# Patient Record
Sex: Male | Born: 1935 | Race: White | Hispanic: No | Marital: Married | Smoking: Former smoker
Health system: Southern US, Community
[De-identification: ages and names within clinical notes are randomized; demographics above are authoritative.]

## PROBLEM LIST (undated history)

## (undated) ENCOUNTER — Emergency Department (HOSPITAL_COMMUNITY): Admission: EM | Payer: BC Managed Care – PPO | Source: Home / Self Care

## (undated) DIAGNOSIS — L723 Sebaceous cyst: Secondary | ICD-10-CM

## (undated) DIAGNOSIS — G319 Degenerative disease of nervous system, unspecified: Secondary | ICD-10-CM

## (undated) DIAGNOSIS — I10 Essential (primary) hypertension: Secondary | ICD-10-CM

## (undated) DIAGNOSIS — R9431 Abnormal electrocardiogram [ECG] [EKG]: Secondary | ICD-10-CM

## (undated) DIAGNOSIS — R410 Disorientation, unspecified: Secondary | ICD-10-CM

## (undated) DIAGNOSIS — Z950 Presence of cardiac pacemaker: Secondary | ICD-10-CM

## (undated) DIAGNOSIS — I509 Heart failure, unspecified: Secondary | ICD-10-CM

## (undated) DIAGNOSIS — K5900 Constipation, unspecified: Secondary | ICD-10-CM

## (undated) DIAGNOSIS — C61 Malignant neoplasm of prostate: Secondary | ICD-10-CM

## (undated) DIAGNOSIS — A419 Sepsis, unspecified organism: Secondary | ICD-10-CM

## (undated) DIAGNOSIS — I679 Cerebrovascular disease, unspecified: Secondary | ICD-10-CM

## (undated) DIAGNOSIS — R1310 Dysphagia, unspecified: Secondary | ICD-10-CM

## (undated) DIAGNOSIS — R269 Unspecified abnormalities of gait and mobility: Secondary | ICD-10-CM

## (undated) DIAGNOSIS — I480 Paroxysmal atrial fibrillation: Secondary | ICD-10-CM

## (undated) DIAGNOSIS — H9190 Unspecified hearing loss, unspecified ear: Secondary | ICD-10-CM

## (undated) DIAGNOSIS — L919 Hypertrophic disorder of the skin, unspecified: Secondary | ICD-10-CM

## (undated) DIAGNOSIS — R5383 Other fatigue: Secondary | ICD-10-CM

## (undated) DIAGNOSIS — G629 Polyneuropathy, unspecified: Secondary | ICD-10-CM

## (undated) DIAGNOSIS — N390 Urinary tract infection, site not specified: Secondary | ICD-10-CM

## (undated) DIAGNOSIS — K648 Other hemorrhoids: Secondary | ICD-10-CM

## (undated) DIAGNOSIS — K5790 Diverticulosis of intestine, part unspecified, without perforation or abscess without bleeding: Secondary | ICD-10-CM

## (undated) DIAGNOSIS — L909 Atrophic disorder of skin, unspecified: Secondary | ICD-10-CM

## (undated) DIAGNOSIS — M4800 Spinal stenosis, site unspecified: Secondary | ICD-10-CM

## (undated) DIAGNOSIS — I359 Nonrheumatic aortic valve disorder, unspecified: Secondary | ICD-10-CM

## (undated) DIAGNOSIS — I442 Atrioventricular block, complete: Secondary | ICD-10-CM

## (undated) DIAGNOSIS — R5381 Other malaise: Secondary | ICD-10-CM

## (undated) DIAGNOSIS — D638 Anemia in other chronic diseases classified elsewhere: Secondary | ICD-10-CM

## (undated) DIAGNOSIS — M1990 Unspecified osteoarthritis, unspecified site: Secondary | ICD-10-CM

## (undated) HISTORY — DX: Anemia in other chronic diseases classified elsewhere: D63.8

## (undated) HISTORY — DX: Dysphagia, unspecified: R13.10

## (undated) HISTORY — DX: Essential (primary) hypertension: I10

## (undated) HISTORY — DX: Heart failure, unspecified: I50.9

## (undated) HISTORY — DX: Sepsis, unspecified organism: A41.9

## (undated) HISTORY — DX: Urinary tract infection, site not specified: N39.0

## (undated) HISTORY — DX: Atrioventricular block, complete: I44.2

## (undated) HISTORY — PX: TONSILLECTOMY: SHX5217

## (undated) HISTORY — DX: Unspecified abnormalities of gait and mobility: R26.9

## (undated) HISTORY — DX: Constipation, unspecified: K59.00

## (undated) HISTORY — DX: Cerebrovascular disease, unspecified: I67.9

## (undated) HISTORY — DX: Degenerative disease of nervous system, unspecified: G31.9

## (undated) HISTORY — DX: Malignant neoplasm of prostate: C61

## (undated) HISTORY — DX: Nonrheumatic aortic valve disorder, unspecified: I35.9

## (undated) HISTORY — PX: NASAL POLYP EXCISION: SHX2068

## (undated) HISTORY — DX: Disorientation, unspecified: R41.0

## (undated) HISTORY — DX: Hypertrophic disorder of the skin, unspecified: L91.9

## (undated) HISTORY — DX: Unspecified hearing loss, unspecified ear: H91.90

## (undated) HISTORY — DX: Unspecified osteoarthritis, unspecified site: M19.90

## (undated) HISTORY — DX: Spinal stenosis, site unspecified: M48.00

## (undated) HISTORY — DX: Polyneuropathy, unspecified: G62.9

## (undated) HISTORY — DX: Sebaceous cyst: L72.3

## (undated) HISTORY — DX: Other hemorrhoids: K64.8

## (undated) HISTORY — DX: Other malaise: R53.81

## (undated) HISTORY — DX: Diverticulosis of intestine, part unspecified, without perforation or abscess without bleeding: K57.90

## (undated) HISTORY — DX: Other fatigue: R53.83

## (undated) HISTORY — DX: Atrophic disorder of skin, unspecified: L90.9

## (undated) HISTORY — DX: Paroxysmal atrial fibrillation: I48.0

## (undated) HISTORY — DX: Abnormal electrocardiogram (ECG) (EKG): R94.31

---

## 1979-06-04 DIAGNOSIS — I359 Nonrheumatic aortic valve disorder, unspecified: Secondary | ICD-10-CM

## 1979-06-04 HISTORY — DX: Nonrheumatic aortic valve disorder, unspecified: I35.9

## 1998-10-23 HISTORY — PX: BACK SURGERY: SHX140

## 1999-01-12 ENCOUNTER — Inpatient Hospital Stay (HOSPITAL_COMMUNITY): Admission: RE | Admit: 1999-01-12 | Discharge: 1999-01-13 | Payer: Self-pay | Admitting: Orthopedic Surgery

## 1999-01-12 ENCOUNTER — Encounter: Payer: Self-pay | Admitting: Orthopedic Surgery

## 1999-06-04 DIAGNOSIS — R269 Unspecified abnormalities of gait and mobility: Secondary | ICD-10-CM

## 1999-06-04 HISTORY — DX: Unspecified abnormalities of gait and mobility: R26.9

## 2001-04-29 ENCOUNTER — Other Ambulatory Visit: Admission: RE | Admit: 2001-04-29 | Discharge: 2001-04-29 | Payer: Self-pay | Admitting: Urology

## 2001-04-29 ENCOUNTER — Encounter (INDEPENDENT_AMBULATORY_CARE_PROVIDER_SITE_OTHER): Payer: Self-pay | Admitting: Specialist

## 2001-05-02 ENCOUNTER — Ambulatory Visit: Admission: RE | Admit: 2001-05-02 | Discharge: 2001-07-31 | Payer: Self-pay | Admitting: Radiation Oncology

## 2001-05-06 ENCOUNTER — Encounter: Payer: Self-pay | Admitting: Urology

## 2001-05-06 ENCOUNTER — Encounter: Admission: RE | Admit: 2001-05-06 | Discharge: 2001-05-06 | Payer: Self-pay | Admitting: Urology

## 2001-05-10 ENCOUNTER — Encounter: Admission: RE | Admit: 2001-05-10 | Discharge: 2001-05-10 | Payer: Self-pay | Admitting: Urology

## 2001-05-10 ENCOUNTER — Encounter: Payer: Self-pay | Admitting: Urology

## 2001-08-01 ENCOUNTER — Ambulatory Visit: Admission: RE | Admit: 2001-08-01 | Discharge: 2001-10-30 | Payer: Self-pay | Admitting: Radiation Oncology

## 2004-07-29 ENCOUNTER — Emergency Department (HOSPITAL_COMMUNITY): Admission: EM | Admit: 2004-07-29 | Discharge: 2004-07-29 | Payer: Self-pay | Admitting: Emergency Medicine

## 2004-08-04 ENCOUNTER — Ambulatory Visit (HOSPITAL_COMMUNITY): Admission: RE | Admit: 2004-08-04 | Discharge: 2004-08-04 | Payer: Self-pay | Admitting: *Deleted

## 2004-08-04 ENCOUNTER — Encounter (INDEPENDENT_AMBULATORY_CARE_PROVIDER_SITE_OTHER): Payer: Self-pay | Admitting: *Deleted

## 2007-02-19 DIAGNOSIS — M1990 Unspecified osteoarthritis, unspecified site: Secondary | ICD-10-CM

## 2007-02-19 DIAGNOSIS — H9190 Unspecified hearing loss, unspecified ear: Secondary | ICD-10-CM

## 2007-02-19 HISTORY — DX: Unspecified hearing loss, unspecified ear: H91.90

## 2007-02-19 HISTORY — DX: Unspecified osteoarthritis, unspecified site: M19.90

## 2007-06-03 DIAGNOSIS — K648 Other hemorrhoids: Secondary | ICD-10-CM

## 2007-06-03 HISTORY — DX: Other hemorrhoids: K64.8

## 2007-07-25 ENCOUNTER — Ambulatory Visit (HOSPITAL_COMMUNITY): Admission: RE | Admit: 2007-07-25 | Discharge: 2007-07-25 | Payer: Self-pay | Admitting: *Deleted

## 2007-07-25 HISTORY — PX: COLONOSCOPY: SHX174

## 2008-01-28 DIAGNOSIS — R9431 Abnormal electrocardiogram [ECG] [EKG]: Secondary | ICD-10-CM

## 2008-01-28 DIAGNOSIS — R5381 Other malaise: Secondary | ICD-10-CM

## 2008-01-28 HISTORY — DX: Abnormal electrocardiogram (ECG) (EKG): R94.31

## 2008-01-28 HISTORY — DX: Other malaise: R53.81

## 2009-10-23 HISTORY — PX: CATARACT EXTRACTION W/ INTRAOCULAR LENS  IMPLANT, BILATERAL: SHX1307

## 2010-01-03 ENCOUNTER — Inpatient Hospital Stay (HOSPITAL_COMMUNITY): Admission: EM | Admit: 2010-01-03 | Discharge: 2010-01-06 | Payer: Self-pay | Admitting: Emergency Medicine

## 2010-01-03 ENCOUNTER — Ambulatory Visit: Payer: Self-pay | Admitting: Cardiology

## 2010-01-03 ENCOUNTER — Encounter: Payer: Self-pay | Admitting: Cardiology

## 2010-01-04 HISTORY — PX: PACEMAKER INSERTION: SHX728

## 2010-01-05 HISTORY — PX: PACEMAKER PLACEMENT: SHX43

## 2010-01-06 ENCOUNTER — Encounter: Payer: Self-pay | Admitting: Internal Medicine

## 2010-01-17 ENCOUNTER — Encounter: Payer: Self-pay | Admitting: Internal Medicine

## 2010-01-17 ENCOUNTER — Telehealth: Payer: Self-pay | Admitting: Internal Medicine

## 2010-01-17 ENCOUNTER — Ambulatory Visit: Payer: Self-pay

## 2010-01-20 ENCOUNTER — Encounter: Payer: Self-pay | Admitting: Cardiology

## 2010-03-01 ENCOUNTER — Encounter: Payer: Self-pay | Admitting: Cardiovascular Disease

## 2010-04-11 ENCOUNTER — Telehealth: Payer: Self-pay | Admitting: Internal Medicine

## 2010-04-11 ENCOUNTER — Ambulatory Visit: Payer: Self-pay | Admitting: Internal Medicine

## 2010-04-11 DIAGNOSIS — I10 Essential (primary) hypertension: Secondary | ICD-10-CM

## 2010-11-22 NOTE — Miscellaneous (Signed)
Summary: Plan of Care  Plan of Care   Imported By: Harlon Flor 03/09/2010 10:41:10  _____________________________________________________________________  External Attachment:    Type:   Image     Comment:   External Document

## 2010-11-22 NOTE — Cardiovascular Report (Signed)
Summary: Office Visit   Office Visit   Imported By: Roderic Ovens 04/15/2010 09:54:54  _____________________________________________________________________  External Attachment:    Type:   Image     Comment:   External Document

## 2010-11-22 NOTE — Assessment & Plan Note (Signed)
Summary: PC2/ GD   Visit Type:  new pt visit Primary Provider:  Murray Hodgkins   History of Present Illness: The patient presents today for routine electrophysiology followup. He reports doing very well since having his pacemaker implanted.  His energy has significantly improved. The patient denies symptoms of palpitations, chest pain, shortness of breath, orthopnea, PND, lower extremity edema, dizziness, presyncope, syncope, or neurologic sequela. The patient is tolerating medications without difficulties and is otherwise without complaint today.   Current Medications (verified): 1)  Amlodipine Besylate 5 Mg Tabs (Amlodipine Besylate) .Marland Kitchen.. 1 Tab Once Daily 2)  Gabapentin 300 Mg Caps (Gabapentin) .Marland Kitchen.. 1 Tab At Bedtime As Needed 3)  Benicar Hct 40-12.5 Mg Tabs (Olmesartan Medoxomil-Hctz) .Marland Kitchen.. 1 Tab Once Daily 4)  Doxazosin Mesylate 2 Mg Tabs (Doxazosin Mesylate) .Marland Kitchen.. 1 Tab Once Daily 5)  Calcium-Vitamin D 600-200 Mg-Unit Tabs (Calcium-Vitamin D) .Marland Kitchen.. 1 Tab Once Daily 6)  Triple Flex Bone & Joint 750-600-100 Mg Tabs (Glucosamine-Chondroit-Calcium) .Marland Kitchen.. 1 Tab 600-400mg  Once Daily 7)  Clobetasol Propionate 0.05 % Crea (Clobetasol Propionate) .... As Needed For Rash 8)  Vitamin D 1000 Unit Tabs (Cholecalciferol) .Marland Kitchen.. 1 Tab Once Daily 9)  Meloxicam 7.5 Mg Tabs (Meloxicam) .Marland Kitchen.. 1 Tab Once Daily As Needed  Allergies (verified): 1)  ! * Resperine 2)  ! Inderal 3)  ! * Hctz 4)  ! Monopril 5)  ! Accupril 6)  ! * Altace 7)  ! * Mobic  Past History:  Past Medical History:  1. Hypertension.   2. History of prostate cancer.   3. Spinal stenosis.   4. Peripheral neuropathy.   5. Diverticulosis and internal hemorrhoids.   6. Mobitz II av block s/p PPM  7. History of vitamin D deficiency.      Past Surgical History: PPM 3/11  Family History: CAD  Social History: He lives in friend's home with his wife.  He was a   Banker at [employer], but is now retired.  He quit   tobacco in 1989 with approximately 40-pack-year history and denies alcohol or drug abuse.  He exercises 3 times a week at exercise classes.   Vital Signs:  Patient profile:   77 year old male Height:      69.5 inches Weight:      207 pounds BMI:     30.24 Pulse rate:   74 / minute Pulse rhythm:   irregular BP sitting:   98 / 64  (left arm) Cuff size:   regular  Vitals Entered By: Danielle Rankin, CMA (April 11, 2010 10:29 AM)  Physical Exam  General:  elderly, NAD Head:  normocephalic and atraumatic Eyes:  PERRLA/EOM intact; conjunctiva and lids normal. Mouth:  Teeth, gums and palate normal. Oral mucosa normal. Neck:  supple Chest Wall:  pacemaker pocket is well healed Lungs:  Clear bilaterally to auscultation and percussion. Heart:  RRR, no m/r/g Abdomen:  Bowel sounds positive; abdomen soft and non-tender without masses, organomegaly, or hernias noted. No hepatosplenomegaly. Msk:  Back normal, normal gait. Muscle strength and tone normal. Pulses:  pulses normal in all 4 extremities Extremities:  No clubbing or cyanosis. Neurologic:  Alert and oriented x 3.   EKG  Procedure date:  04/11/2010  Findings:      sinus rhythm with PACs,  V paced on demand  PPM Specifications Following MD:  Hillis Range, MD     PPM Vendor:  St Jude     PPM Model Number:  [id number]  PPM Serial Number:  [id number] PPM DOI:  01/04/2010     PPM Implanting MD:  Hillis Range, MD  Lead 1    Location: RV     DOI: 01/04/2010     Model #: 1948     Serial #: [id number]     Status: active Lead 2    Location: RA     DOI: 01/04/2010     Model #: 1888TC     Serial #: [id number]     Status: active  Magnet Response Rate:  BOL 100 ERI  85    PPM Follow Up Pacer Dependent:  No      Parameters Mode:  DDD     Lower Rate Limit:  60     Upper Rate Limit:  110 Paced AV Delay:  180     Sensed AV Delay:  160 MD Comments:  agree  Impression & Recommendations:  Problem # 1:  MOBITZ II ATRIOVENTRICULAR BLOCK  (ICD-426.12) normal pacemaker function V threshold has increased but appears stable Ventricular autocapture turned on today  Problem # 2:  ESSENTIAL HYPERTENSION, BENIGN (ICD-401.1) stable no changes today  Patient Instructions: 1)  Your physician recommends that you schedule a follow-up appointment in: March of 2012 2)  Your physician recommends that you continue on your current medications as directed. Please refer to the Current Medication list given to you today.

## 2010-11-22 NOTE — Procedures (Signed)
Summary: [facility]   Allergies: 1)  ! * Resperine 2)  ! Inderal 3)  ! * Hctz 4)  ! Monopril 5)  ! Accupril 6)  ! * Altace 7)  ! * Mobic  PPM Specifications Following MD:  Hillis Range, MD     PPM Vendor:  St Jude     PPM Model Number:  [phone]     PPM Serial Number:  [id number] PPM DOI:  01/04/2010     PPM Implanting MD:  Hillis Range, MD  Lead 1    Location: RV     DOI: 01/04/2010     Model #: 1948     Serial #: [id number]     Status: active Lead 2    Location: RA     DOI: 01/04/2010     Model #: 1888TC     Serial #: [id number]     Status: active  Magnet Response Rate:  BOL 100 ERI  85    PPM Follow Up Battery Voltage:  2.96 V     Battery Est. Longevity:  7.5-8.25yrs     Pacer Dependent:  No       PPM Device Measurements Atrium  Amplitude: 5.0 mV, Impedance: 550 ohms, Threshold: 0.75 V at 0.5 msec Right Ventricle  Amplitude: 7.8 mV, Impedance: 650 ohms, Threshold: 1.75 V at 0.5 msec  Episodes MS Episodes:  31     Percent Mode Switch:  <1%     Ventricular High Rate:  0     Atrial Pacing:  40%     Ventricular Pacing:  92%  Parameters Mode:  DDD     Lower Rate Limit:  60     Upper Rate Limit:  110 Paced AV Delay:  180     Sensed AV Delay:  160 Tech Comments:  31 AMS EPISODES--LONGEST WAS 40 SECONDS.  RV THRESHOLD 1.875 @ 0.28ms. CHANGES: A AMPLITUDE 2.0 V AND TURNED RATE RESPONSE ON DURING MODE SWITCH.  ROV IN 6 MTHS. Vella Kohler  April 11, 2010 11:37 AM

## 2010-11-22 NOTE — Letter (Signed)
Summary: Discharge Summary  Discharge Summary   Imported By: Harlon Flor 02/08/2010 11:16:51  _____________________________________________________________________  External Attachment:    Type:   Image     Comment:   External Document

## 2010-11-22 NOTE — Miscellaneous (Signed)
Summary: Device preload  Clinical Lists Changes  Observations: Added new observation of MAGNET RTE: BOL 100 ERI  85 (01/06/2010 11:49) Added new observation of PPMLEADSTAT2: active (01/06/2010 11:49) Added new observation of PPMLEADSER2: [id number] (01/06/2010 11:49) Added new observation of PPMLEADMOD2: 1888TC (01/06/2010 11:49) Added new observation of PPMLEADLOC2: RA (01/06/2010 11:49) Added new observation of PPMLEADSTAT1: active (01/06/2010 11:49) Added new observation of PPMLEADSER1: [id number] (01/06/2010 11:49) Added new observation of PPMLEADMOD1: 1948  (01/06/2010 11:49) Added new observation of PPMLEADLOC1: RV  (01/06/2010 11:49) Added new observation of PPM IMP MD: Hillis Range, MD  (01/06/2010 11:49) Added new observation of PPMLEADDOI2: 01/04/2010  (01/06/2010 11:49) Added new observation of PPMLEADDOI1: 01/04/2010  (01/06/2010 11:49) Added new observation of PPM DOI: 01/04/2010  (01/06/2010 11:49) Added new observation of PPM SERL#: [id number]  (01/06/2010 11:49) Added new observation of PPM MODL#: [id number]  (01/06/2010 78:46) Added new observation of PACEMAKERMFG: St Jude  (01/06/2010 11:49) Added new observation of PACEMAKER MD: Hillis Range, MD  (01/06/2010 11:49)      PPM Specifications Following MD:  Hillis Range, MD     PPM Vendor:  St Jude     PPM Model Number:  [id number]     PPM Serial Number:  [id number] PPM DOI:  01/04/2010     PPM Implanting MD:  Hillis Range, MD  Lead 1    Location: RV     DOI: 01/04/2010     Model #: 1948     Serial #: [id number]     Status: active Lead 2    Location: RA     DOI: 01/04/2010     Model #: 1888TC     Serial #: [id number]     Status: active  Magnet Response Rate:  BOL 100 ERI  85

## 2010-11-22 NOTE — Progress Notes (Signed)
Summary: medication down wrong in chart  Phone Note Call from Patient   Caller: Daughter Reason for Call: Talk to Nurse Summary of Call: dtr calling re appt today-medication is wrong on the gabapentin it should be 100mg  not 300mg  Initial call taken by: Glynda Jaeger,  April 11, 2010 12:28 PM    New/Updated Medications: GABAPENTIN 100 MG CAPS (GABAPENTIN)

## 2010-11-22 NOTE — Procedures (Signed)
Summary: [facility]/ [facility]/ GD   Allergies (verified): No Known Drug Allergies  PPM Specifications Following MD:  Hillis Range, MD     PPM Vendor:  St Jude     PPM Model Number:  [id number]     PPM Serial Number:  [id number] PPM DOI:  01/04/2010     PPM Implanting MD:  Hillis Range, MD  Lead 1    Location: RV     DOI: 01/04/2010     Model #: 1948     Serial #: [id number]     Status: active Lead 2    Location: RA     DOI: 01/04/2010     Model #: 1888TC     Serial #: [id number]     Status: active  Magnet Response Rate:  BOL 100 ERI  85    PPM Follow Up Remote Check?  No Battery Voltage:  3.20 V     Battery Est. Longevity:  6.8 years     Pacer Dependent:  No       PPM Device Measurements Atrium  Amplitude: 5.0 mV, Impedance: 380 ohms, Threshold: 0.5 V at 0.5 msec Right Ventricle  Amplitude: 5.4 mV, Impedance: 550 ohms, Threshold: 0.625 V at 0.5 msec  Episodes MS Episodes:  3     Percent Mode Switch:  <1%     Atrial Pacing:  32%     Ventricular Pacing:  94%  Parameters Mode:  DDD     Lower Rate Limit:  60     Upper Rate Limit:  110 Paced AV Delay:  180     Sensed AV Delay:  160 Tech Comments:  Steri strips removed, no redenss or edema.  Longest mode switch 10 seconds.  R-waves 8.9 @ implant, 5.4 today, impedance and threshold stable.  Unable to check medications, no list available.  ROV 3 months Dr. Johney Frame. Altha Harm, LPN  January 17, 2010 9:59 AM

## 2010-11-22 NOTE — Progress Notes (Signed)
Summary: QUESTIONA BOUT MEDICATION  Phone Note Call from Patient Call back at Home Phone [phone] Call back at [phone]   Caller: Son/RANDY Summary of Call: PT SON HAVE QUESTIONA ABOUT MEDICATION( AMLODIFINE BESYLATE) Initial call taken by: Judie Grieve,  January 17, 2010 3:35 PM  Follow-up for Phone Call        take Amlodipine until his f/u with Dr Johney Frame Dennis Bast, RN, BSN  January 17, 2010 4:42 PM

## 2010-11-22 NOTE — Cardiovascular Report (Signed)
Summary: Office Visit   Office Visit   Imported By: Roderic Ovens 01/28/2010 13:26:13  _____________________________________________________________________  External Attachment:    Type:   Image     Comment:   External Document

## 2010-12-28 ENCOUNTER — Encounter: Payer: Self-pay | Admitting: Internal Medicine

## 2010-12-28 DIAGNOSIS — G609 Hereditary and idiopathic neuropathy, unspecified: Secondary | ICD-10-CM | POA: Insufficient documentation

## 2010-12-28 DIAGNOSIS — C61 Malignant neoplasm of prostate: Secondary | ICD-10-CM | POA: Insufficient documentation

## 2010-12-28 DIAGNOSIS — K648 Other hemorrhoids: Secondary | ICD-10-CM | POA: Insufficient documentation

## 2010-12-28 DIAGNOSIS — K5730 Diverticulosis of large intestine without perforation or abscess without bleeding: Secondary | ICD-10-CM | POA: Insufficient documentation

## 2010-12-28 DIAGNOSIS — I447 Left bundle-branch block, unspecified: Secondary | ICD-10-CM | POA: Insufficient documentation

## 2010-12-28 DIAGNOSIS — M4800 Spinal stenosis, site unspecified: Secondary | ICD-10-CM | POA: Insufficient documentation

## 2010-12-28 DIAGNOSIS — E559 Vitamin D deficiency, unspecified: Secondary | ICD-10-CM | POA: Insufficient documentation

## 2010-12-29 ENCOUNTER — Encounter (INDEPENDENT_AMBULATORY_CARE_PROVIDER_SITE_OTHER): Payer: Medicare Other | Admitting: Internal Medicine

## 2010-12-29 ENCOUNTER — Encounter: Payer: Self-pay | Admitting: Internal Medicine

## 2010-12-29 DIAGNOSIS — I441 Atrioventricular block, second degree: Secondary | ICD-10-CM

## 2010-12-29 DIAGNOSIS — I10 Essential (primary) hypertension: Secondary | ICD-10-CM

## 2010-12-31 DIAGNOSIS — R011 Cardiac murmur, unspecified: Secondary | ICD-10-CM | POA: Insufficient documentation

## 2011-01-03 NOTE — Assessment & Plan Note (Signed)
Summary: device/saf/kl   Visit Type:  Follow-up Primary Provider:  Murray Hodgkins   History of Present Illness: The patient presents today for routine electrophysiology followup. He reports doing very well since having his pacemaker implanted.  He remains very active despite his age.  The patient denies symptoms of palpitations, chest pain, shortness of breath, orthopnea, PND, lower extremity edema, dizziness, presyncope, syncope, or neurologic sequela. The patient is tolerating medications without difficulties and is otherwise without complaint today.   Current Medications (verified): 1)  Gabapentin 100 Mg Caps (Gabapentin) .Marland Kitchen.. 1 By Mouth At Bedtime 2)  Benicar Hct 40-12.5 Mg Tabs (Olmesartan Medoxomil-Hctz) .Marland Kitchen.. 1 Tab Once Daily 3)  Doxazosin Mesylate 2 Mg Tabs (Doxazosin Mesylate) .Marland Kitchen.. 1 Tab Once Daily 4)  Calcium-Vitamin D 600-200 Mg-Unit Tabs (Calcium-Vitamin D) .Marland Kitchen.. 1 Tab Once Daily 5)  Clobetasol Propionate 0.05 % Crea (Clobetasol Propionate) .... As Needed For Rash 6)  Vitamin D 1000 Unit Tabs (Cholecalciferol) .Marland Kitchen.. 1 Tab Once Daily 7)  Glucosamine 500 Mg Caps (Glucosamine Sulfate) .Marland Kitchen.. 1 By Mouth Daily 8)  Aspirin 81 Mg  Tabs (Aspirin) .Marland Kitchen.. 1 By Mouth Daily  Allergies (verified): 1)  ! * Resperine 2)  ! Inderal 3)  ! * Hctz 4)  ! Monopril 5)  ! Accupril 6)  ! * Altace 7)  ! * Mobic  Past History:  Past Medical History: ESSENTIAL HYPERTENSION, BENIGN PERIPHERAL NEUROPATHY SPINAL STENOSIS PROSTATE CANCER Diverticulosis and internal hemorrhoids.  Mobitz II av block s/p PPM      Past Surgical History: Reviewed history from 12/28/2010 and no changes required. PPM 3/11  History of prostate cancer.   History of back surgery.  Colonoscopy.    Social History: Reviewed history from 04/11/2010 and no changes required. He lives in friend's home with his wife.  He was a   Banker at [employer], but is now retired.  He quit  tobacco in 1989 with  approximately 40-pack-year history and denies alcohol or drug abuse.  He exercises 3 times a week at exercise classes.   Vital Signs:  Patient profile:   77 year old male Height:      69.5 inches Weight:      200 pounds BMI:     29.22 Pulse rate:   48 / minute Resp:     18 per minute BP sitting:   118 / 64  (left arm)  Vitals Entered By: Marrion Coy, CNA (December 29, 2010 12:34 PM)  Physical Exam  General:  Well developed, well nourished, in no acute distress. Head:  normocephalic and atraumatic Eyes:  PERRLA/EOM intact; conjunctiva and lids normal. Mouth:  Teeth, gums and palate normal. Oral mucosa normal. Neck:  Neck supple, no JVD. No masses, thyromegaly or abnormal cervical nodes. Chest Wall:  pacemaker pocket is well healed Lungs:  Clear bilaterally to auscultation and percussion. Heart:  RRR, 3/6 SEM LUSB which is mid peaking Abdomen:  Bowel sounds positive; abdomen soft and non-tender without masses, organomegaly, or hernias noted. No hepatosplenomegaly. Msk:  Back normal, normal gait. Muscle strength and tone normal. Extremities:  No clubbing or cyanosis. Neurologic:  Alert and oriented x 3. Skin:  Intact without lesions or rashes. Psych:  Normal affect.   PPM Specifications Following MD:  Hillis Range, MD     PPM Vendor:  St Jude     PPM Model Number:  [id number]     PPM Serial Number:  [id number] PPM DOI:  01/04/2010     PPM Implanting MD:  Hillis Range, MD  Lead 1    Location: RV     DOI: 01/04/2010     Model #: 1948     Serial #: [id number]     Status: active Lead 2    Location: RA     DOI: 01/04/2010     Model #: 1888TC     Serial #: [id number]     Status: active  Magnet Response Rate:  BOL 100 ERI  85    PPM Follow Up Pacer Dependent:  No      Parameters Mode:  DDD     Lower Rate Limit:  60     Upper Rate Limit:  110 Paced AV Delay:  180     Sensed AV Delay:  160 MD Comments:  see scanned report in PACEART  Impression & Recommendations:  Problem # 1:  MOBITZ  II ATRIOVENTRICULAR BLOCK (ICD-426.12) normal pacemaker function V threshold is stable but chronically elevated see scanned report in paceart  Problem # 2:  ESSENTIAL HYPERTENSION, BENIGN (ICD-401.1) stable no changes  Problem # 3:  SYSTOLIC MURMUR (VHQ-469.6) by exam, the patient likely has aortic stenosis.  The mumur is not late peaking and the patient has no symptoms of aortic stenosis.  I suggested that we could perform an echocardiogram to further evaluate this.  Given his advanced age, the patient is reluctant to have an echo obtained.  As he is asymptomatic, I think that this is a reasonable approach.  He will further contemplate echo and may be willing to proceed upon next office visit.  He will alert my office if he develops symptoms of CHF, CP, or presyncope in the interim.  Patient Instructions: 1)  Your physician wants you to follow-up in: 6 months with Dr.Aleana Fifita  You will receive a reminder letter in the mail two months in advance. If you don't receive a letter, please call our office to schedule the follow-up appointment.

## 2011-01-15 LAB — CARDIAC PANEL(CRET KIN+CKTOT+MB+TROPI)
CK, MB: 1.6 ng/mL (ref 0.3–4.0)
Relative Index: INVALID (ref 0.0–2.5)
Relative Index: INVALID (ref 0.0–2.5)
Total CK: 58 U/L (ref 7–232)
Total CK: 60 U/L (ref 7–232)
Troponin I: 0.08 ng/mL — ABNORMAL HIGH (ref 0.00–0.06)

## 2011-01-15 LAB — CBC
HCT: 34.6 % — ABNORMAL LOW (ref 39.0–52.0)
Hemoglobin: 12.1 g/dL — ABNORMAL LOW (ref 13.0–17.0)
MCV: 95.6 fL (ref 78.0–100.0)
MCV: 95.8 fL (ref 78.0–100.0)
Platelets: 128 10*3/uL — ABNORMAL LOW (ref 150–400)
RBC: 3.62 MIL/uL — ABNORMAL LOW (ref 4.22–5.81)
RBC: 3.88 MIL/uL — ABNORMAL LOW (ref 4.22–5.81)
WBC: 7 10*3/uL (ref 4.0–10.5)
WBC: 8.3 10*3/uL (ref 4.0–10.5)

## 2011-01-15 LAB — POCT I-STAT, CHEM 8
BUN: 37 mg/dL — ABNORMAL HIGH (ref 6–23)
Calcium, Ion: 1.27 mmol/L (ref 1.12–1.32)
Creatinine, Ser: 1 mg/dL (ref 0.4–1.5)
Glucose, Bld: 103 mg/dL — ABNORMAL HIGH (ref 70–99)
Hemoglobin: 12.9 g/dL — ABNORMAL LOW (ref 13.0–17.0)
Sodium: 142 mEq/L (ref 135–145)
TCO2: 26 mmol/L (ref 0–100)

## 2011-01-15 LAB — BASIC METABOLIC PANEL
BUN: 29 mg/dL — ABNORMAL HIGH (ref 6–23)
CO2: 25 mEq/L (ref 19–32)
CO2: 26 mEq/L (ref 19–32)
Chloride: 109 mEq/L (ref 96–112)
Creatinine, Ser: 1 mg/dL (ref 0.4–1.5)
GFR calc Af Amer: >60 mL/min (ref >60)
Glucose, Bld: 101 mg/dL — ABNORMAL HIGH (ref 70–99)
Glucose, Bld: 95 mg/dL (ref 70–99)
Potassium: 4 mEq/L (ref 3.5–5.1)
Sodium: 140 mEq/L (ref 135–145)
Sodium: 142 mEq/L (ref 135–145)

## 2011-01-15 LAB — COMPREHENSIVE METABOLIC PANEL
Albumin: 2.9 g/dL — ABNORMAL LOW (ref 3.5–5.2)
Alkaline Phosphatase: 41 U/L (ref 39–117)
BUN: 32 mg/dL — ABNORMAL HIGH (ref 6–23)
CO2: 25 mEq/L (ref 19–32)
Chloride: 112 mEq/L (ref 96–112)
Creatinine, Ser: 1.1 mg/dL (ref 0.4–1.5)
GFR calc non Af Amer: >60 mL/min (ref >60)
Glucose, Bld: 94 mg/dL (ref 70–99)
Potassium: 3.9 mEq/L (ref 3.5–5.1)
Total Bilirubin: 1.1 mg/dL (ref 0.3–1.2)

## 2011-01-15 LAB — LIPID PANEL
Cholesterol: 111 mg/dL (ref 0–200)
LDL Cholesterol: 72 mg/dL (ref 0–99)
Total CHOL/HDL Ratio: 3.6 RATIO
Triglycerides: 42 mg/dL (ref <150)

## 2011-01-15 LAB — DIFFERENTIAL
Basophils Absolute: 0 10*3/uL (ref 0.0–0.1)
Basophils Relative: 1 % (ref 0–1)
Eosinophils Relative: 3 % (ref 0–5)
Lymphocytes Relative: 13 % (ref 12–46)
Monocytes Absolute: 0.7 10*3/uL (ref 0.1–1.0)

## 2011-01-15 LAB — MRSA PCR SCREENING: MRSA by PCR: NEGATIVE

## 2011-01-15 LAB — PROTIME-INR
Prothrombin Time: 14.1 seconds (ref 11.6–15.2)
Prothrombin Time: 14.6 seconds (ref 11.6–15.2)

## 2011-01-15 LAB — HEMOGLOBIN A1C: Mean Plasma Glucose: 123 mg/dL

## 2011-01-19 NOTE — Cardiovascular Report (Signed)
Summary: Office Visit   Office Visit   Imported By: Roderic Ovens 01/13/2011 14:39:10  _____________________________________________________________________  External Attachment:    Type:   Image     Comment:   External Document

## 2011-02-15 IMAGING — CR DG CHEST 2V
2 series · 2 of 2 positions shown · non-contrast
Comparison: 01/03/2010

CLINICAL DATA: Status post pacer placement

CHEST - 2 VIEW

[w chest pa]
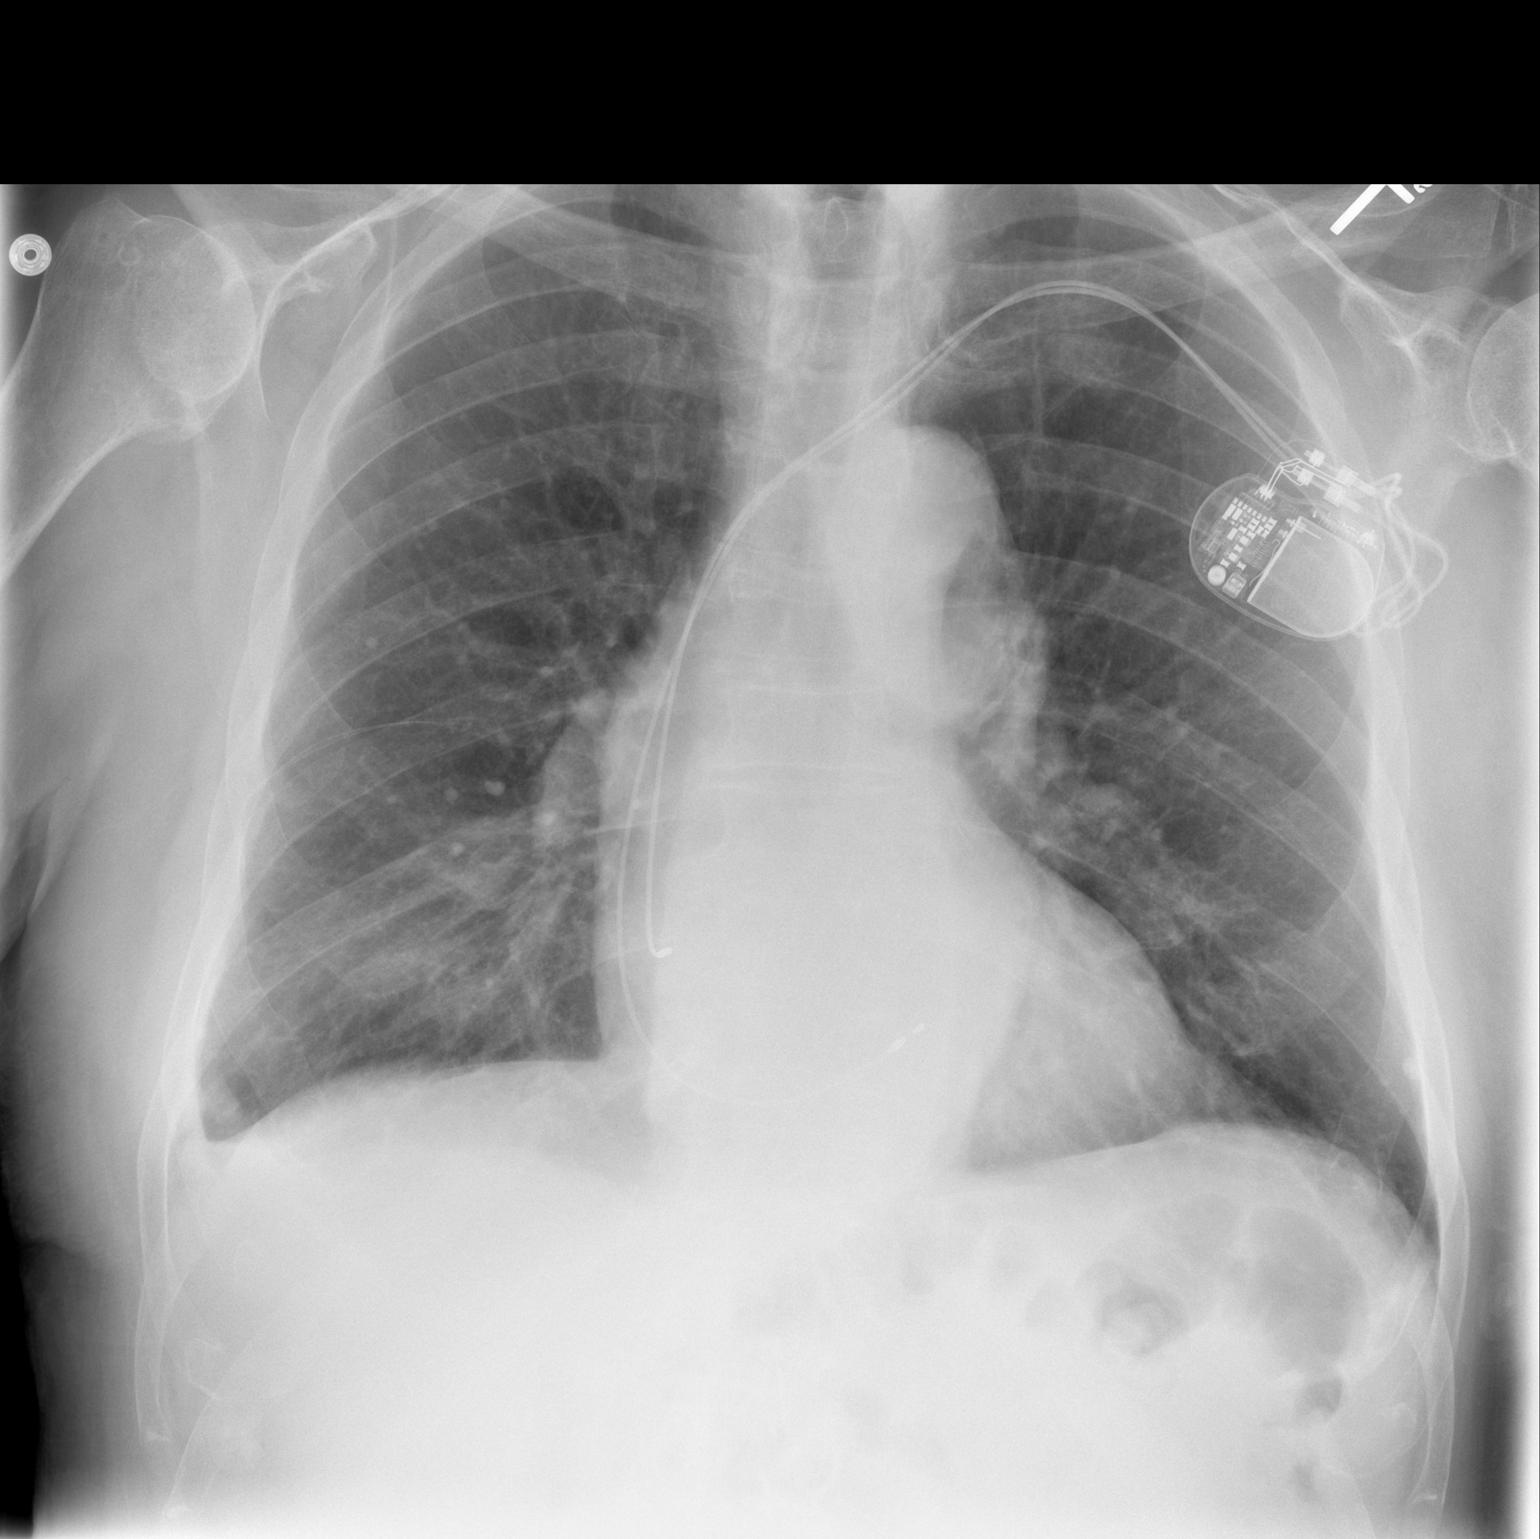

[w chest lat]
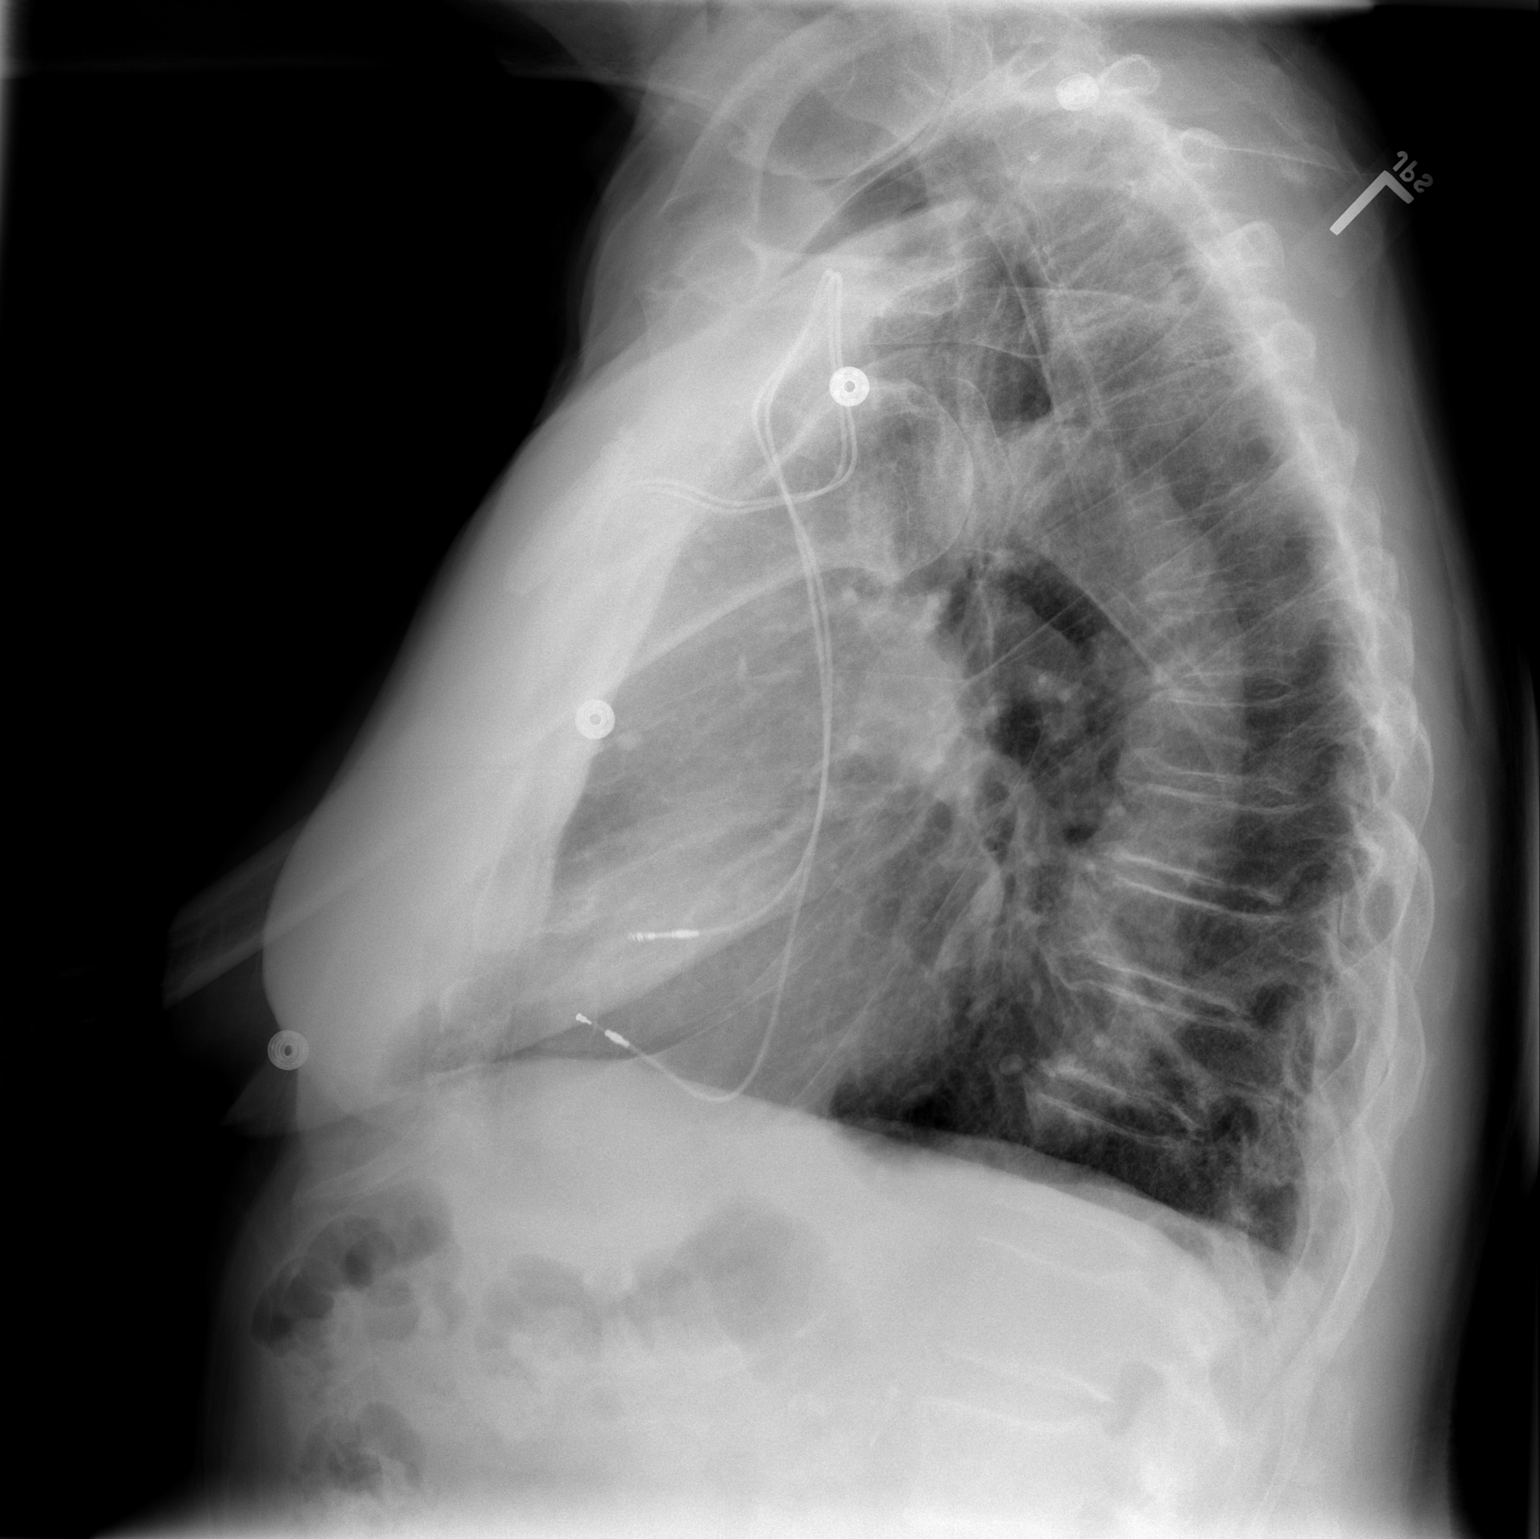

[2 of 2 positions shown; findings below may reference images not displayed]

FINDINGS: There is a left chest wall pacer device with leads in the
right atrial appendage and right ventricle.

No pneumothorax is identified.

Heart size is normal.

There is a small right effusion

No significant interstitial edema or airspace disease.
IMPRESSION: 1.  No pneumothorax after pacer placement.
2.  Small right effusion.

## 2011-02-16 IMAGING — CR DG CHEST 2V
2 series · 2 of 2 positions shown · non-contrast
Comparison: 01/05/2010.

CLINICAL DATA: Pacemaker placement.

CHEST - 2 VIEW

[w chest pa]
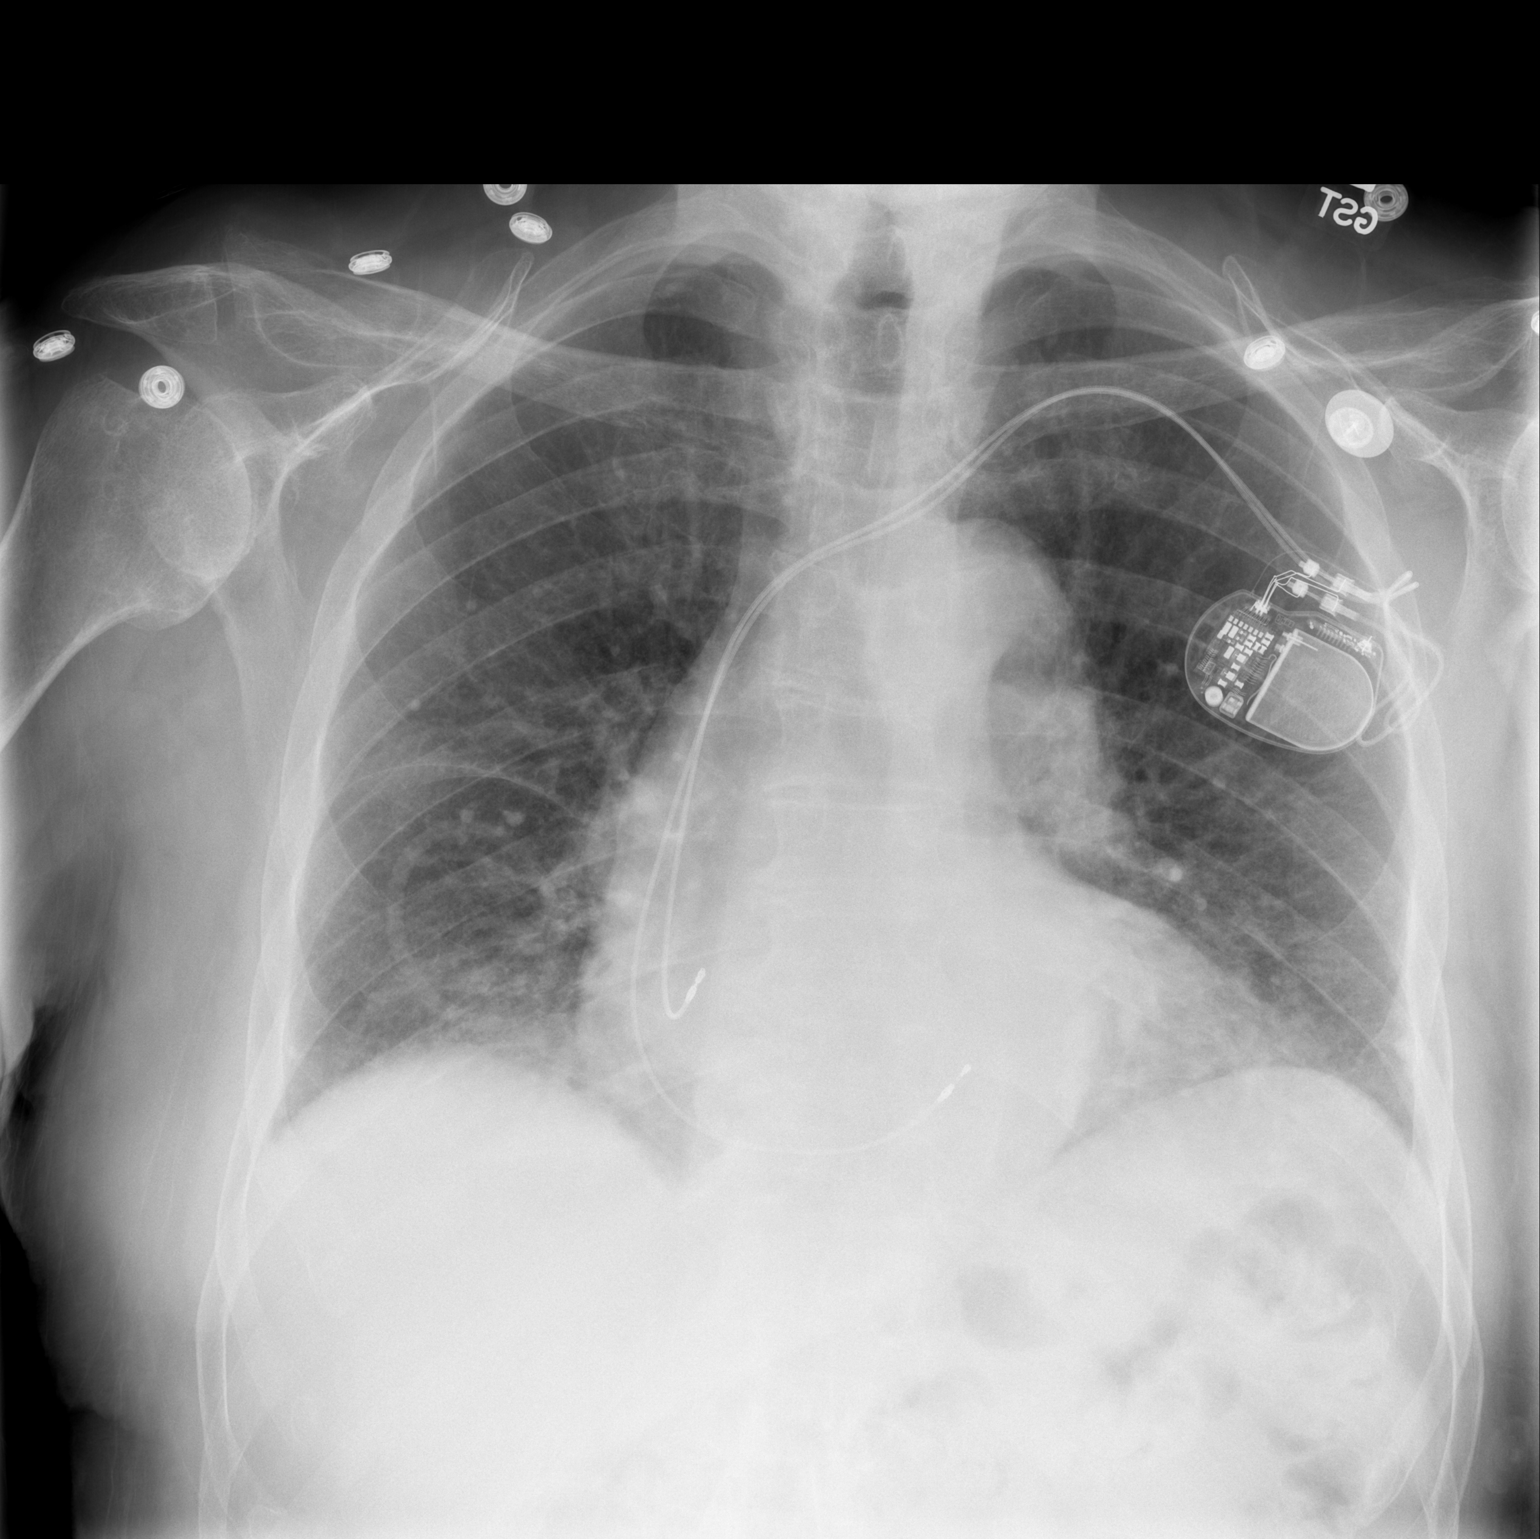

[w chest lat]
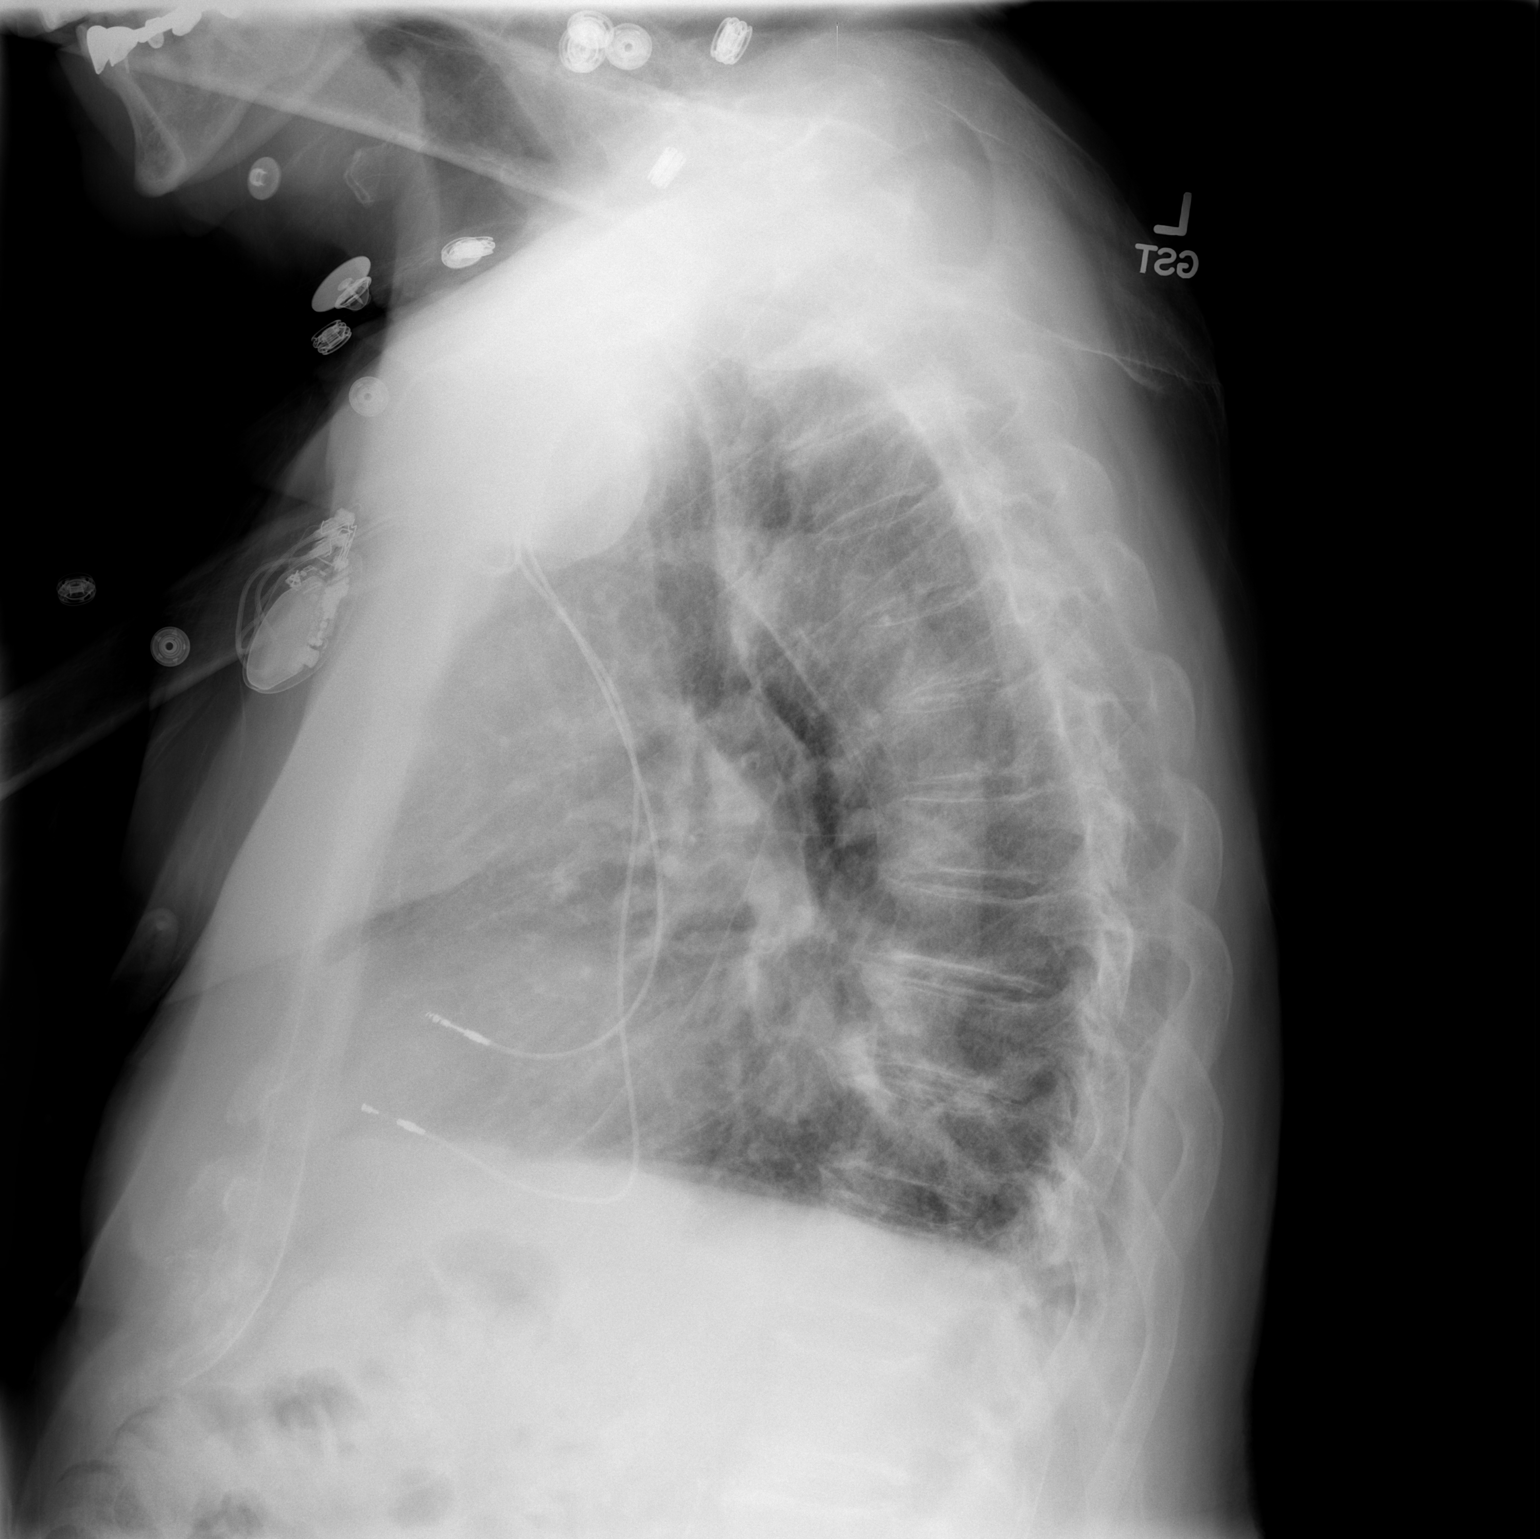

[2 of 2 positions shown; findings below may reference images not displayed]

FINDINGS: Dual lead pacemaker is unchanged with leads in the right
atrium and right ventricle.  There is no pneumothorax.

Mild bibasilar atelectasis has developed since the prior study.
There is no heart failure or edema.  No significant effusion.
IMPRESSION: Interval increase in bibasilar atelectasis due to hypoventilation.

## 2011-03-07 NOTE — Op Note (Signed)
NAMEZAYVEON, RASCHKE                  ACCOUNT NO.:  [id number]   MEDICAL RECORD NO.:  [id number]          PATIENT TYPE:  AMB   LOCATION:  ENDO                         FACILITY:  [facility]   PHYSICIAN:  Georgiana Spinner, M.D.    DATE OF BIRTH:  [birth year]   DATE OF PROCEDURE:  07/25/2007  DATE OF DISCHARGE:                               OPERATIVE REPORT   PROCEDURE:  Colonoscopy.   ENDOSCOPIST:  Georgiana Spinner, M.D.   INDICATIONS:  Rectal bleeding.   ANESTHESIA:  Fentanyl 50 mcg, Versed 5 mg.   PROCEDURE:  With the patient mildly sedated in the left lateral  decubitus position, a rectal examination was performed, which was  unremarkable.  Subsequently, the Pentax videoscopic colonoscope was  inserted in the rectum and passed under direct vision to the cecum,  identified by ileocecal valve and base of cecum, both which were  photographed.  From this point, the colonoscope was slowly withdrawn,  taking circumferential views of the colonic mucosa, stopping in the  rectum, which appeared normal on direct and showed hemorrhoids on  retroflexed view.  The endoscope was straightened and withdrawn.  The  patient's vital signs and pulse oximetry remained stable.  The patient  tolerated procedure well without apparent complications.   FINDINGS:  Diverticulosis scattered throughout the colon, fairly mild to  moderate, and internal hemorrhoids, otherwise an unremarkable exam.   PLAN:  Have the patient follow up with me as needed.           ______________________________  Georgiana Spinner, M.D.     GMO/MEDQ  D:  07/25/2007  T:  07/26/2007  Job:  [id number]

## 2011-03-10 NOTE — Op Note (Signed)
NAMECALUM, CORMIER                  ACCOUNT NO.:  [id number]   MEDICAL RECORD NO.:  [id number]          PATIENT TYPE:  AMB   LOCATION:  ENDO                         FACILITY:  [facility]   PHYSICIAN:  Georgiana Spinner, M.D.    DATE OF BIRTH:  04/14/1916   DATE OF PROCEDURE:  DATE OF DISCHARGE:                                 OPERATIVE REPORT   PROCEDURE:  Colonoscopy with biopsy.   SURGEON:   INDICATIONS FOR PROCEDURE:  Rectal bleeding.   ANESTHESIA:  Demerol 30 mg and Versed 3 mg.   DESCRIPTION OF PROCEDURE:  With the patient mildly sedated in the left  lateral decubitus position, the Olympus videoscopic colonoscope was inserted  into the rectum and passed under direct vision to the cecum, identified by  ileocecal valve and appendiceal orifice, both of which were photographed.  Attached to the ileocecal valve was approximately 1 cm long polyp that was  photographed and then removed using hot biopsy forceps technique first to  remove some of the polyp tissue and then to eradicate the remainder using  the tip of the forceps to apply current.  Once this had been done  satisfactorily, the colonoscope was slowly withdrawn taking circumferential  views of the colonic mucosa, stopping only then in the rectum which appeared  normal on direct and showed hemorrhoids on retroflexed view.  The endoscope  was straightened and withdrawn.  The patient's vital signs and pulse  oximeter remained stable.  The patient tolerated the procedure well without  apparent complications.   FINDINGS:  1.  Internal hemorrhoids.  2.  Scattered diverticula throughout the colon.  3.  Polyp about the ileocecal valve.   PLAN:  Await biopsy report.  The patient will call me for results and follow  up with me as an outpatient.   Of note, the patient got preoperative ampicillin and gentamicin       GMO/MEDQ  D:  08/04/2004  T:  08/04/2004  Job:  [id number]

## 2011-03-30 ENCOUNTER — Ambulatory Visit (INDEPENDENT_AMBULATORY_CARE_PROVIDER_SITE_OTHER): Payer: Medicare Other | Admitting: *Deleted

## 2011-03-30 DIAGNOSIS — I441 Atrioventricular block, second degree: Secondary | ICD-10-CM

## 2011-04-02 ENCOUNTER — Other Ambulatory Visit: Payer: Self-pay | Admitting: Internal Medicine

## 2011-04-06 NOTE — Progress Notes (Signed)
Pacer remote check  

## 2011-04-13 ENCOUNTER — Encounter: Payer: Self-pay | Admitting: *Deleted

## 2011-06-15 ENCOUNTER — Encounter: Payer: Self-pay | Admitting: Internal Medicine

## 2011-06-29 ENCOUNTER — Encounter: Payer: BC Managed Care – PPO | Admitting: *Deleted

## 2011-06-29 ENCOUNTER — Ambulatory Visit (INDEPENDENT_AMBULATORY_CARE_PROVIDER_SITE_OTHER): Payer: Medicare Other | Admitting: *Deleted

## 2011-06-29 ENCOUNTER — Other Ambulatory Visit: Payer: Self-pay

## 2011-06-29 ENCOUNTER — Encounter: Payer: Self-pay | Admitting: Internal Medicine

## 2011-06-29 ENCOUNTER — Encounter: Payer: Medicare Other | Admitting: *Deleted

## 2011-06-29 DIAGNOSIS — I441 Atrioventricular block, second degree: Secondary | ICD-10-CM

## 2011-06-29 LAB — REMOTE PACEMAKER DEVICE
ATRIAL PACING PM: 28
BRDY-0002RV: 60 {beats}/min
BRDY-0004RV: 110 {beats}/min
DEVICE MODEL PM: 7126761
VENTRICULAR PACING PM: 90

## 2011-07-10 ENCOUNTER — Encounter: Payer: Self-pay | Admitting: *Deleted

## 2011-07-10 NOTE — Progress Notes (Signed)
Pacer remote check  

## 2011-08-03 ENCOUNTER — Telehealth: Payer: Self-pay | Admitting: *Deleted

## 2011-08-03 NOTE — Telephone Encounter (Signed)
Pt wants to know if he can move his monitor. He is moving furniture please call

## 2011-08-04 NOTE — Telephone Encounter (Signed)
Pt.notified

## 2011-09-28 ENCOUNTER — Ambulatory Visit (INDEPENDENT_AMBULATORY_CARE_PROVIDER_SITE_OTHER): Payer: Medicare Other | Admitting: *Deleted

## 2011-09-28 ENCOUNTER — Telehealth: Payer: Self-pay | Admitting: *Deleted

## 2011-09-28 DIAGNOSIS — I441 Atrioventricular block, second degree: Secondary | ICD-10-CM

## 2011-09-28 NOTE — Telephone Encounter (Signed)
Patient called wanting to verify that we received remote transmission.  Advised patient we had received and we would mail a letter after Dr Johney Frame reviewed.  Patient was concerned because Dr Johney Frame said he needed follow up on his valve.  Reminder was in system for March of 2013, that appointment was scheduled today and patient aware.  Not having any problems at this time.  Gypsy Balsam, RN, BSN 09/28/2011 11:13 AM

## 2011-09-29 ENCOUNTER — Other Ambulatory Visit: Payer: Self-pay | Admitting: Internal Medicine

## 2011-09-29 ENCOUNTER — Encounter: Payer: Self-pay | Admitting: Internal Medicine

## 2011-09-29 LAB — REMOTE PACEMAKER DEVICE
ATRIAL PACING PM: 26
BAMS-0001: 150 {beats}/min
BAMS-0003: 70 {beats}/min
DEVICE MODEL PM: 7126761
VENTRICULAR PACING PM: 90

## 2011-10-10 NOTE — Progress Notes (Signed)
Remote pacer check  

## 2011-10-19 ENCOUNTER — Encounter: Payer: Self-pay | Admitting: *Deleted

## 2011-11-20 ENCOUNTER — Encounter: Payer: Self-pay | Admitting: Internal Medicine

## 2011-11-20 ENCOUNTER — Ambulatory Visit (INDEPENDENT_AMBULATORY_CARE_PROVIDER_SITE_OTHER): Payer: Medicare Other | Admitting: Internal Medicine

## 2011-11-20 VITALS — BP 125/78 | HR 77 | Wt 208.0 lb

## 2011-11-20 DIAGNOSIS — I4891 Unspecified atrial fibrillation: Secondary | ICD-10-CM | POA: Insufficient documentation

## 2011-11-20 DIAGNOSIS — R011 Cardiac murmur, unspecified: Secondary | ICD-10-CM

## 2011-11-20 DIAGNOSIS — I441 Atrioventricular block, second degree: Secondary | ICD-10-CM

## 2011-11-20 DIAGNOSIS — I10 Essential (primary) hypertension: Secondary | ICD-10-CM

## 2011-11-20 LAB — BASIC METABOLIC PANEL
Calcium: 9.2 mg/dL (ref 8.4–10.5)
Chloride: 108 mEq/L (ref 96–112)
Creatinine, Ser: 1 mg/dL (ref 0.4–1.5)

## 2011-11-20 LAB — CBC WITH DIFFERENTIAL/PLATELET
Basophils Absolute: 0 10*3/uL (ref 0.0–0.1)
Eosinophils Absolute: 0.2 10*3/uL (ref 0.0–0.7)
Hemoglobin: 12.4 g/dL — ABNORMAL LOW (ref 13.0–17.0)
Lymphocytes Relative: 12.7 % (ref 12.0–46.0)
Lymphs Abs: 1 10*3/uL (ref 0.7–4.0)
MCHC: 35.1 g/dL (ref 30.0–36.0)
Neutro Abs: 5.6 10*3/uL (ref 1.4–7.7)
Platelets: 174 10*3/uL (ref 150.0–400.0)
RDW: 13.3 % (ref 11.5–14.6)

## 2011-11-20 LAB — PACEMAKER DEVICE OBSERVATION
AL AMPLITUDE: 4.7 mv
AL THRESHOLD: 0.75 V
BAMS-0001: 150 {beats}/min
BATTERY VOLTAGE: 2.9328 V
DEVICE MODEL PM: 7126761
RV LEAD AMPLITUDE: 6.3 mv

## 2011-11-20 MED ORDER — RIVAROXABAN 15 MG PO TABS: 15.0000 mg | ORAL_TABLET | Freq: Every day | ORAL | Status: DC

## 2011-11-20 NOTE — Progress Notes (Signed)
The patient presents today for routine electrophysiology followup.  Since last being seen in our clinic, the patient reports doing reasonalbly well.   He remains very active despite his age. Today, he denies symptoms of palpitations, chest pain, shortness of breath, orthopnea, PND, lower extremity edema, dizziness, presyncope, syncope, or neurologic sequela.  The patient feels that he is tolerating medications without difficulties and is otherwise without complaint today.   Past Medical History  Diagnosis Date  . Hypertension   . Prostate cancer   . Spinal stenosis   . Peripheral neuropathy   . Diverticulosis   . Complete heart block     s/p PPM  . Vitamin D deficiency   . Pacemaker     PPM- St Jude  . Paroxysmal atrial fibrillation    Past Surgical History  Procedure Date  . Pacemaker insertion     PPM- St Jude-3/11    Current Outpatient Prescriptions  Medication Sig Dispense Refill  . amLODipine (NORVASC) 10 MG tablet Take 10 mg by mouth daily.        . calcium-vitamin D 250-100 MG-UNIT per tablet Take 1 tablet by mouth 2 (two) times daily.        . Cholecalciferol (VITAMIN D) 1000 UNITS capsule Take 1,000 Units by mouth daily.        . Clobetasol Prop Emollient Base 0.05 % emollient cream Apply topically 2 (two) times daily.        Marland Kitchen doxazosin (CARDURA) 2 MG tablet Take 2 mg by mouth at bedtime.        . gabapentin (NEURONTIN) 100 MG capsule Take 100 mg by mouth 3 (three) times daily.        . Glucosamine-Chondroit-Calcium (TRIPLE FLEX BONE & JOINT) 750-600-100 MG TABS Take by mouth. 1 po daily       . meloxicam (MOBIC) 7.5 MG tablet Take 7.5 mg by mouth as needed.        Marland Kitchen olmesartan-hydrochlorothiazide (BENICAR HCT) 40-12.5 MG per tablet Take 1 tablet by mouth daily.        . Rivaroxaban (XARELTO) 15 MG TABS tablet Take 1 tablet (15 mg total) by mouth daily.  90 tablet  1    Allergies  Allergen Reactions  . Fosinopril Sodium   . Meloxicam   . Propranolol Hcl   .  Quinapril Hcl   . Ramipril     History   Social History  . Marital Status: Married    Spouse Name: N/A    Number of Children: N/A  . Years of Education: N/A   Occupational History  . marketing executive--retired     [employer]   Social History Main Topics  . Smoking status: Former Smoker -- 40 years    Quit date: 10/24/1987  . Smokeless tobacco: Not on file  . Alcohol Use: No  . Drug Use: No  . Sexually Active: Not on file   Other Topics Concern  . Not on file   Social History Narrative   Exercises 3 times  A week at exercise class.    Family History  Problem Relation Age of Onset  . Coronary artery disease      family history    ROS-  All systems are reviewed and are negative except as outlined in the HPI above   Physical Exam: Filed Vitals:   11/20/11 1357  BP: 125/78  Pulse: 77  Weight: 208 lb (94.348 kg)    GEN- The patient is well appearing, alert and oriented x  3 today.   Head- normocephalic, atraumatic Eyes-  Sclera clear, conjunctiva pink Ears- hearing intact Oropharynx- clear Neck- supple, no JVP Lymph- no cervical lymphadenopathy Lungs- Clear to ausculation bilaterally, normal work of breathing Chest- pacemaker pocket is well healed Heart- Regular rate and rhythm, 2/6 SEM LUSB (late peaking) GI- soft, NT, ND, + BS Extremities- no clubbing, cyanosis, or edema MS- no significant deformity or atrophy Skin- no rash or lesion Psych- euthymic mood, full affect Neuro- strength and sensation are intact  Pacemaker interrogation- reviewed in detail today,  See PACEART report  Assessment and Plan:

## 2011-11-20 NOTE — Patient Instructions (Signed)
Your physician has recommended you make the following change in your medication: Stop Aspirin.  Start Xarelto 15mg  1 tablet daily.  Your physician recommends that you schedule a follow-up appointment in: 2 weeks with Anticoagulation Clinic.  Your physician recommends that you return for lab work in: today  Your physician has requested that you have an echocardiogram. Echocardiography is a painless test that uses sound waves to create images of your heart. It provides your doctor with information about the size and shape of your heart and how well your heart's chambers and valves are working. This procedure takes approximately one hour. There are no restrictions for this procedure.  Remote monitoring is used to monitor your Pacemaker of ICD from home. This monitoring reduces the number of office visits required to check your device to one time per year. It allows [place] to keep an eye on the functioning of your device to ensure it is working properly. You are scheduled for a device check from home on Feb 22, 2012. You may send your transmission at any time that day. If you have a wireless device, the transmission will be sent automatically. After your physician reviews your transmission, you will receive a postcard with your next transmission date.   Your physician wants you to follow-up in: 1 year with Dr Johney Frame. You will receive a reminder letter in the mail two months in advance. If you don't receive a letter, please call our office to schedule the follow-up appointment.

## 2011-11-20 NOTE — Assessment & Plan Note (Signed)
Stable No change required today  bmet 

## 2011-11-20 NOTE — Assessment & Plan Note (Signed)
He is now willing to proceed with an echo. I suspect at least moderate AS.  Given age and paucity of symptoms, he would not be a candidate for surgery at this time.

## 2011-11-20 NOTE — Assessment & Plan Note (Signed)
Normal pacemaker function See Pace Art report No changes today  

## 2011-11-20 NOTE — Assessment & Plan Note (Signed)
Pacemaker interrogation today reveals afib intermittently.  Given CHADS2 score of at least 2 (age, HTN) guidelines support anticoagulation.  We discussed risks, benefits, and alternatives to coumadin, pradaxa, and xarelto today.  At this time, he would like to start xarelto.  His CrCL is 50, we will therefore start 15mg  daily.  I will check BMET and CBC today. Stop ASA Return to our pharmacy for anticoagulation initiation (xarelto) follow-up in 4 weeks to make sure that he is doing well with this regimen.

## 2011-11-28 DIAGNOSIS — K5900 Constipation, unspecified: Secondary | ICD-10-CM

## 2011-11-28 HISTORY — DX: Constipation, unspecified: K59.00

## 2011-12-04 ENCOUNTER — Other Ambulatory Visit (HOSPITAL_COMMUNITY): Payer: Self-pay | Admitting: Radiology

## 2011-12-04 DIAGNOSIS — R011 Cardiac murmur, unspecified: Secondary | ICD-10-CM

## 2011-12-05 ENCOUNTER — Ambulatory Visit: Payer: Medicare Other

## 2011-12-05 ENCOUNTER — Ambulatory Visit (HOSPITAL_COMMUNITY): Payer: Medicare Other | Attending: Cardiology | Admitting: Radiology

## 2011-12-05 ENCOUNTER — Other Ambulatory Visit: Payer: Self-pay

## 2011-12-05 VITALS — BP 140/72 | HR 62 | Wt 207.6 lb

## 2011-12-05 DIAGNOSIS — I359 Nonrheumatic aortic valve disorder, unspecified: Secondary | ICD-10-CM | POA: Insufficient documentation

## 2011-12-05 DIAGNOSIS — I4891 Unspecified atrial fibrillation: Secondary | ICD-10-CM

## 2011-12-05 DIAGNOSIS — R011 Cardiac murmur, unspecified: Secondary | ICD-10-CM

## 2011-12-05 DIAGNOSIS — I10 Essential (primary) hypertension: Secondary | ICD-10-CM | POA: Insufficient documentation

## 2011-12-05 NOTE — Progress Notes (Signed)
Pt seen in office today at the request of Dr Allred to evaluate tolerance and compliance of Xarelto. Pt denies any signs and/or symptoms of bleeding.  Reviewed signs and symptoms of abnormal bleeding to watch out for and to contact us if any of these arise. Pt states he is taking Xarelto daily at breakfast.  Educated pt on importance of taking medication daily after evening meal.  Adjusted pt's medication list to reflect this change and to ensure pt takes medication at appropriate time of day.  Recent labwork reviewed Creatinine 1.0/29 BUN on 11/20/11, pt is 78 years old, Xarelto dosage of 15mg  daily is appropriate.  Pt reports no problems since start of Xarelto, and is doing well on medication.  He will continue to monitor and update [place] with any changes in condition or problems.    Fayrene Fearing Allred,MD 12/06/2011

## 2011-12-06 NOTE — Assessment & Plan Note (Signed)
As above.

## 2012-01-11 ENCOUNTER — Encounter: Payer: Medicare Other | Admitting: Internal Medicine

## 2012-01-16 DIAGNOSIS — L919 Hypertrophic disorder of the skin, unspecified: Secondary | ICD-10-CM

## 2012-01-16 DIAGNOSIS — L723 Sebaceous cyst: Secondary | ICD-10-CM

## 2012-01-16 DIAGNOSIS — L909 Atrophic disorder of skin, unspecified: Secondary | ICD-10-CM

## 2012-01-16 HISTORY — DX: Hypertrophic disorder of the skin, unspecified: L91.9

## 2012-01-16 HISTORY — DX: Atrophic disorder of skin, unspecified: L90.9

## 2012-01-16 HISTORY — DX: Sebaceous cyst: L72.3

## 2012-02-22 ENCOUNTER — Encounter: Payer: Self-pay | Admitting: Internal Medicine

## 2012-02-22 ENCOUNTER — Ambulatory Visit (INDEPENDENT_AMBULATORY_CARE_PROVIDER_SITE_OTHER): Payer: Medicare Other | Admitting: *Deleted

## 2012-02-22 DIAGNOSIS — I441 Atrioventricular block, second degree: Secondary | ICD-10-CM

## 2012-02-22 LAB — REMOTE PACEMAKER DEVICE
AL IMPEDENCE PM: 390 Ohm
BAMS-0003: 70 {beats}/min
DEVICE MODEL PM: 7126761
RV LEAD AMPLITUDE: 12 mv
RV LEAD IMPEDENCE PM: 590 Ohm
RV LEAD THRESHOLD: 0.625 V

## 2012-03-01 ENCOUNTER — Telehealth: Payer: Self-pay | Admitting: Internal Medicine

## 2012-03-01 NOTE — Progress Notes (Signed)
PPM remote 

## 2012-03-01 NOTE — Telephone Encounter (Signed)
Shanda Bumps np calling re questions on this pt, wanted to speak to allred but since he is not here wants to talk with kelly

## 2012-03-01 NOTE — Telephone Encounter (Signed)
Spoke with NP She is going to stop Benicar/HCT and start plan Benicar as well as Furosemide 20mg .  Patient is going to see  Dr Neva Seat on Tues next week for follow up and check BMP.  They will call if any more questions

## 2012-03-05 DIAGNOSIS — I509 Heart failure, unspecified: Secondary | ICD-10-CM

## 2012-03-05 HISTORY — DX: Heart failure, unspecified: I50.9

## 2012-03-08 ENCOUNTER — Encounter: Payer: Self-pay | Admitting: *Deleted

## 2012-05-30 ENCOUNTER — Encounter: Payer: Self-pay | Admitting: Internal Medicine

## 2012-05-30 ENCOUNTER — Encounter: Payer: Self-pay | Admitting: *Deleted

## 2012-05-30 ENCOUNTER — Ambulatory Visit (INDEPENDENT_AMBULATORY_CARE_PROVIDER_SITE_OTHER): Payer: Medicare Other | Admitting: *Deleted

## 2012-05-30 DIAGNOSIS — I4891 Unspecified atrial fibrillation: Secondary | ICD-10-CM

## 2012-05-30 DIAGNOSIS — Z950 Presence of cardiac pacemaker: Secondary | ICD-10-CM | POA: Insufficient documentation

## 2012-05-30 LAB — REMOTE PACEMAKER DEVICE
AL AMPLITUDE: 5 mv
AL IMPEDENCE PM: 440 Ohm
BAMS-0003: 70 {beats}/min
BATTERY VOLTAGE: 2.92 V
BRDY-0003RV: 110 {beats}/min
RV LEAD AMPLITUDE: 12 mv

## 2012-06-12 ENCOUNTER — Encounter: Payer: Self-pay | Admitting: *Deleted

## 2012-09-02 ENCOUNTER — Ambulatory Visit (INDEPENDENT_AMBULATORY_CARE_PROVIDER_SITE_OTHER): Payer: Medicare Other | Admitting: *Deleted

## 2012-09-02 DIAGNOSIS — I4891 Unspecified atrial fibrillation: Secondary | ICD-10-CM

## 2012-09-02 DIAGNOSIS — Z950 Presence of cardiac pacemaker: Secondary | ICD-10-CM

## 2012-09-02 LAB — REMOTE PACEMAKER DEVICE
AL AMPLITUDE: 5 mv
ATRIAL PACING PM: 23
BATTERY VOLTAGE: 2.92 V
BRDY-0004RA: 110 {beats}/min
VENTRICULAR PACING PM: 97

## 2012-09-17 ENCOUNTER — Encounter: Payer: Self-pay | Admitting: *Deleted

## 2012-09-23 ENCOUNTER — Encounter: Payer: Self-pay | Admitting: Internal Medicine

## 2012-10-23 DIAGNOSIS — D638 Anemia in other chronic diseases classified elsewhere: Secondary | ICD-10-CM

## 2012-10-23 HISTORY — DX: Anemia in other chronic diseases classified elsewhere: D63.8

## 2012-11-21 ENCOUNTER — Encounter: Payer: Self-pay | Admitting: Internal Medicine

## 2012-11-21 ENCOUNTER — Ambulatory Visit (INDEPENDENT_AMBULATORY_CARE_PROVIDER_SITE_OTHER): Payer: Medicare Other | Admitting: Internal Medicine

## 2012-11-21 VITALS — BP 150/73 | HR 61 | Wt 209.4 lb

## 2012-11-21 DIAGNOSIS — I441 Atrioventricular block, second degree: Secondary | ICD-10-CM

## 2012-11-21 DIAGNOSIS — I4891 Unspecified atrial fibrillation: Secondary | ICD-10-CM

## 2012-11-21 LAB — CBC WITH DIFFERENTIAL/PLATELET
Basophils Absolute: 0 10*3/uL (ref 0.0–0.1)
Eosinophils Absolute: 0.1 10*3/uL (ref 0.0–0.7)
Lymphocytes Relative: 11.7 % — ABNORMAL LOW (ref 12.0–46.0)
MCHC: 34 g/dL (ref 30.0–36.0)
Neutro Abs: 5.8 10*3/uL (ref 1.4–7.7)
Neutrophils Relative %: 75.7 % (ref 43.0–77.0)
Platelets: 188 10*3/uL (ref 150.0–400.0)
RDW: 13.9 % (ref 11.5–14.6)

## 2012-11-21 LAB — PACEMAKER DEVICE OBSERVATION
AL IMPEDENCE PM: 400 Ohm
ATRIAL PACING PM: 25
BAMS-0001: 150 {beats}/min
BATTERY VOLTAGE: 2.9178 V
RV LEAD IMPEDENCE PM: 625 Ohm
RV LEAD THRESHOLD: 1.375 V
VENTRICULAR PACING PM: 98

## 2012-11-21 LAB — BASIC METABOLIC PANEL
Chloride: 105 mEq/L (ref 96–112)
Creatinine, Ser: 1.2 mg/dL (ref 0.4–1.5)
Potassium: 4.3 mEq/L (ref 3.5–5.1)
Sodium: 137 mEq/L (ref 135–145)

## 2012-11-21 NOTE — Patient Instructions (Signed)
Your physician recommends that you schedule a follow-up appointment in: 3 months with anticoagulation clinic   Your physician wants you to follow-up in: 12 months with Dr Johney Frame Bonita Quin will receive a reminder letter in the mail two months in advance. If you don't receive a letter, please call our office to schedule the follow-up appointment.  Remote monitoring is used to monitor your Pacemaker of ICD from home. This monitoring reduces the number of office visits required to check your device to one time per year. It allows [place] to keep an eye on the functioning of your device to ensure it is working properly. You are scheduled for a device check from home on 02/17/13. You may send your transmission at any time that day. If you have a wireless device, the transmission will be sent automatically. After your physician reviews your transmission, you will receive a postcard with your next transmission date.

## 2012-11-21 NOTE — Progress Notes (Signed)
PCP: Nicholas Relic, MD  The patient presents today for routine electrophysiology followup.  Since last being seen in our clinic, the patient reports doing reasonalbly well.  Today, he denies symptoms of palpitations, chest pain, shortness of breath, orthopnea, PND, lower extremity edema, dizziness, presyncope, syncope, or neurologic sequela.  The patient feels that he is tolerating medications without difficulties and is otherwise without complaint today.   Past Medical History  Diagnosis Date  . Hypertension   . Prostate cancer   . Spinal stenosis   . Peripheral neuropathy   . Diverticulosis   . Complete heart block     s/p PPM  . Vitamin D deficiency   . Pacemaker     PPM- St Jude  . Paroxysmal atrial fibrillation    Past Surgical History  Procedure Date  . Pacemaker insertion     PPM- St Jude-3/11    Current Outpatient Prescriptions  Medication Sig Dispense Refill  . amLODipine (NORVASC) 10 MG tablet Take 10 mg by mouth daily.        . calcium-vitamin D 250-100 MG-UNIT per tablet Take 1 tablet by mouth 2 (two) times daily.        . Cholecalciferol (VITAMIN D) 1000 UNITS capsule Take 1,000 Units by mouth daily.        . Clobetasol Prop Emollient Base 0.05 % emollient cream Apply topically 2 (two) times daily.        Marland Kitchen doxazosin (CARDURA) 2 MG tablet Take 2 mg by mouth at bedtime.        . furosemide (LASIX) 20 MG tablet Take 1 tablet (20 mg total) by mouth daily.  90 tablet  3  . gabapentin (NEURONTIN) 100 MG capsule Take 100 mg by mouth 3 (three) times daily.        . Glucosamine-Chondroit-Calcium (TRIPLE FLEX BONE & JOINT) 750-600-100 MG TABS Take by mouth. 1 po daily       . losartan (COZAAR) 50 MG tablet       . meloxicam (MOBIC) 7.5 MG tablet Take 7.5 mg by mouth as needed.        Marland Kitchen olmesartan (BENICAR) 40 MG tablet Take 1 tablet (40 mg total) by mouth daily.      . Rivaroxaban (XARELTO) 15 MG TABS tablet Take 1 tablet (15 mg total) by mouth daily.  90 tablet  1     Allergies  Allergen Reactions  . Fosinopril Sodium   . Meloxicam   . Propranolol Hcl   . Quinapril Hcl   . Ramipril     History   Social History  . Marital Status: Married    Spouse Name: N/A    Number of Children: N/A  . Years of Education: N/A   Occupational History  . marketing executive--retired     [employer]   Social History Main Topics  . Smoking status: Former Smoker -- 40 years    Quit date: 10/24/1987  . Smokeless tobacco: Not on file  . Alcohol Use: No  . Drug Use: No  . Sexually Active: Not on file   Other Topics Concern  . Not on file   Social History Narrative   Exercises 3 times  A week at exercise class.    Family History  Problem Relation Age of Onset  . Coronary artery disease      family history     Physical Exam: Filed Vitals:   11/21/12 1131  BP: 150/73  Pulse: 61  Weight: 209 lb 6.4 oz (  94.983 kg)    GEN- The patient is elderly appearing, alert and oriented x 3 today.   Head- normocephalic, atraumatic Eyes-  Sclera clear, conjunctiva pink Ears- hearing intact Oropharynx- clear Neck- supple,  Lungs- Clear to ausculation bilaterally, normal work of breathing Chest- pacemaker pocket is well healed Heart- Regular rate and rhythm, 2/6 SEM LUSB (late peaking but stable,  S2 is audible) GI- soft, NT, ND, + BS Extremities- no clubbing, cyanosis, or edema Walks slowly with a rolling walker  Pacemaker interrogation- reviewed in detail today,  See PACEART report  Assessment and Plan:

## 2012-11-21 NOTE — Assessment & Plan Note (Signed)
Normal pacemaker function See Pace Art report No changes today  

## 2012-11-21 NOTE — Assessment & Plan Note (Signed)
Continue xarelto bmet and cbc today To be followed in our anticoagulation clinic also

## 2013-02-03 ENCOUNTER — Telehealth: Payer: Self-pay | Admitting: Internal Medicine

## 2013-02-03 NOTE — Telephone Encounter (Signed)
New problem   Nurse has question about anti colag appt pt had today. They need to know if they can draw his labs at there office. Please call

## 2013-02-04 NOTE — Telephone Encounter (Signed)
Spoke with pt's son Harvie Heck.  He was made aware that it is not INRs that need to be drawn.  He is due to have repeat CBC/BMET for Xarelto.  Requested pt come in for the appt rather than having labs done at [facility] so we can evaluate him for any bleeding issues.  Son is agreeable

## 2013-02-10 ENCOUNTER — Encounter: Payer: Self-pay | Admitting: Pharmacist

## 2013-02-10 ENCOUNTER — Encounter: Payer: Self-pay | Admitting: *Deleted

## 2013-02-11 ENCOUNTER — Ambulatory Visit (INDEPENDENT_AMBULATORY_CARE_PROVIDER_SITE_OTHER): Payer: Medicare Other

## 2013-02-11 DIAGNOSIS — Z7902 Long term (current) use of antithrombotics/antiplatelets: Secondary | ICD-10-CM

## 2013-02-11 DIAGNOSIS — I4891 Unspecified atrial fibrillation: Secondary | ICD-10-CM

## 2013-02-11 LAB — BASIC METABOLIC PANEL
CO2: 28 mEq/L (ref 19–32)
Calcium: 8.8 mg/dL (ref 8.4–10.5)
Glucose, Bld: 108 mg/dL — ABNORMAL HIGH (ref 70–99)
Sodium: 135 mEq/L (ref 135–145)

## 2013-02-11 LAB — CBC
HCT: 36.3 % — ABNORMAL LOW (ref 39.0–52.0)
Platelets: 175 10*3/uL (ref 150.0–400.0)
RBC: 3.9 Mil/uL — ABNORMAL LOW (ref 4.22–5.81)
WBC: 7.5 10*3/uL (ref 4.5–10.5)

## 2013-02-11 NOTE — Patient Instructions (Addendum)

## 2013-02-11 NOTE — Progress Notes (Signed)
Pt was started on Xarelto 15mg  daily for afib on 11/20/11 by Dr Johney Frame.  Reviewed patients medication list.  Pt is not currently on any combined P-gp and strong CYP3A4 inhibitors/inducers (ketoconazole, traconazole, ritonavir, carbamazepine, phenytoin, rifampin, St. John's wort).  Reviewed labs.  SCr 1.2, Weight 95kg, CrCl 49.46.  Dose is appropriate based on creatine clearance.   Hgb and HCT 12.3/36.3 stable.  Orders faxed to [facility] to continue on same dosage of Xarelto 15mg  QD, f/u in 6 months.

## 2013-02-17 ENCOUNTER — Other Ambulatory Visit: Payer: Self-pay | Admitting: Internal Medicine

## 2013-02-17 ENCOUNTER — Ambulatory Visit (INDEPENDENT_AMBULATORY_CARE_PROVIDER_SITE_OTHER): Payer: Medicare Other | Admitting: *Deleted

## 2013-02-17 ENCOUNTER — Encounter: Payer: Self-pay | Admitting: Internal Medicine

## 2013-02-17 DIAGNOSIS — I4891 Unspecified atrial fibrillation: Secondary | ICD-10-CM

## 2013-02-17 DIAGNOSIS — Z950 Presence of cardiac pacemaker: Secondary | ICD-10-CM

## 2013-02-18 LAB — REMOTE PACEMAKER DEVICE
ATRIAL PACING PM: 34
BRDY-0002RV: 60 {beats}/min
BRDY-0003RV: 110 {beats}/min
VENTRICULAR PACING PM: 99

## 2013-03-12 ENCOUNTER — Encounter: Payer: Self-pay | Admitting: *Deleted

## 2013-04-15 ENCOUNTER — Telehealth: Payer: Self-pay | Admitting: Neurology

## 2013-04-15 NOTE — Telephone Encounter (Signed)
I tried calling the patient to inform him that we needed to r/s his appt. with Dr. Anne Hahn on 04-17-13, but his phone just kept ringing.

## 2013-04-17 ENCOUNTER — Telehealth: Payer: Self-pay | Admitting: Neurology

## 2013-04-17 NOTE — Telephone Encounter (Signed)
Patient will be assigned and callled with a appointment. Called number no answer and no voice mail.

## 2013-04-29 ENCOUNTER — Non-Acute Institutional Stay: Payer: Medicare Other | Admitting: Internal Medicine

## 2013-04-29 ENCOUNTER — Encounter: Payer: Self-pay | Admitting: Internal Medicine

## 2013-04-29 VITALS — BP 130/88 | HR 76 | Ht 69.0 in | Wt 206.0 lb

## 2013-04-29 DIAGNOSIS — Z950 Presence of cardiac pacemaker: Secondary | ICD-10-CM

## 2013-04-29 DIAGNOSIS — G609 Hereditary and idiopathic neuropathy, unspecified: Secondary | ICD-10-CM

## 2013-04-29 DIAGNOSIS — C61 Malignant neoplasm of prostate: Secondary | ICD-10-CM

## 2013-04-29 DIAGNOSIS — R011 Cardiac murmur, unspecified: Secondary | ICD-10-CM

## 2013-04-29 DIAGNOSIS — I10 Essential (primary) hypertension: Secondary | ICD-10-CM

## 2013-04-29 DIAGNOSIS — Z299 Encounter for prophylactic measures, unspecified: Secondary | ICD-10-CM

## 2013-04-29 DIAGNOSIS — R21 Rash and other nonspecific skin eruption: Secondary | ICD-10-CM

## 2013-04-29 DIAGNOSIS — I4891 Unspecified atrial fibrillation: Secondary | ICD-10-CM

## 2013-04-29 MED ORDER — TRIAMCINOLONE ACETONIDE 0.1 % EX CREA: TOPICAL_CREAM | CUTANEOUS | Status: DC

## 2013-04-29 NOTE — Patient Instructions (Signed)
Continue current medications. 

## 2013-04-29 NOTE — Progress Notes (Signed)
Date: 04/29/2013  MRN:  [id number] Name:  Nicholas Hood Sex:  male Age:  79 y.o. DOB:Jul 30, 1916   [facility] #:   [id number]                    Facility/Room; [facility] [place] 20 Level Of Care: [place] Provider: Murray Hodgkins, MD  Emergency Contacts: Contact Information   Name Relation Home Work Sells Other [phone]       Code Status: NCB  Allergies: Allergies  Allergen Reactions  . Fosinopril Sodium   . Hctz (Hydrochlorothiazide)   . Meloxicam   . Propranolol Hcl   . Quinapril Hcl   . Ramipril   . Reserpine      Chief Complaint  Patient presents with  . Medical Managment of Chronic Issues    Comprehensive exam: blood pressure, Vitamin D deficiency, CHF, edema. c/o rash under right arm to hip, on shoulder     HPI: Rash on the right chest. Very itchy, esp at night. Complicated by multiple SK and skin tags.  Numb feet, peripheral neuropathy, is unchanged.  Unstable gait. Using walker with 2 front wheels and rear skids.  Pacemaker followed by cardiology.  AS/AI is stable.  Past Medical History  Diagnosis Date  . Hypertension   . Prostate cancer   . Spinal stenosis   . Peripheral neuropathy   . Diverticulosis   . Complete heart block     s/p PPM  . Vitamin D deficiency   . Pacemaker     PPM- St Jude  . Paroxysmal atrial fibrillation   . Contusion of face, scalp, and neck except eye(s) 04/23/2012  . Abnormal weight gain 04/12/2012  . Fall from other slipping, tripping, or stumbling 04/08/2012  . Congestive heart failure, unspecified 03/05/2012  . Shortness of breath 03/05/2012  . Unspecified hypertrophic and atrophic condition of skin 01/16/2012  . Sebaceous cyst 01/16/2012  . Unspecified constipation 11/28/2011  . Edema 04/12/2010  . Other specified cardiac dysrhythmias(427.89) 01/03/2010  . Other premature beats 06/02/2008  . Other malaise and fatigue 01/28/2008  . Nonspecific abnormal electrocardiogram (ECG) (EKG) 01/28/2008  . Internal hemorrhoids  without mention of complication 06/03/2007  . Unspecified hearing loss 02/19/2007  . Osteoarthrosis, unspecified whether generalized or localized, unspecified site 02/19/2007  . Left bundle branch hemiblock 06/04/1999  . Abnormality of gait 06/04/1999  . Aortic valve disorders 06/04/1979    Past Surgical History  Procedure Laterality Date  . Pacemaker insertion      PPM- St Jude-3/11  . Back surgery  2000    Spinal stenosis Dr. Trellis Paganini  . Nasal polyp excision    . Tonsillectomy    . Cataract extraction w/ intraocular lens  implant, bilateral  10/2009    Dr. Charlotte Sanes  . Pacemaker placement  01/05/2010     Procedures: 2002 Radiation Dr. Vernie Ammons  2000 Spinal Stenosis  Dr. Trellis Paganini  2005 Colonoscopy [place]): normal 07/25/07 Colonoscopy [place]): int. hem.; diverticulosis 01-03-10: ekg: bradycardia; left bbb; third degree block  Past hospitalizations Same as Surgeries 2000 Spinal Stenosis Dr. 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 Nelva Nay  12/2009- Pacemaker Implantation  Consultants: Dr. Lajuana Matte, Ophthalmology  Dr. Sandria Manly Neurology  Dr. Vernie Ammons Urology  Dr. Virginia [place] GI Dr. Johney Frame: Cardiology Dr. Pollyann Kennedy ENT Dr [facility]    Current Outpatient Prescriptions  Medication Sig Dispense Refill  . Calcium Carb-Cholecalciferol 500-400 MG-UNIT TABS Take by mouth. Take one tablet daily for calcium      .  Cholecalciferol (VITAMIN D) 1000 UNITS capsule Take 1,000 Units by mouth. Take 2 capsules daily      . Docusate Sodium (DIOCTO) 150 MG/15ML syrup Take 50 mg by mouth. Put 3-5 drops in each ear at bedtime on Sat and Sun twice a month      . doxazosin (CARDURA) 2 MG tablet Take 2 mg by mouth at bedtime.        . furosemide (LASIX) 20 MG tablet Take 1 tablet (20 mg total) by mouth daily.  90 tablet  3  . glucosamine-chondroitin 500-400 MG tablet Take 1 tablet by mouth 3 (three) times daily.      Marland Kitchen losartan (COZAAR) 50 MG tablet daily.       . Rivaroxaban (XARELTO) 15 MG TABS tablet  Take 1 tablet (15 mg total) by mouth daily.  90 tablet  1  . sennosides-docusate sodium (SENOKOT-S) 8.6-50 MG tablet Take 1 tablet by mouth. Take one tablet as needed for constipation           Immunization History  Administered Date(s) Administered  . Influenza Whole 08/12/2012  . Pneumococcal Polysaccharide 10/23/2004  . Td 10/24/2007     Diet: regular  History  Substance Use Topics  . Smoking status: Former Smoker -- 40 years    Quit date: 10/23/1937  . Smokeless tobacco: Never Used  . Alcohol Use: No    Family History  Problem Relation Age of Onset  . Coronary artery disease      family history  . Stroke Mother   . Heart disease Father   . Heart disease Brother   . Heart disease Brother   . Heart disease Brother   . Lung disease Brother    FATHER:    The cause of death was heart disease.  Death occurred at age 58. MOTHER:   The cause of death was.  Death occurred at age 54.stroke  SIBLINGS:   1)  The cause of death was heart disease.  Death occurred at age 29.[place]hal  2)  The cause of death was heart disease.  Death occurred at age 45.Edward  3)  The cause of death was heart disease.  Death occurred in the 61's.Leonard  4)  The cause of death was.Lung Problems age 67 James  5)  The patient's sister is living.Janean Sark  6)  The patient's sister is living.Blanche Morgan--lives in [place] CHILDREN:   1)  The patient's daughter is living.Carol  2)  The patient's son is living.Randall   Review of Systems DATA OBTAINED: from patient, medical record GENERAL: Feels well   No fevers, fatigue, change in appetite or weight SKIN: No itch or open wounds. Many SK, skin tags and a rash in the right chest wall and axilla. Has scratched until it bleeds in areas. EYES: No eye pain, dryness or itching  No change in vision. Corrective lenses. EARS: No earache, tinnitus. Partial deafness. Bilateral hearing aides. NOSE: No congestion, drainage or bleeding MOUTH/THROAT: No mouth or  tooth pain  No sore throat No difficulty chewing or swallowing RESPIRATORY: No cough, wheezing, SOB CARDIAC: No chest pain, palpitations  No edema. CHEST/BREASTS: No discomfort, discharge or lumps in breasts. Pacemaker. GI: No abdominal pain  No N/V/D or constipation  No heartburn or reflux  GU: No dysuria, frequency or urgency  No change in urine volume or character No nocturia or change in stream. History of cancer of prostate. MUSCULOSKELETAL: No joint pain, swelling, back pain.  No muscle ache, pain. Gait is unsteady.  Weak knees. NEUROLOGIC: No dizziness, fainting, headache.  No change in mental status. Balance problems. Numb in feet. Hxof peeripheral neuropathy. PSYCHIATRIC: No feelings of anxiety, depression Sleeps well.  No behavior issue.   Physical Exam Vital signs: BP 130/88  Pulse 76  Ht 5\' 9"  (1.753 m)  Wt 206 lb (93.441 kg)  BMI 30.41 kg/m2  GENERAL APPEARANCE: No acute distress, appropriately groomed, normal body habitus. Alert, pleasant, conversant. SKIN: multiple skin tags and irritated SK. Diffuse red rash on right chest wall and axilla. Areas of broken skin from scratching. HEAD: Normocephalic, atraumatic EYES: Conjunctiva/lids clear. Pupils round, reactive. EOMs intact.  EARS: External exam WNL, canals clear, TM WNL. Hearing severely impaired. Communication difficult even with aides in both ears. NOSE: No deformity or discharge. MOUTH/THROAT: Lips w/o lesions. Oral mucosa, tongue moist, w/o lesion. Oropharynx w/o redness or lesions.  NECK: Supple, full ROM. No thyroid tenderness, enlargement or nodule LYMPHATICS: No head, neck or supraclavicular adenopathy RESPIRATORY: Breathing is even, unlabored. Lung sounds are clear and full.  CARDIOVASCULAR: Heart RRR. Grade4/6 ejection murmur in sysotle. Loudest at theSLSB and the base  ARTERIAL: No carotid, aortic or femoral bruit. Diminished DP and PT pulses bilaterally.  VENOUS: No varicosities. No venous stasis skin  changes  EDEMA: +1 edema of both feet. GASTROINTESTINAL: Abdomen is soft, non-tender, not distended w/ normal bowel sounds. No hepatic or splenic enlargement. No mass, ventral or inguinal hernia.  RECTAL: No anal fissure, skin tag or external hemorrhoid. Sphincter tone WNL. Stool is brown, heme negative. GENITOURINARY: Bladder non tender, not distended.  MALE: No penile lesion or drainage. No scrotal edema, tenderness or mass. MUSCULOSKELETAL: Moves all extremities with full ROM, strength and tone. Back is without kyphosis, scoliosis or spinal process tenderness. Gait is unsteady. Using walker. NEUROLOGIC: Oriented to time, place, person. Cranial nerves 2-12 grossly intact, speech clear, no tremor. Patella, brachial DTR 2+. Loss of vibratory sensation in both feet. PSYCHIATRIC: Mood and affect appropriate to situation   Screening Score  MMS    PHQ2 0  PHQ9     Fall Risk    BIMS    Lab reports 03/04/12  EKG: Rate 79. Pacemaker spikes periodically throughout the tracing. 03/07/2012 CBC: Rbc 3.47, Hgb 11.0, Hct 33.1 CMP: Sodium 136, Potassium 3.9, glucose 96, BUN 30, Creatinine 1.02 TSH 0.978 BNP 80.1 03/21/12 BMP Na 140, K 4.4, glucose 84, Bun 30, creatinine 1.09, Ca 9.0 04/01/12 BMP na 140, K 4.4, glucose 84, Bun 30, creatinine .09, Ca 9.0 6/18.13 CBC wbc 7.6, Hgb 11.4, Hct 32.8, plt 191 07/08/2012 BMP: Sodium 141, Potassium 3.9, glucose 100, BUN 25, Creatinine 1.15 HgbA1c 5.8   Annual summary: Hospitalizations: none since 2011 Infection History: none of significance Functional assessment: assist with bathing, med supervision Areas of potential improvement: none likely Rehabilitation Potential:poor Prognosis for survival: fair  Plan: Preventive measure - Plan: DNR (Do Not Resuscitate)  Rash and nonspecific skin eruption - Plan: triamcinolone cream (KENALOG) 0.1 %  PROSTATE CANCER: no evidence for relapse  PERIPHERAL NEUROPATHY: unchanged  Essential hypertension, benign:   controlled  SYSTOLIC MURMUR: aortic stenosis and insufficiency  Pacemaker-St.Jude: followed by cardiology  Atrial fibrillation: NSR today

## 2013-05-26 ENCOUNTER — Encounter: Payer: Medicare Other | Admitting: *Deleted

## 2013-05-27 ENCOUNTER — Encounter: Payer: Self-pay | Admitting: *Deleted

## 2013-05-27 ENCOUNTER — Non-Acute Institutional Stay: Payer: Medicare Other | Admitting: Internal Medicine

## 2013-05-27 ENCOUNTER — Encounter: Payer: Self-pay | Admitting: Internal Medicine

## 2013-05-27 VITALS — BP 148/76 | HR 68 | Ht 69.0 in | Wt 208.0 lb

## 2013-05-27 DIAGNOSIS — I10 Essential (primary) hypertension: Secondary | ICD-10-CM

## 2013-05-27 DIAGNOSIS — R21 Rash and other nonspecific skin eruption: Secondary | ICD-10-CM

## 2013-05-27 MED ORDER — TRIAMCINOLONE ACETONIDE 0.1 % EX CREA: TOPICAL_CREAM | CUTANEOUS | Status: DC

## 2013-05-27 MED ORDER — CETIRIZINE HCL 10 MG PO TABS: ORAL_TABLET | ORAL | Status: DC

## 2013-05-27 NOTE — Progress Notes (Signed)
  Subjective:    Patient ID: Nicholas Hood, male    DOB: 10/19/1916, 79 y.o.   MRN: [id number]  HPI   Current Outpatient Prescriptions on File Prior to Visit  Medication Sig Dispense Refill  . Calcium Carb-Cholecalciferol 500-400 MG-UNIT TABS Take by mouth. Take one tablet daily for calcium      . Cholecalciferol (VITAMIN D) 1000 UNITS capsule Take 1,000 Units by mouth. Take 2 capsules daily      . doxazosin (CARDURA) 2 MG tablet Take 2 mg by mouth at bedtime.        . furosemide (LASIX) 20 MG tablet Take 1 tablet (20 mg total) by mouth daily.  90 tablet  3  . glucosamine-chondroitin 500-400 MG tablet Take 1 tablet by mouth 3 (three) times daily.      Marland Kitchen losartan (COZAAR) 50 MG tablet daily.       . Rivaroxaban (XARELTO) 15 MG TABS tablet Take 1 tablet (15 mg total) by mouth daily.  90 tablet  1  . sennosides-docusate sodium (SENOKOT-S) 8.6-50 MG tablet Take 1 tablet by mouth. Take one tablet as needed for constipation      . triamcinolone cream (KENALOG) 0.1 % Apply to rash on right chest daily  454 g  11   No current facility-administered medications on file prior to visit.    Review of Systems  Constitutional: Negative for fever.  HENT: Positive for hearing loss.   Eyes: Negative.   Respiratory: Negative for cough and shortness of breath.   Cardiovascular: Negative for chest pain, palpitations and leg swelling.  Gastrointestinal: Negative.   Endocrine: Negative.   Genitourinary: Negative.   Musculoskeletal: Positive for back pain, arthralgias and gait problem.  Skin: Positive for rash.  Allergic/Immunologic: Negative.   Neurological: Positive for tremors and weakness.  Hematological: Negative.   Psychiatric/Behavioral: Negative.        Objective:BP 148/76  Pulse 68  Ht 5\' 9"  (1.753 m)  Wt 208 lb (94.348 kg)  BMI 30.7 kg/m2    Physical Exam  Constitutional: He is oriented to person, place, and time. He appears well-nourished. No distress.  HENT:  Head: Normocephalic and  atraumatic.  Severe hearing loss  Eyes:  Corrective lenses  Neck: Neck supple. No JVD present. No tracheal deviation present. No thyromegaly present.  Cardiovascular: Normal rate.  Exam reveals no gallop and no friction rub.   AF  Pulmonary/Chest: Breath sounds normal. No respiratory distress. He has no wheezes. He has no rales.  Abdominal: Soft. Bowel sounds are normal. He exhibits no distension and no mass. There is no tenderness.  Musculoskeletal: Normal range of motion. He exhibits no edema and no tenderness.  Lymphadenopathy:    He has no cervical adenopathy.  Neurological: He is alert and oriented to person, place, and time. No cranial nerve deficit. Coordination abnormal.  Skin:  Dry skin. Rash on the right shoulder, arm, and upper back. Excoriations.  Psychiatric: He has a normal mood and affect. His behavior is normal. Judgment and thought content normal.          Assessment & Plan:  Rash and nonspecific skin eruption - Plan: cetirizine (ZYRTEC) 10 MG tablet, CBC with Differential, Comprehensive metabolic panel, triamcinolone cream (KENALOG) 0.1 %  Essential hypertension, benign - Plan: Comprehensive metabolic panel

## 2013-05-29 LAB — HEPATIC FUNCTION PANEL: Bilirubin, Total: 0.5 mg/dL

## 2013-05-29 LAB — BASIC METABOLIC PANEL
BUN: 30 mg/dL — AB (ref 4–21)
Glucose: 85 mg/dL
Sodium: 141 mmol/L (ref 137–147)

## 2013-05-29 LAB — CBC AND DIFFERENTIAL
HCT: 32 % — AB (ref 41–53)
WBC: 6.2 10^3/mL

## 2013-07-08 ENCOUNTER — Encounter: Payer: Self-pay | Admitting: Internal Medicine

## 2013-07-08 ENCOUNTER — Non-Acute Institutional Stay: Payer: Medicare Other | Admitting: Internal Medicine

## 2013-07-08 VITALS — BP 158/86 | HR 72 | Ht 69.0 in | Wt 209.0 lb

## 2013-07-08 DIAGNOSIS — I10 Essential (primary) hypertension: Secondary | ICD-10-CM

## 2013-07-08 DIAGNOSIS — R21 Rash and other nonspecific skin eruption: Secondary | ICD-10-CM

## 2013-07-08 DIAGNOSIS — I4891 Unspecified atrial fibrillation: Secondary | ICD-10-CM

## 2013-07-08 LAB — BASIC METABOLIC PANEL
BUN: 25 mg/dL — AB (ref 4–21)
Creatinine: 1.2 mg/dL (ref 0.6–1.3)
Glucose: 100 mg/dL
Potassium: 3.9 mmol/L (ref 3.4–5.3)

## 2013-07-08 LAB — HEMOGLOBIN A1C: Hgb A1c MFr Bld: 5.8 % (ref 4.0–6.0)

## 2013-07-08 NOTE — Patient Instructions (Addendum)
Continue current medications. I added TCA cream again. Use Cleocin for 1 week to help pustules.

## 2013-07-08 NOTE — Progress Notes (Signed)
Subjective:    Patient ID: Nicholas Hood, male    DOB: 05-19-1916, 79 y.o.   MRN: [id number]  HPI Continues to have itching.Appearance of the rash has improved, but itching in the front and back is the same. This last visit this patient was put on triamcinolone acetonide cream and Zyrtec orally. His medications were stopped 06/28/13. He says that he was doing well with his rash until the medications were stopped. Apparently this was a pharmacy recommendation of Manxie Mast, NP signed off on.  Systolic blood pressure was mildly elevated today. He does have an irregular pulse.  Current Outpatient Prescriptions on File Prior to Visit  Medication Sig Dispense Refill  . Calcium Carb-Cholecalciferol 500-400 MG-UNIT TABS Take by mouth. Take one tablet daily for calcium      . cetirizine (ZYRTEC) 10 MG tablet One daily to reduce itching  30 tablet  11  . Cholecalciferol (VITAMIN D) 1000 UNITS capsule Take 1,000 Units by mouth. Take 2 capsules daily      . Docusate Sodium (DIOCTO) 150 MG/15ML syrup Take 50 mg by mouth daily. Use 3-5 drops in each ear at bedtime on Saturday and Sunday twice a month      . doxazosin (CARDURA) 2 MG tablet Take 2 mg by mouth at bedtime.        Marland Kitchen glucosamine-chondroitin 500-400 MG tablet Take 1 tablet by mouth 3 (three) times daily.      Marland Kitchen losartan (COZAAR) 50 MG tablet daily.       . Rivaroxaban (XARELTO) 15 MG TABS tablet Take 1 tablet (15 mg total) by mouth daily.  90 tablet  1  . sennosides-docusate sodium (SENOKOT-S) 8.6-50 MG tablet Take 1 tablet by mouth. Take one tablet as needed for constipation      . triamcinolone cream (KENALOG) 0.1 % Apply to rash on right chest, back, and other pruritic sites daily  454 g  11   No current facility-administered medications on file prior to visit.    Review of Systems  Constitutional: Negative for fever.  HENT: Positive for hearing loss.   Eyes: Negative.   Respiratory: Negative for cough and shortness of breath.    Cardiovascular: Negative for chest pain, palpitations and leg swelling.  Gastrointestinal: Negative.   Endocrine: Negative.   Genitourinary: Negative.   Musculoskeletal: Positive for back pain, arthralgias and gait problem.  Skin: Positive for rash.  Allergic/Immunologic: Negative.   Neurological: Positive for tremors and weakness.  Hematological: Negative.   Psychiatric/Behavioral: Negative.        Objective:   Physical Exam  Constitutional: He is oriented to person, place, and time. He appears well-nourished. No distress.  HENT:  Head: Normocephalic and atraumatic.  Severe hearing loss  Eyes:  Corrective lenses  Neck: Neck supple. No JVD present. No tracheal deviation present. No thyromegaly present.  Cardiovascular: Normal rate.  Exam reveals no gallop and no friction rub.   AF  Pulmonary/Chest: Breath sounds normal. No respiratory distress. He has no wheezes. He has no rales.  Abdominal: Soft. Bowel sounds are normal. He exhibits no distension and no mass. There is no tenderness.  Musculoskeletal: Normal range of motion. He exhibits no edema and no tenderness.  Lymphadenopathy:    He has no cervical adenopathy.  Neurological: He is alert and oriented to person, place, and time. No cranial nerve deficit. Coordination abnormal.  Skin:  Dry skin. Rash on the right shoulder, arm, and upper back. Excoriations.  Psychiatric: He has a normal mood and  affect. His behavior is normal. Judgment and thought content normal.          Assessment & Plan:  Rash and nonspecific skin eruption: Resume triamcinolone acetonide 0.1% cream daily to the rash. There are some pustules present, so I added Cleocin for 7 days.  Atrial fibrillation: Rate controlled. Stable.  Essential hypertension, benign: No change in medications today; continue to monitor.

## 2013-07-22 ENCOUNTER — Encounter: Payer: Self-pay | Admitting: Internal Medicine

## 2013-07-22 ENCOUNTER — Non-Acute Institutional Stay: Payer: Medicare Other | Admitting: Internal Medicine

## 2013-07-22 VITALS — BP 134/78 | HR 60 | Ht 69.0 in | Wt 206.0 lb

## 2013-07-22 DIAGNOSIS — I10 Essential (primary) hypertension: Secondary | ICD-10-CM

## 2013-07-22 DIAGNOSIS — R21 Rash and other nonspecific skin eruption: Secondary | ICD-10-CM

## 2013-07-22 NOTE — Patient Instructions (Signed)
Continue current medications. 

## 2013-07-22 NOTE — Progress Notes (Signed)
Subjective:    Patient ID: Nicholas Hood, male    DOB: [birth year], 79 y.o.   MRN: [id number]  HPI Continues with diffuse pruritic rash . Involves the chest, back and both upper arms. Has improved on TCA 0.1% cream daily, but itching persists and he scratches enough to draw blood and have broken skin. No pustules now.  Current Outpatient Prescriptions on File Prior to Visit  Medication Sig Dispense Refill  . Calcium Carb-Cholecalciferol 500-400 MG-UNIT TABS Take by mouth. Take one tablet daily for calcium      . cetirizine (ZYRTEC) 10 MG tablet One daily to reduce itching  30 tablet  11  . Cholecalciferol (VITAMIN D) 1000 UNITS capsule Take 1,000 Units by mouth. Take 2 capsules daily      . Docusate Sodium (DIOCTO) 150 MG/15ML syrup Take 50 mg by mouth daily. Use 3-5 drops in each ear at bedtime on Saturday and Sunday twice a month      . doxazosin (CARDURA) 2 MG tablet Take 2 mg by mouth at bedtime.        Marland Kitchen glucosamine-chondroitin 500-400 MG tablet Take 1 tablet by mouth 3 (three) times daily.      Marland Kitchen losartan (COZAAR) 50 MG tablet daily.       . Rivaroxaban (XARELTO) 15 MG TABS tablet Take 1 tablet (15 mg total) by mouth daily.  90 tablet  1  . sennosides-docusate sodium (SENOKOT-S) 8.6-50 MG tablet Take 1 tablet by mouth. Take one tablet as needed for constipation      . triamcinolone cream (KENALOG) 0.1 % Apply to rash on right chest, back, and other pruritic sites daily  454 g  11   No current facility-administered medications on file prior to visit.    Review of Systems  Constitutional: Negative for fever.  HENT: Positive for hearing loss.   Eyes: Negative.   Respiratory: Negative for cough and shortness of breath.   Cardiovascular: Negative for chest pain, palpitations and leg swelling.  Gastrointestinal: Negative.   Endocrine: Negative.   Genitourinary: Negative.   Musculoskeletal: Positive for back pain, arthralgias and gait problem.  Skin: Positive for rash.       Pruritus   Allergic/Immunologic: Negative.   Neurological: Positive for tremors and weakness.  Hematological: Negative.   Psychiatric/Behavioral: Negative.        Objective:BP 134/78  Pulse 60  Ht 5\' 9"  (1.753 m)  Wt 206 lb (93.441 kg)  BMI 30.41 kg/m2    Physical Exam  Constitutional: He is oriented to person, place, and time. He appears well-nourished. No distress.  HENT:  Head: Normocephalic and atraumatic.  Severe hearing loss  Eyes:  Corrective lenses  Neck: Neck supple. No JVD present. No tracheal deviation present. No thyromegaly present.  Cardiovascular: Normal rate.  Exam reveals no gallop and no friction rub.   AF  Pulmonary/Chest: Breath sounds normal. No respiratory distress. He has no wheezes. He has no rales.  Abdominal: Soft. Bowel sounds are normal. He exhibits no distension and no mass. There is no tenderness.  Musculoskeletal: Normal range of motion. He exhibits no edema and no tenderness.  Lymphadenopathy:    He has no cervical adenopathy.  Neurological: He is alert and oriented to person, place, and time. No cranial nerve deficit. Coordination abnormal.  Skin:  Dry skin. Rash on both shoulders, arms, and upper back. Excoriations.  Psychiatric: He has a normal mood and affect. His behavior is normal. Judgment and thought content normal.  Assessment & Plan:  Rash and nonspecific skin eruption: improving  Continue TCA cream and resume cetirizine  Essential hypertension, benign; controlled

## 2013-09-09 ENCOUNTER — Encounter: Payer: Self-pay | Admitting: Nurse Practitioner

## 2013-09-09 ENCOUNTER — Non-Acute Institutional Stay: Payer: Medicare Other | Admitting: Nurse Practitioner

## 2013-09-09 VITALS — BP 130/64 | HR 60 | Ht 69.0 in | Wt 206.0 lb

## 2013-09-09 DIAGNOSIS — I10 Essential (primary) hypertension: Secondary | ICD-10-CM

## 2013-09-09 DIAGNOSIS — Z950 Presence of cardiac pacemaker: Secondary | ICD-10-CM

## 2013-09-09 DIAGNOSIS — R269 Unspecified abnormalities of gait and mobility: Secondary | ICD-10-CM

## 2013-09-09 DIAGNOSIS — L299 Pruritus, unspecified: Secondary | ICD-10-CM

## 2013-09-09 DIAGNOSIS — D638 Anemia in other chronic diseases classified elsewhere: Secondary | ICD-10-CM

## 2013-09-09 DIAGNOSIS — K5900 Constipation, unspecified: Secondary | ICD-10-CM

## 2013-09-09 DIAGNOSIS — I4891 Unspecified atrial fibrillation: Secondary | ICD-10-CM

## 2013-09-09 NOTE — Assessment & Plan Note (Signed)
Functional 

## 2013-09-09 NOTE — Assessment & Plan Note (Signed)
Controlled on Cardura 2mg, Losartan 50mg,   

## 2013-09-09 NOTE — Progress Notes (Signed)
Patient ID: Nicholas Hood, male   DOB: [birth year], 79 y.o.   MRN: [id number]  Allergies  Allergen Reactions  . Fosinopril Sodium   . Hctz [Hydrochlorothiazide]   . Meloxicam   . Propranolol Hcl   . Quinapril Hcl   . Ramipril   . Reserpine     Chief Complaint  Patient presents with  . Medical Managment of Chronic Issues    Blood pressure    HPI: Patient is a 79 y.o. male seen in the clinic at [facility] today for itching, blood pressure, and other chronic medical conditions.  Problem List Items Addressed This Visit   Anemia of chronic disease     Hgb dropped from 12 12/2012 to 11 05/2013. normocytic and normochromic in nature. Will continue to monitor CBC as well as the patient.      Atrial fibrillation     Rate controlled without medications. Takes Xarelto, worries about bleeding. Hgb 11.1 05/29/13. Update CBC    ESSENTIAL HYPERTENSION, BENIGN - Primary     Controlled on Cardura 2mg , Losartan 50mg ,     Gait disorder     Motor scooter for mobility. Spinal stenosis and peripheral neuropathy all contribute to the problem.     Pacemaker-St.Jude     Functional.     Pruritus     Still c/o itching and noted some scratchy marks right arm, right neck. Takes Zyrtec and topical hydrocortisone cream.     Unspecified constipation     Stable on Colace senna daily.        Review of Systems:  Review of Systems  Constitutional: Negative for fever, chills, weight loss, malaise/fatigue and diaphoresis.  HENT: Positive for hearing loss. Negative for congestion, ear discharge, ear pain, nosebleeds and tinnitus.   Eyes: Negative for blurred vision, double vision, photophobia, pain and discharge.  Respiratory: Negative for cough, hemoptysis, sputum production, shortness of breath, wheezing and stridor.   Cardiovascular: Negative for chest pain, palpitations, orthopnea, claudication, leg swelling and PND.  Gastrointestinal: Positive for constipation. Negative for heartburn, nausea,  vomiting, abdominal pain, diarrhea and blood in stool.  Genitourinary: Positive for frequency. Negative for dysuria, urgency, hematuria and flank pain.  Musculoskeletal: Negative for back pain, falls, joint pain, myalgias and neck pain.  Skin: Positive for itching.       Scratchy marks right neck and arm.   Neurological: Positive for tremors. Negative for dizziness, tingling, sensory change, speech change, focal weakness, seizures, loss of consciousness, weakness and headaches.       Noted resting tremor in the right fingers  Endo/Heme/Allergies: Positive for environmental allergies. Negative for polydipsia. Bruises/bleeds easily.  Psychiatric/Behavioral: Positive for memory loss. Negative for depression, suicidal ideas, hallucinations and substance abuse. The patient is not nervous/anxious and does not have insomnia.        Repetitive.      Past Medical History  Diagnosis Date  . Hypertension   . Prostate cancer   . Spinal stenosis   . Peripheral neuropathy   . Diverticulosis   . Complete heart block     s/p PPM  . Vitamin D deficiency   . Pacemaker     PPM- St Jude  . Paroxysmal atrial fibrillation   . Contusion of face, scalp, and neck except eye(s) 04/23/2012  . Abnormal weight gain 04/12/2012  . Fall from other slipping, tripping, or stumbling 04/08/2012  . Congestive heart failure, unspecified 03/05/2012  . Shortness of breath 03/05/2012  . Unspecified hypertrophic and atrophic condition of skin  01/16/2012  . Sebaceous cyst 01/16/2012  . Unspecified constipation 11/28/2011  . Edema 04/12/2010  . Other specified cardiac dysrhythmias(427.89) 01/03/2010  . Other premature beats 06/02/2008  . Other malaise and fatigue 01/28/2008  . Nonspecific abnormal electrocardiogram (ECG) (EKG) 01/28/2008  . Internal hemorrhoids without mention of complication 06/03/2007  . Unspecified hearing loss 02/19/2007  . Osteoarthrosis, unspecified whether generalized or localized, unspecified site 02/19/2007  .  Left bundle branch hemiblock 06/04/1999  . Abnormality of gait 06/04/1999  . Aortic valve disorders 06/04/1979   Past Surgical History  Procedure Laterality Date  . Pacemaker insertion      PPM- St Jude-3/11  . Back surgery  2000    Spinal stenosis Dr. Trellis Paganini  . Nasal polyp excision    . Tonsillectomy    . Cataract extraction w/ intraocular lens  implant, bilateral  10/2009    Dr. Charlotte Sanes  . Pacemaker placement  01/05/2010  . Colonoscopy  07/25/2007    int hem. diverticulosis  Dr. Virginia Rochester   Social History:   reports that he quit smoking about 75 years ago. He has never used smokeless tobacco. He reports that he does not drink alcohol or use illicit drugs.  Family History  Problem Relation Age of Onset  . Coronary artery disease      family history  . Stroke Mother   . Heart disease Father   . Heart disease Brother   . Heart disease Brother   . Heart disease Brother   . Lung disease Brother     Medications: Patient's Medications  New Prescriptions   No medications on file  Previous Medications   CALCIUM CARB-CHOLECALCIFEROL 500-400 MG-UNIT TABS    Take by mouth. Take one tablet daily for calcium   CETIRIZINE (ZYRTEC) 10 MG TABLET    One daily to reduce itching   CHOLECALCIFEROL (VITAMIN D) 1000 UNITS CAPSULE    Take 1,000 Units by mouth. Take 2 capsules daily   DOCUSATE SODIUM (DIOCTO) 150 MG/15ML SYRUP    Take 50 mg by mouth daily. Use 3-5 drops in each ear at bedtime on Saturday and Sunday twice a month   DOXAZOSIN (CARDURA) 2 MG TABLET    Take 2 mg by mouth at bedtime.     GLUCOSAMINE-CHONDROITIN 500-400 MG TABLET    Take 1 tablet by mouth 3 (three) times daily.   LOSARTAN (COZAAR) 50 MG TABLET    daily.    RIVAROXABAN (XARELTO) 15 MG TABS TABLET    Take 1 tablet (15 mg total) by mouth daily.   SENNOSIDES-DOCUSATE SODIUM (SENOKOT-S) 8.6-50 MG TABLET    Take 1 tablet by mouth. Take one tablet as needed for constipation   TRIAMCINOLONE CREAM (KENALOG) 0.1 %    Apply to rash on  right chest, back, and other pruritic sites daily  Modified Medications   No medications on file  Discontinued Medications   No medications on file     Physical Exam: Physical Exam  Constitutional: He is oriented to person, place, and time. He appears well-developed and well-nourished. No distress.  HENT:  Head: Normocephalic and atraumatic.  Right Ear: External ear normal.  Left Ear: External ear normal.  Nose: Nose normal.  Mouth/Throat: Oropharynx is clear and moist. No oropharyngeal exudate.  Eyes: Conjunctivae and EOM are normal. Pupils are equal, round, and reactive to light. Right eye exhibits no discharge. Left eye exhibits no discharge. No scleral icterus.  Neck: Normal range of motion. Neck supple. No JVD present. No tracheal deviation present.  No thyromegaly present.  Cardiovascular: Normal rate, normal heart sounds and intact distal pulses.   No murmur heard. A-fib  Pulmonary/Chest: Effort normal and breath sounds normal. No stridor. No respiratory distress. He has no wheezes. He has no rales. He exhibits no tenderness.  Abdominal: Soft. Bowel sounds are normal. He exhibits no distension. There is no tenderness. There is no rebound and no guarding.  Musculoskeletal: Normal range of motion. He exhibits edema and tenderness.  Motor scooter for mobility.   Lymphadenopathy:    He has no cervical adenopathy.  Neurological: He is alert and oriented to person, place, and time. He has normal reflexes. He displays normal reflexes. No cranial nerve deficit. He exhibits abnormal muscle tone. Coordination normal.  Skin: Skin is dry. No rash noted. He is not diaphoretic. No erythema. No pallor.  Scratchy marks right arm and right neck.   Psychiatric: He has a normal mood and affect. His behavior is normal. Judgment and thought content normal.  Repetitive.     Filed Vitals:   09/09/13 1003  BP: 130/64  Pulse: 60  Height: 5\' 9"  (1.753 m)  Weight: 206 lb (93.441 kg)      Labs  reviewed: Basic Metabolic Panel:  Recent Labs  08/65/78 1202 02/11/13 1008 05/29/13 07/08/13  NA 137 135 141 141  K 4.3 3.9 3.9 3.9  CL 105 103  --   --   CO2 24 28  --   --   GLUCOSE 104* 108*  --   --   BUN 27* 26* 30* 25*  CREATININE 1.2 1.2 1.0 1.2  CALCIUM 9.1 8.8  --   --    Liver Function Tests:  Recent Labs  05/29/13  AST 13*  ALT 8*  ALKPHOS 50   CBC:  Recent Labs  11/21/12 1202 02/11/13 1008 05/29/13  WBC 7.7 7.5 6.2  NEUTROABS 5.8  --   --   HGB 12.6* 12.3* 11.1*  HCT 37.0* 36.3* 32*  MCV 92.7 93.2  --   PLT 188.0 175.0 200   Assessment/Plan ESSENTIAL HYPERTENSION, BENIGN Controlled on Cardura 2mg , Losartan 50mg ,   Atrial fibrillation Rate controlled without medications. Takes Xarelto, worries about bleeding. Hgb 11.1 05/29/13. Update CBC  Pacemaker-St.Jude Functional.   Gait disorder Motor scooter for mobility. Spinal stenosis and peripheral neuropathy all contribute to the problem.   Anemia of chronic disease Hgb dropped from 12 12/2012 to 11 05/2013. normocytic and normochromic in nature. Will continue to monitor CBC as well as the patient.    Unspecified constipation Stable on Colace senna daily.   Pruritus Still c/o itching and noted some scratchy marks right arm, right neck. Takes Zyrtec and topical hydrocortisone cream.     Family/ Staff Communication: observe the patient.   Goals of Care: [place]  Labs/tests ordered: CBC

## 2013-09-09 NOTE — Assessment & Plan Note (Signed)
Hgb dropped from 12 12/2012 to 11 05/2013. normocytic and normochromic in nature. Will continue to monitor CBC as well as the patient.

## 2013-09-09 NOTE — Assessment & Plan Note (Signed)
Stable on Colace senna daily.

## 2013-09-09 NOTE — Assessment & Plan Note (Signed)
Rate controlled without medications. Takes Xarelto, worries about bleeding. Hgb 11.1 05/29/13. Update CBC

## 2013-09-09 NOTE — Assessment & Plan Note (Signed)
Motor scooter for mobility. Spinal stenosis and peripheral neuropathy all contribute to the problem.

## 2013-09-09 NOTE — Assessment & Plan Note (Signed)
Still c/o itching and noted some scratchy marks right arm, right neck. Takes Zyrtec and topical hydrocortisone cream.

## 2013-10-23 DIAGNOSIS — R1310 Dysphagia, unspecified: Secondary | ICD-10-CM

## 2013-10-23 HISTORY — DX: Dysphagia, unspecified: R13.10

## 2013-10-24 ENCOUNTER — Encounter: Payer: Self-pay | Admitting: Internal Medicine

## 2013-11-20 ENCOUNTER — Encounter: Payer: Medicare Other | Admitting: Internal Medicine

## 2013-11-26 ENCOUNTER — Encounter: Payer: Medicare Other | Admitting: Internal Medicine

## 2013-12-19 ENCOUNTER — Encounter: Payer: Self-pay | Admitting: Internal Medicine

## 2013-12-19 ENCOUNTER — Ambulatory Visit (INDEPENDENT_AMBULATORY_CARE_PROVIDER_SITE_OTHER): Payer: Medicare Other | Admitting: Internal Medicine

## 2013-12-19 VITALS — BP 140/68 | HR 65 | Ht 69.0 in

## 2013-12-19 DIAGNOSIS — I10 Essential (primary) hypertension: Secondary | ICD-10-CM

## 2013-12-19 DIAGNOSIS — I441 Atrioventricular block, second degree: Secondary | ICD-10-CM

## 2013-12-19 DIAGNOSIS — Z950 Presence of cardiac pacemaker: Secondary | ICD-10-CM

## 2013-12-19 DIAGNOSIS — I4891 Unspecified atrial fibrillation: Secondary | ICD-10-CM

## 2013-12-19 DIAGNOSIS — I442 Atrioventricular block, complete: Secondary | ICD-10-CM

## 2013-12-19 LAB — MDC_IDC_ENUM_SESS_TYPE_INCLINIC
Battery Voltage: 2.89 V
Brady Statistic RA Percent Paced: 34 %
Implantable Pulse Generator Model: 2210
Lead Channel Impedance Value: 400 Ohm
Lead Channel Pacing Threshold Amplitude: 1 V
Lead Channel Pacing Threshold Amplitude: 1 V
Lead Channel Pacing Threshold Pulse Width: 0.5 ms
Lead Channel Pacing Threshold Pulse Width: 0.5 ms
Lead Channel Sensing Intrinsic Amplitude: 4.7 mV
Lead Channel Setting Pacing Amplitude: 1.25 V
Lead Channel Setting Pacing Amplitude: 2 V
Lead Channel Setting Pacing Pulse Width: 0.5 ms
MDC IDC MSMT BATTERY REMAINING LONGEVITY: 50.4 mo
MDC IDC MSMT LEADCHNL RA PACING THRESHOLD AMPLITUDE: 1 V
MDC IDC MSMT LEADCHNL RA PACING THRESHOLD PULSEWIDTH: 0.5 ms
MDC IDC MSMT LEADCHNL RV IMPEDANCE VALUE: 662.5 Ohm
MDC IDC MSMT LEADCHNL RV SENSING INTR AMPL: 12 mV
MDC IDC PG SERIAL: 7126761
MDC IDC SESS DTM: 20150227121141
MDC IDC SET LEADCHNL RV SENSING SENSITIVITY: 2.5 mV
MDC IDC STAT BRADY RV PERCENT PACED: 99 %

## 2013-12-19 NOTE — Patient Instructions (Signed)
Your physician wants you to follow-up in: 12 months with Dr Vallery Ridge will receive a reminder letter in the mail two months in advance. If you don't receive a letter, please call our office to schedule the follow-up appointment.   Remote monitoring is used to monitor your Pacemaker or ICD from home. This monitoring reduces the number of office visits required to check your device to one time per year. It allows [place] to keep an eye on the functioning of your device to ensure it is working properly. You are scheduled for a device check from home on 03/23/14. You may send your transmission at any time that day. If you have a wireless device, the transmission will be sent automatically. After your physician reviews your transmission, you will receive a postcard with your next transmission date.   Your physician recommends that you return for lab work today: BMP/CBC

## 2013-12-19 NOTE — Progress Notes (Signed)
PCP: Estill Dooms, MD  The patient presents today for routine electrophysiology followup.  Since last being seen in our clinic, the patient reports doing reasonalbly well.  Today, he denies symptoms of palpitations, chest pain, shortness of breath, orthopnea, PND  dizziness, presyncope, syncope, or neurologic sequela.  His edema is stable.  The patient feels that he is tolerating medications without difficulties and is otherwise without complaint today.   Past Medical History  Diagnosis Date  . Hypertension   . Prostate cancer   . Spinal stenosis   . Peripheral neuropathy   . Diverticulosis   . Complete heart block     s/p PPM  . Vitamin D deficiency   . Pacemaker     PPM- St Jude  . Paroxysmal atrial fibrillation   . Contusion of face, scalp, and neck except eye(s) 04/23/2012  . Abnormal weight gain 04/12/2012  . Fall from other slipping, tripping, or stumbling 04/08/2012  . Congestive heart failure, unspecified 03/05/2012  . Shortness of breath 03/05/2012  . Unspecified hypertrophic and atrophic condition of skin 01/16/2012  . Sebaceous cyst 01/16/2012  . Unspecified constipation 11/28/2011  . Edema 04/12/2010  . Other specified cardiac dysrhythmias(427.89) 01/03/2010  . Other premature beats 06/02/2008  . Other malaise and fatigue 01/28/2008  . Nonspecific abnormal electrocardiogram (ECG) (EKG) 01/28/2008  . Internal hemorrhoids without mention of complication 1/61/0960  . Unspecified hearing loss 02/19/2007  . Osteoarthrosis, unspecified whether generalized or localized, unspecified site 02/19/2007  . Left bundle branch hemiblock 06/04/1999  . Abnormality of gait 06/04/1999  . Aortic valve disorders 06/04/1979   Past Surgical History  Procedure Laterality Date  . Pacemaker insertion      PPM- St Jude-3/11  . Back surgery  2000    Spinal stenosis Dr. Achilles Dunk  . Nasal polyp excision    . Tonsillectomy    . Cataract extraction w/ intraocular lens  implant, bilateral  10/2009    Dr. Ellie Lunch   . Pacemaker placement  01/05/2010  . Colonoscopy  07/25/2007    int hem. diverticulosis  Dr. Lajoyce Corners    Current Outpatient Prescriptions  Medication Sig Dispense Refill  . Calcium Carbonate-Vitamin D (CALTRATE 600+D) 600-400 MG-UNIT per tablet Take 1 tablet by mouth daily.      . Cholecalciferol (VITAMIN D) 1000 UNITS capsule Take 2 capsules daily      . clobetasol cream (TEMOVATE) 4.54 % Apply 1 application topically as needed.      . doxazosin (CARDURA) 2 MG tablet Take 2 mg by mouth daily.      Marland Kitchen gabapentin (NEURONTIN) 100 MG capsule Take 100 mg by mouth at bedtime.      . Glucosamine 500 MG CAPS Take 1 capsule by mouth daily.      Marland Kitchen olmesartan-hydrochlorothiazide (BENICAR HCT) 40-12.5 MG per tablet Take 1 tablet by mouth daily.      . Rivaroxaban (XARELTO) 15 MG TABS tablet Take 1 tablet (15 mg total) by mouth daily.  90 tablet  1  . sennosides-docusate sodium (SENOKOT-S) 8.6-50 MG tablet Take 2 tablets by mouth at bedtime.        No current facility-administered medications for this visit.    Allergies  Allergen Reactions  . Accupril [Quinapril Hcl]   . Altace [Ramipril]   . Fosinopril Sodium   . Hctz [Hydrochlorothiazide]   . Inderal [Propranolol]   . Meloxicam   . Monopril [Fosinopril]   . Propranolol Hcl   . Quinapril Hcl   . Reserpine  History   Social History  . Marital Status: Married    Spouse Name: N/A    Number of Children: N/A  . Years of Education: N/A   Occupational History  . marketing executive--retired     [employer]   Social History Main Topics  . Smoking status: Former Smoker -- 78 years    Quit date: 10/23/1937  . Smokeless tobacco: Never Used  . Alcohol Use: No  . Drug Use: No  . Sexual Activity: No   Other Topics Concern  . Not on file   Social History Narrative   Exercises 3 times  A week at exercise class.    Family History  Problem Relation Age of Onset  . Coronary artery disease      family history  . Stroke Mother    . Heart disease Father   . Heart disease Brother   . Heart disease Brother   . Heart disease Brother   . Lung disease Brother      Physical Exam: Filed Vitals:   12/19/13 1033  BP: 140/68  Pulse: 65  Height: 5\' 9"  (1.753 m)    GEN- The patient is elderly appearing, alert and oriented x 3 today.   Head- normocephalic, atraumatic Eyes-  Sclera clear, conjunctiva pink Ears- hearing intact Oropharynx- clear Neck- supple,  Lungs- Clear to ausculation bilaterally, normal work of breathing Chest- pacemaker pocket is well healed Heart- Regular rate and rhythm, 2/6 SEM LUSB (late peaking but stable,  S2 is audible) GI- soft, NT, ND, + BS Extremities- no clubbing, cyanosis, +1 edema Walks slowly with a rolling walker  Pacemaker interrogation- reviewed in detail today,  See PACEART report  Assessment and Plan:  1. Complete heart block Normal pacemaker function See Pace Art report No changes today  2. htn Stable No change required today  3. afib Stable No change required today chads2vasc is at least 3.  Continue xarelto long term bmet and cbc today  4. Aortic stenosis Not a surgical candidate  Merlin Return in 12 months

## 2014-02-03 ENCOUNTER — Encounter: Payer: Self-pay | Admitting: *Deleted

## 2014-03-06 ENCOUNTER — Encounter: Payer: Self-pay | Admitting: *Deleted

## 2014-03-13 ENCOUNTER — Non-Acute Institutional Stay: Payer: Medicare Other | Admitting: Nurse Practitioner

## 2014-03-13 ENCOUNTER — Encounter: Payer: Self-pay | Admitting: Nurse Practitioner

## 2014-03-13 DIAGNOSIS — I4891 Unspecified atrial fibrillation: Secondary | ICD-10-CM

## 2014-03-13 DIAGNOSIS — D638 Anemia in other chronic diseases classified elsewhere: Secondary | ICD-10-CM

## 2014-03-13 DIAGNOSIS — L299 Pruritus, unspecified: Secondary | ICD-10-CM

## 2014-03-13 DIAGNOSIS — I10 Essential (primary) hypertension: Secondary | ICD-10-CM

## 2014-03-13 DIAGNOSIS — R4182 Altered mental status, unspecified: Secondary | ICD-10-CM

## 2014-03-13 DIAGNOSIS — R509 Fever, unspecified: Secondary | ICD-10-CM | POA: Insufficient documentation

## 2014-03-13 DIAGNOSIS — D72829 Elevated white blood cell count, unspecified: Secondary | ICD-10-CM | POA: Insufficient documentation

## 2014-03-13 LAB — BASIC METABOLIC PANEL
BUN: 29 mg/dL — AB (ref 4–21)
Creatinine: 1 mg/dL (ref <=1.3)
GLUCOSE: 110 mg/dL
Potassium: 4.3 mmol/L (ref 3.4–5.3)
Sodium: 138 mmol/L (ref 137–147)

## 2014-03-13 LAB — CBC AND DIFFERENTIAL
HEMATOCRIT: 33 % — AB (ref 41–53)
Hemoglobin: 11.6 g/dL — AB (ref 13.5–17.5)
PLATELETS: 208 10*3/uL (ref 150–399)
WBC: 17.2 10^3/mL

## 2014-03-13 LAB — TSH: TSH: 1.08 u[IU]/mL (ref <=5.90)

## 2014-03-13 LAB — HEPATIC FUNCTION PANEL
ALK PHOS: 49 U/L (ref 25–125)
ALT: 18 U/L (ref 10–40)
AST: 32 U/L (ref 14–40)
BILIRUBIN, TOTAL: 0.7 mg/dL

## 2014-03-13 NOTE — Assessment & Plan Note (Signed)
Stable, Hgb 11.6 03/13/14

## 2014-03-13 NOTE — Assessment & Plan Note (Signed)
Rate controlled. Takes Xarelto for thromboembolic events risk reduction.  

## 2014-03-13 NOTE — Assessment & Plan Note (Signed)
48 hours-accompanied with elevated wcb 17.2-will obtain blood culture x3 if T>101. Obtain CXR(no O2 desaturation). Empirical broad spectrum ABT-Levaquin 500mg  daily x10 days.

## 2014-03-13 NOTE — Progress Notes (Signed)
Patient ID: Nicholas Hood, male   DOB: 03-Oct-1916, 80 y.o.   MRN: [id number]     Allergies  Allergen Reactions  . Accupril [Quinapril Hcl]   . Altace [Ramipril]   . Fosinopril Sodium   . Hctz [Hydrochlorothiazide]   . Inderal [Propranolol]   . Mobic [Meloxicam]   . Monopril [Fosinopril]   . Propranolol Hcl   . Quinapril Hcl   . Reserpine     Chief Complaint  Patient presents with  . Medical Management of Chronic Issues  . Acute Visit    fever, confusion, elevated white count.     HPI: Patient is a 80 y.o. male seen in the clinic at [facility] today for fever, confusion, elevated wbc,  and other chronic medical conditions.  Problem List Items Addressed This Visit   Altered mental status     Mild increased confusion vs acute delirium related to fever: no focal neurological deficits-will continue to monitor the patient while empirical ABT for fever and elevated white count    Anemia of chronic disease     Stable, Hgb 11.6 03/13/14    Atrial fibrillation     Rate controlled. Takes Xarelto for thromboembolic events risk reduction.     ESSENTIAL HYPERTENSION, BENIGN     Controlled. Takes Losartan 50mg  daily.     Fever, unspecified     48 hours-accompanied with elevated wcb 17.2-will obtain blood culture x3 if T>101. Obtain CXR(no O2 desaturation). Empirical broad spectrum ABT-Levaquin 500mg  daily x10 days.     Leukocytosis, unspecified - Primary     Wbc 17.2, gran 86%, absolute gran 14.8. UA, CMP, TSH 03/13/14 unremarkable. No noted respiratory symptoms. Presenting symptoms: T 100-101 x 48hours, generalized weakness, slid out of recliner 03/09/14, and increased confusion. Will treat with empirical ABT-Levaquin 500mg  daily x 10 days. May consider blood culture x3 if T>101    Pruritus     Mild chronic itching-will hold Claritin for 7 days due to confusion and to observe its efficacy.       Review of Systems:  Review of Systems  Constitutional: Positive for fever and  malaise/fatigue. Negative for chills, weight loss and diaphoresis.  HENT: Positive for hearing loss. Negative for congestion, ear discharge, ear pain, nosebleeds and tinnitus.   Eyes: Negative for blurred vision, double vision, photophobia, pain and discharge.  Respiratory: Negative for cough, hemoptysis, sputum production, shortness of breath, wheezing and stridor.   Cardiovascular: Negative for chest pain, palpitations, orthopnea, claudication, leg swelling and PND.  Gastrointestinal: Positive for constipation. Negative for heartburn, nausea, vomiting, abdominal pain, diarrhea and blood in stool.  Genitourinary: Positive for frequency. Negative for dysuria, urgency, hematuria and flank pain.  Musculoskeletal: Negative for back pain, falls, joint pain, myalgias and neck pain.  Skin: Positive for itching.       Scratchy marks right neck and arm.   Neurological: Positive for tremors. Negative for dizziness, tingling, sensory change, speech change, focal weakness, seizures, loss of consciousness, weakness and headaches.       Noted resting tremor in the right fingers  Endo/Heme/Allergies: Positive for environmental allergies. Negative for polydipsia. Bruises/bleeds easily.  Psychiatric/Behavioral: Positive for memory loss. Negative for depression, suicidal ideas, hallucinations and substance abuse. The patient is not nervous/anxious and does not have insomnia.        Repetitive. Increased confusion     Past Medical History  Diagnosis Date  . Hypertension   . Prostate cancer   . Spinal stenosis   . Peripheral neuropathy   .  Diverticulosis   . Complete heart block     s/p PPM  . Vitamin D deficiency   . Pacemaker     PPM- St Jude  . Paroxysmal atrial fibrillation   . Contusion of face, scalp, and neck except eye(s) 04/23/2012  . Abnormal weight gain 04/12/2012  . Fall from other slipping, tripping, or stumbling 04/08/2012  . Congestive heart failure, unspecified 03/05/2012  . Shortness of  breath 03/05/2012  . Unspecified hypertrophic and atrophic condition of skin 01/16/2012  . Sebaceous cyst 01/16/2012  . Unspecified constipation 11/28/2011  . Edema 04/12/2010  . Other specified cardiac dysrhythmias(427.89) 01/03/2010  . Other premature beats 06/02/2008  . Other malaise and fatigue 01/28/2008  . Nonspecific abnormal electrocardiogram (ECG) (EKG) 01/28/2008  . Internal hemorrhoids without mention of complication 06/09/2992  . Unspecified hearing loss 02/19/2007  . Osteoarthrosis, unspecified whether generalized or localized, unspecified site 02/19/2007  . Left bundle branch hemiblock 06/04/1999  . Abnormality of gait 06/04/1999  . Aortic valve disorders 06/04/1979   Past Surgical History  Procedure Laterality Date  . Pacemaker insertion      PPM- St Jude-3/11  . Back surgery  2000    Spinal stenosis Dr. Achilles Dunk  . Nasal polyp excision    . Tonsillectomy    . Cataract extraction w/ intraocular lens  implant, bilateral  10/2009    Dr. Ellie Lunch  . Pacemaker placement  01/05/2010  . Colonoscopy  07/25/2007    int hem. diverticulosis  Dr. Lajoyce Corners   Social History:   reports that he quit smoking about 76 years ago. He has never used smokeless tobacco. He reports that he does not drink alcohol or use illicit drugs.  Family History  Problem Relation Age of Onset  . Coronary artery disease      family history  . Stroke Mother   . Heart disease Father   . Heart disease Brother   . Heart disease Brother   . Heart disease Brother   . Lung disease Brother     Medications: Patient's Medications  New Prescriptions   No medications on file  Previous Medications   CALCIUM CARBONATE-VITAMIN D (CALTRATE 600+D) 600-400 MG-UNIT PER TABLET    Take 1 tablet by mouth daily.   CETIRIZINE (ZYRTEC ALLERGY) 10 MG TABLET    Take 10 mg by mouth daily. To help with itching   CHOLECALCIFEROL (VITAMIN D) 1000 UNITS CAPSULE    Take 2 capsules daily   DOCUSATE (COLACE) 50 MG/5ML LIQUID    Instill 3-5 drops  into each ear at bedtime on Saturday & Sunday twice a month.   DOXAZOSIN (CARDURA) 2 MG TABLET    Take 2 mg by mouth daily.   GLUCOSAMINE 500 MG CAPS    Take 3 capsules by mouth daily.    LOSARTAN (COZAAR) 50 MG TABLET    Take 50 mg by mouth daily. For Hypertention   RIVAROXABAN (XARELTO) 15 MG TABS TABLET    Take 1 tablet (15 mg total) by mouth daily.  Modified Medications   No medications on file  Discontinued Medications   No medications on file     Physical Exam: Physical Exam  Constitutional: He is oriented to person, place, and time. He appears well-developed and well-nourished. No distress.  HENT:  Head: Normocephalic and atraumatic.  Right Ear: External ear normal.  Left Ear: External ear normal.  Nose: Nose normal.  Mouth/Throat: Oropharynx is clear and moist. No oropharyngeal exudate.  Eyes: Conjunctivae and EOM are normal. Pupils  are equal, round, and reactive to light. Right eye exhibits no discharge. Left eye exhibits no discharge. No scleral icterus.  Neck: Normal range of motion. Neck supple. No JVD present. No tracheal deviation present. No thyromegaly present.  Cardiovascular: Normal rate, normal heart sounds and intact distal pulses.   No murmur heard. A-fib  Pulmonary/Chest: Effort normal and breath sounds normal. No stridor. No respiratory distress. He has no wheezes. He has no rales. He exhibits no tenderness.  Abdominal: Soft. Bowel sounds are normal. He exhibits no distension. There is no tenderness. There is no rebound and no guarding.  Musculoskeletal: Normal range of motion. He exhibits edema and tenderness.  Motor scooter for mobility.   Lymphadenopathy:    He has no cervical adenopathy.  Neurological: He is alert and oriented to person, place, and time. He has normal reflexes. No cranial nerve deficit. He exhibits abnormal muscle tone. Coordination normal.  Skin: Skin is dry. No rash noted. He is not diaphoretic. No erythema. No pallor.  Scratchy marks  right arm and right neck.   Psychiatric: He has a normal mood and affect. His behavior is normal. Judgment and thought content normal.  Repetitive. confusion    Filed Vitals:   03/13/14 1109  BP: 112/67  Pulse: 73  Temp: 100 F (37.8 C)  TempSrc: Tympanic  Resp: 18      Labs reviewed: Basic Metabolic Panel:  Recent Labs  05/29/13 07/08/13 03/13/14  NA 141 141 138  K 3.9 3.9 4.3  BUN 30* 25* 29*  CREATININE 1.0 1.2 1.0  TSH  --   --  1.08   Liver Function Tests:  Recent Labs  05/29/13 03/13/14  AST 13* 32  ALT 8* 18  ALKPHOS 50 49   CBC:  Recent Labs  05/29/13 03/13/14  WBC 6.2 17.2  HGB 11.1* 11.6*  HCT 32* 33*  PLT 200 208   Assessment/Plan Leukocytosis, unspecified Wbc 17.2, gran 86%, absolute gran 14.8. UA, CMP, TSH 03/13/14 unremarkable. No noted respiratory symptoms. Presenting symptoms: T 100-101 x 48hours, generalized weakness, slid out of recliner 03/09/14, and increased confusion. Will treat with empirical ABT-Levaquin 500mg  daily x 10 days. May consider blood culture x3 if T>101  Pruritus Mild chronic itching-will hold Claritin for 7 days due to confusion and to observe its efficacy.  Anemia of chronic disease Stable, Hgb 11.6 03/13/14  Atrial fibrillation Rate controlled. Takes Xarelto for thromboembolic events risk reduction.   ESSENTIAL HYPERTENSION, BENIGN Controlled. Takes Losartan 50mg  daily.   Fever, unspecified 48 hours-accompanied with elevated wcb 17.2-will obtain blood culture x3 if T>101. Obtain CXR(no O2 desaturation). Empirical broad spectrum ABT-Levaquin 500mg  daily x10 days.   Altered mental status Mild increased confusion vs acute delirium related to fever: no focal neurological deficits-will continue to monitor the patient while empirical ABT for fever and elevated white count    Family/ Staff Communication: observe the patient.   Goals of Care: AL  Labs/tests ordered: CBC, CMP, TSH done 03/13/14. CXR

## 2014-03-13 NOTE — Assessment & Plan Note (Signed)
Mild increased confusion vs acute delirium related to fever: no focal neurological deficits-will continue to monitor the patient while empirical ABT for fever and elevated white count

## 2014-03-13 NOTE — Assessment & Plan Note (Signed)
Mild chronic itching-will hold Claritin for 7 days due to confusion and to observe its efficacy.

## 2014-03-13 NOTE — Assessment & Plan Note (Signed)
Wbc 17.2, gran 86%, absolute gran 14.8. UA, CMP, TSH 03/13/14 unremarkable. No noted respiratory symptoms. Presenting symptoms: T 100-101 x 48hours, generalized weakness, slid out of recliner 03/09/14, and increased confusion. Will treat with empirical ABT-Levaquin 500mg  daily x 10 days. May consider blood culture x3 if T>101

## 2014-03-13 NOTE — Assessment & Plan Note (Signed)
Controlled. Takes Losartan 50mg  daily.

## 2014-03-14 ENCOUNTER — Emergency Department (HOSPITAL_COMMUNITY): Payer: Medicare Other

## 2014-03-14 ENCOUNTER — Inpatient Hospital Stay (HOSPITAL_COMMUNITY)
Admission: EM | Admit: 2014-03-14 | Discharge: 2014-03-20 | DRG: 871 | Disposition: A | Discharge status: Skilled Nursing Facility | Payer: Medicare Other | Attending: Internal Medicine | Admitting: Internal Medicine

## 2014-03-14 ENCOUNTER — Encounter (HOSPITAL_COMMUNITY): Payer: Self-pay | Admitting: Emergency Medicine

## 2014-03-14 DIAGNOSIS — Z950 Presence of cardiac pacemaker: Secondary | ICD-10-CM

## 2014-03-14 DIAGNOSIS — R410 Disorientation, unspecified: Secondary | ICD-10-CM | POA: Diagnosis present

## 2014-03-14 DIAGNOSIS — D638 Anemia in other chronic diseases classified elsewhere: Secondary | ICD-10-CM | POA: Diagnosis present

## 2014-03-14 DIAGNOSIS — G9340 Encephalopathy, unspecified: Secondary | ICD-10-CM

## 2014-03-14 DIAGNOSIS — N390 Urinary tract infection, site not specified: Secondary | ICD-10-CM

## 2014-03-14 DIAGNOSIS — R269 Unspecified abnormalities of gait and mobility: Secondary | ICD-10-CM

## 2014-03-14 DIAGNOSIS — R21 Rash and other nonspecific skin eruption: Secondary | ICD-10-CM

## 2014-03-14 DIAGNOSIS — I10 Essential (primary) hypertension: Secondary | ICD-10-CM | POA: Diagnosis present

## 2014-03-14 DIAGNOSIS — K5730 Diverticulosis of large intestine without perforation or abscess without bleeding: Secondary | ICD-10-CM

## 2014-03-14 DIAGNOSIS — I4892 Unspecified atrial flutter: Secondary | ICD-10-CM | POA: Diagnosis present

## 2014-03-14 DIAGNOSIS — R509 Fever, unspecified: Secondary | ICD-10-CM | POA: Diagnosis present

## 2014-03-14 DIAGNOSIS — L299 Pruritus, unspecified: Secondary | ICD-10-CM

## 2014-03-14 DIAGNOSIS — K648 Other hemorrhoids: Secondary | ICD-10-CM

## 2014-03-14 DIAGNOSIS — H9190 Unspecified hearing loss, unspecified ear: Secondary | ICD-10-CM | POA: Diagnosis present

## 2014-03-14 DIAGNOSIS — M4800 Spinal stenosis, site unspecified: Secondary | ICD-10-CM

## 2014-03-14 DIAGNOSIS — I4891 Unspecified atrial fibrillation: Secondary | ICD-10-CM | POA: Diagnosis present

## 2014-03-14 DIAGNOSIS — Z8249 Family history of ischemic heart disease and other diseases of the circulatory system: Secondary | ICD-10-CM

## 2014-03-14 DIAGNOSIS — A419 Sepsis, unspecified organism: Secondary | ICD-10-CM

## 2014-03-14 DIAGNOSIS — I442 Atrioventricular block, complete: Secondary | ICD-10-CM

## 2014-03-14 DIAGNOSIS — G609 Hereditary and idiopathic neuropathy, unspecified: Secondary | ICD-10-CM | POA: Diagnosis present

## 2014-03-14 DIAGNOSIS — R4182 Altered mental status, unspecified: Secondary | ICD-10-CM | POA: Diagnosis present

## 2014-03-14 DIAGNOSIS — Z7901 Long term (current) use of anticoagulants: Secondary | ICD-10-CM

## 2014-03-14 DIAGNOSIS — Z886 Allergy status to analgesic agent status: Secondary | ICD-10-CM

## 2014-03-14 DIAGNOSIS — E559 Vitamin D deficiency, unspecified: Secondary | ICD-10-CM | POA: Diagnosis present

## 2014-03-14 DIAGNOSIS — C61 Malignant neoplasm of prostate: Secondary | ICD-10-CM

## 2014-03-14 DIAGNOSIS — Z66 Do not resuscitate: Secondary | ICD-10-CM | POA: Diagnosis present

## 2014-03-14 DIAGNOSIS — G9341 Metabolic encephalopathy: Secondary | ICD-10-CM | POA: Diagnosis present

## 2014-03-14 DIAGNOSIS — I509 Heart failure, unspecified: Secondary | ICD-10-CM | POA: Diagnosis present

## 2014-03-14 DIAGNOSIS — A4150 Gram-negative sepsis, unspecified: Principal | ICD-10-CM | POA: Diagnosis present

## 2014-03-14 DIAGNOSIS — D72829 Elevated white blood cell count, unspecified: Secondary | ICD-10-CM

## 2014-03-14 DIAGNOSIS — Z888 Allergy status to other drugs, medicaments and biological substances status: Secondary | ICD-10-CM

## 2014-03-14 DIAGNOSIS — Z87891 Personal history of nicotine dependence: Secondary | ICD-10-CM

## 2014-03-14 DIAGNOSIS — Z8546 Personal history of malignant neoplasm of prostate: Secondary | ICD-10-CM

## 2014-03-14 DIAGNOSIS — I447 Left bundle-branch block, unspecified: Secondary | ICD-10-CM

## 2014-03-14 DIAGNOSIS — Z823 Family history of stroke: Secondary | ICD-10-CM

## 2014-03-14 DIAGNOSIS — R404 Transient alteration of awareness: Secondary | ICD-10-CM | POA: Diagnosis present

## 2014-03-14 DIAGNOSIS — R011 Cardiac murmur, unspecified: Secondary | ICD-10-CM

## 2014-03-14 DIAGNOSIS — K5900 Constipation, unspecified: Secondary | ICD-10-CM

## 2014-03-14 HISTORY — DX: Sepsis, unspecified organism: A41.9

## 2014-03-14 HISTORY — DX: Disorientation, unspecified: R41.0

## 2014-03-14 HISTORY — DX: Urinary tract infection, site not specified: N39.0

## 2014-03-14 LAB — URINALYSIS, ROUTINE W REFLEX MICROSCOPIC
Bilirubin Urine: NEGATIVE
Glucose, UA: NEGATIVE mg/dL
KETONES UR: NEGATIVE mg/dL
Nitrite: NEGATIVE
PROTEIN: NEGATIVE mg/dL
Specific Gravity, Urine: 1.018 (ref 1.005–1.030)
Urobilinogen, UA: 0.2 mg/dL (ref 0.0–1.0)
pH: 5 (ref 5.0–8.0)

## 2014-03-14 LAB — COMPREHENSIVE METABOLIC PANEL
ALBUMIN: 2.3 g/dL — AB (ref 3.5–5.2)
ALT: 17 U/L (ref 0–53)
AST: 21 U/L (ref 0–37)
Alkaline Phosphatase: 59 U/L (ref 39–117)
BILIRUBIN TOTAL: 0.3 mg/dL (ref 0.3–1.2)
BUN: 28 mg/dL — ABNORMAL HIGH (ref 6–23)
CALCIUM: 8.5 mg/dL (ref 8.4–10.5)
CHLORIDE: 105 meq/L (ref 96–112)
CO2: 22 mEq/L (ref 19–32)
CREATININE: 0.99 mg/dL (ref 0.50–1.35)
GFR calc Af Amer: 78 mL/min — ABNORMAL LOW (ref >90)
GFR calc non Af Amer: 67 mL/min — ABNORMAL LOW (ref >90)
Glucose, Bld: 98 mg/dL (ref 70–99)
Potassium: 4.2 mEq/L (ref 3.7–5.3)
Sodium: 138 mEq/L (ref 137–147)
Total Protein: 7.1 g/dL (ref 6.0–8.3)

## 2014-03-14 LAB — CBC WITH DIFFERENTIAL/PLATELET
BASOS PCT: 0 % (ref 0–1)
Basophils Absolute: 0 10*3/uL (ref 0.0–0.1)
EOS PCT: 2 % (ref 0–5)
Eosinophils Absolute: 0.3 10*3/uL (ref 0.0–0.7)
HEMATOCRIT: 30.5 % — AB (ref 39.0–52.0)
HEMOGLOBIN: 9.8 g/dL — AB (ref 13.0–17.0)
Lymphocytes Relative: 6 % — ABNORMAL LOW (ref 12–46)
Lymphs Abs: 0.7 10*3/uL (ref 0.7–4.0)
MCH: 30.1 pg (ref 26.0–34.0)
MCHC: 32.1 g/dL (ref 30.0–36.0)
MCV: 93.6 fL (ref 78.0–100.0)
MONO ABS: 1.2 10*3/uL — AB (ref 0.1–1.0)
MONOS PCT: 9 % (ref 3–12)
Neutro Abs: 11.2 10*3/uL — ABNORMAL HIGH (ref 1.7–7.7)
Neutrophils Relative %: 83 % — ABNORMAL HIGH (ref 43–77)
Platelets: 207 10*3/uL (ref 150–400)
RBC: 3.26 MIL/uL — ABNORMAL LOW (ref 4.22–5.81)
RDW: 14.4 % (ref 11.5–15.5)
WBC: 13.3 10*3/uL — ABNORMAL HIGH (ref 4.0–10.5)

## 2014-03-14 LAB — URINE MICROSCOPIC-ADD ON

## 2014-03-14 LAB — I-STAT CG4 LACTIC ACID, ED: LACTIC ACID, VENOUS: 1.15 mmol/L (ref 0.5–2.2)

## 2014-03-14 MED ORDER — DEXTROSE 5 % IV SOLN: 1.0000 g | Freq: Once | INTRAVENOUS | Status: DC

## 2014-03-14 MED ORDER — PIPERACILLIN-TAZOBACTAM 3.375 G IVPB 30 MIN
3.3750 g | Freq: Once | INTRAVENOUS | Status: AC
  Administered 2014-03-14: 3.375 g via INTRAVENOUS
  Filled 2014-03-14: qty 50

## 2014-03-14 MED ORDER — ALBUTEROL SULFATE (2.5 MG/3ML) 0.083% IN NEBU
5.0000 mg | INHALATION_SOLUTION | Freq: Once | RESPIRATORY_TRACT | Status: AC
  Administered 2014-03-14: 5 mg via RESPIRATORY_TRACT
  Filled 2014-03-14: qty 6

## 2014-03-14 MED ORDER — SODIUM CHLORIDE 0.9 % IV SOLN
1000.0000 mL | INTRAVENOUS | Status: DC
  Administered 2014-03-14: 1000 mL via INTRAVENOUS

## 2014-03-14 MED ORDER — SODIUM CHLORIDE 0.9 % IV SOLN
1000.0000 mL | Freq: Once | INTRAVENOUS | Status: AC
  Administered 2014-03-14: 1000 mL via INTRAVENOUS

## 2014-03-14 MED ORDER — VANCOMYCIN HCL IN DEXTROSE 1-5 GM/200ML-% IV SOLN
1000.0000 mg | Freq: Once | INTRAVENOUS | Status: DC
  Administered 2014-03-14: 1000 mg via INTRAVENOUS
  Filled 2014-03-14: qty 200

## 2014-03-14 MED ORDER — ALBUTEROL SULFATE (2.5 MG/3ML) 0.083% IN NEBU
INHALATION_SOLUTION | RESPIRATORY_TRACT | Status: AC
  Filled 2014-03-14: qty 6

## 2014-03-14 NOTE — ED Notes (Signed)
Patient transported to CT 

## 2014-03-14 NOTE — H&P (Signed)
PCP:   Estill Dooms, MD   Chief Complaint:  fever  HPI: 80 yo male lives at snf was started on levaquin 2 days ago for uti.  That urine cx grew out "contaminate" per son who is present with him in ED.  Today he got more confused and spiked a fever up to 102.  He has been coughing for several days.  No rashes.  No n/v/d.  Pt is normally sharp mentation and ambulates with assistance with walker.  He is still slighltly confused but denies any pain anywhere.  Review of Systems:  Positive and negative as per HPI otherwise all other systems are negative per son who is present  Past Medical History: Past Medical History  Diagnosis Date  . Hypertension   . Prostate cancer   . Spinal stenosis   . Peripheral neuropathy   . Diverticulosis   . Complete heart block     s/p PPM  . Vitamin D deficiency   . Pacemaker     PPM- St Jude  . Paroxysmal atrial fibrillation   . Contusion of face, scalp, and neck except eye(s) 04/23/2012  . Abnormal weight gain 04/12/2012  . Fall from other slipping, tripping, or stumbling 04/08/2012  . Congestive heart failure, unspecified 03/05/2012  . Shortness of breath 03/05/2012  . Unspecified hypertrophic and atrophic condition of skin 01/16/2012  . Sebaceous cyst 01/16/2012  . Unspecified constipation 11/28/2011  . Edema 04/12/2010  . Other specified cardiac dysrhythmias(427.89) 01/03/2010  . Other premature beats 06/02/2008  . Other malaise and fatigue 01/28/2008  . Nonspecific abnormal electrocardiogram (ECG) (EKG) 01/28/2008  . Internal hemorrhoids without mention of complication 9/73/5329  . Unspecified hearing loss 02/19/2007  . Osteoarthrosis, unspecified whether generalized or localized, unspecified site 02/19/2007  . Left bundle branch hemiblock 06/04/1999  . Abnormality of gait 06/04/1999  . Aortic valve disorders 06/04/1979   Past Surgical History  Procedure Laterality Date  . Pacemaker insertion      PPM- St Jude-3/11  . Back surgery  2000    Spinal  stenosis Dr. Achilles Dunk  . Nasal polyp excision    . Tonsillectomy    . Cataract extraction w/ intraocular lens  implant, bilateral  10/2009    Dr. Ellie Lunch  . Pacemaker placement  01/05/2010  . Colonoscopy  07/25/2007    int hem. diverticulosis  Dr. Lajoyce Corners    Medications: Prior to Admission medications   Medication Sig Start Date End Date Taking? Authorizing Provider  acetaminophen (TYLENOL) 325 MG tablet Take 650 mg by mouth every 4 (four) hours as needed for fever (temp over 100).   Yes Historical Provider, MD  Calcium Carb-Cholecalciferol (OYSTER SHELL CALCIUM + D) 500-400 MG-UNIT TABS Take 1 tablet by mouth daily.   Yes Historical Provider, MD  cholecalciferol (VITAMIN D) 1000 UNITS tablet Take 2,000 Units by mouth daily.   Yes Historical Provider, MD  Docusate Sodium (DIOCTO) 150 MG/15ML syrup Place into both ears See admin instructions. Instill 3-5 drops into each ear at bedtime on Saturday and Sunday (twice a month)   Yes Historical Provider, MD  doxazosin (CARDURA) 2 MG tablet Take 2 mg by mouth daily.   Yes Historical Provider, MD  Glucosamine-Chondroitin 500-400 MG CAPS Take 3 capsules by mouth daily.   Yes Historical Provider, MD  levofloxacin (LEVAQUIN) 500 MG tablet Take 500 mg by mouth daily. 10 day course started 03/13/14   Yes Historical Provider, MD  losartan (COZAAR) 50 MG tablet Take 50 mg by mouth daily.  For Hypertention   Yes Historical Provider, MD  Rivaroxaban (XARELTO) 15 MG TABS tablet Take 15 mg by mouth daily after supper.   Yes Historical Provider, MD  cetirizine (ZYRTEC ALLERGY) 10 MG tablet Take 10 mg by mouth daily. To help with itching    Historical Provider, MD    Allergies:   Allergies  Allergen Reactions  . Accupril [Quinapril Hcl]     Per MAR  . Alacepril     Per MAR  . Altace [Ramipril]     Unknown reaction   . Hctz [Hydrochlorothiazide]     Unknown reaction  . Inderal [Propranolol]     Per MAR  . Mobic [Meloxicam]     Per MAR  . Monopril  [Fosinopril]     Per MAR  . Quinapril Hcl     Unknown reaction  . Reserpine     Per MAR    Social History:  reports that he quit smoking about 76 years ago. He has never used smokeless tobacco. He reports that he does not drink alcohol or use illicit drugs.  Family History: Family History  Problem Relation Age of Onset  . Coronary artery disease      family history  . Stroke Mother   . Heart disease Father   . Heart disease Brother   . Heart disease Brother   . Heart disease Brother   . Lung disease Brother     Physical Exam: Filed Vitals:   03/14/14 2024 03/14/14 2133 03/14/14 2206 03/14/14 2244  BP:   111/88 116/73  Pulse:   84   Temp: 97.6 F (36.4 C)  98.4 F (36.9 C)   TempSrc: Oral  Oral   Resp:   36 15  Height:      Weight:      SpO2:  97% 97% 95%   General appearance: alert, cooperative, delirious and no distress Head: Normocephalic, without obvious abnormality, atraumatic Eyes: negative Nose: Nares normal. Septum midline. Mucosa normal. No drainage or sinus tenderness. Neck: no JVD and supple, symmetrical, trachea midline Lungs: clear to auscultation bilaterally Heart: regular rate and rhythm, S1, S2 normal, no murmur, click, rub or gallop Abdomen: soft, non-tender; bowel sounds normal; no masses,  no organomegaly Extremities: extremities normal, atraumatic, no cyanosis or edema Pulses: 2+ and symmetric Skin: Skin color, texture, turgor normal. No rashes or lesions Neurologic: Grossly normal    Labs on Admission:   Recent Labs  03/13/14 03/14/14 1800  NA 138 138  K 4.3 4.2  CL  --  105  CO2  --  22  GLUCOSE  --  98  BUN 29* 28*  CREATININE 1.0 0.99  CALCIUM  --  8.5    Recent Labs  03/13/14 03/14/14 1800  AST 32 21  ALT 18 17  ALKPHOS 49 59  BILITOT  --  0.3  PROT  --  7.1  ALBUMIN  --  2.3*    Recent Labs  03/13/14 03/14/14 1800  WBC 17.2 13.3*  NEUTROABS  --  11.2*  HGB 11.6* 9.8*  HCT 33* 30.5*  MCV  --  93.6  PLT 208  207    Recent Labs  03/13/14  TSH 1.08   Radiological Exams on Admission: Ct Head Wo Contrast  03/14/2014   CLINICAL DATA:  Fever.  Altered mental status.  EXAM: CT HEAD WITHOUT CONTRAST  TECHNIQUE: Contiguous axial images were obtained from the base of the skull through the vertex without intravenous contrast.  COMPARISON:  None.  FINDINGS: No mass lesion. No midline shift. No acute hemorrhage or hematoma. No extra-axial fluid collections. No evidence of acute infarction. There is diffuse cerebral cortical and cerebellar atrophy. Minimal periventricular white matter lucency consistent with slight chronic small vessel ischemic disease. No significant osseous abnormalities. Tiny retention cysts in both maxillary sinuses.  There is slight midline shift which is not felt to be significant. There are no abnormal extra-axial fluid collections.  IMPRESSION: No acute abnormalities. Atrophy. Slight chronic small vessel ischemic disease.   Electronically Signed   By: Rozetta Nunnery M.D.   On: 03/14/2014 20:50   Dg Chest Port 1 View  03/14/2014   CLINICAL DATA:  80 year old male with fever and cough.  EXAM: PORTABLE CHEST - 1 VIEW  COMPARISON:  01/06/2010  FINDINGS: Mild cardiomegaly and left-sided pacemaker again noted.  There is no evidence of focal airspace disease, pulmonary edema, suspicious pulmonary nodule/mass, pleural effusion, or pneumothorax. No acute bony abnormalities are identified.  IMPRESSION: No active disease.   Electronically Signed   By: Hassan Rowan M.D.   On: 03/14/2014 19:31    Assessment/Plan  80 yo male with fever and delerium  Principal Problem:   Delirium, acute-  Fever source is unclear.  Has been on levaquin for 2 days and sounds like that urine cx did not grow anything specific.  bc done.  Broaden abx to vanc and zosyn.  cxr does not show infiltrate but pt has been coughing a lot so could be early pna.  Vss.  Active Problems:   Essential hypertension, benign   Atrial  fibrillation   Pacemaker-St.Jude   Anemia of chronic disease   Fever  DNR paperwork sent from snf also.  Admit to tele.  Zaydn Gutridge A Shanon Brow 03/14/2014, 10:49 PM

## 2014-03-14 NOTE — ED Provider Notes (Signed)
CSN: [id number]     Arrival date & time 03/14/14  1631 History   First MD Initiated Contact with Patient 03/14/14 1648     Chief Complaint  Patient presents with  . Fever  . Urine Output    decreased     (Consider location/radiation/quality/duration/timing/severity/associated sxs/prior Treatment) HPI Comments: 80 year old male with an extensive past medical history presents to the emergency department via EMS from [place] home with her son and daughter with a fever, decreased urine output and confusion x3 days. According to the nursing home, patient has had intermittent fevers, last checked prior to arrival 100.3 tympanic, Tylenol was given prior to transport. Daughter and son state patient has had decreased urine output and rapid breathing with a cough beginning today. It is noted O2 sat 92% on room air, patient does not require oxygen at the nursing home. The nursing home checked urine 2 days ago which only showed a small amount of bacteria, a repeat urine was checked yesterday, however this was contaminated. He also had a normal chest x-ray yesterday. CBC was obtained yesterday with a white count of 17,000. He was started on levaquin. Nursing facility also mentioned lower extremity edema, however daughter and son state they have not noticed this. Pt himself if denying any symptoms at this time. Level V caveat due to AMS.  Patient is a 80 y.o. male presenting with fever. The history is provided by a relative and the EMS personnel.  Fever Associated symptoms: confusion and cough     Past Medical History  Diagnosis Date  . Hypertension   . Prostate cancer   . Spinal stenosis   . Peripheral neuropathy   . Diverticulosis   . Complete heart block     s/p PPM  . Vitamin D deficiency   . Pacemaker     PPM- St Jude  . Paroxysmal atrial fibrillation   . Contusion of face, scalp, and neck except eye(s) 04/23/2012  . Abnormal weight gain 04/12/2012  . Fall from other slipping,  tripping, or stumbling 04/08/2012  . Congestive heart failure, unspecified 03/05/2012  . Shortness of breath 03/05/2012  . Unspecified hypertrophic and atrophic condition of skin 01/16/2012  . Sebaceous cyst 01/16/2012  . Unspecified constipation 11/28/2011  . Edema 04/12/2010  . Other specified cardiac dysrhythmias(427.89) 01/03/2010  . Other premature beats 06/02/2008  . Other malaise and fatigue 01/28/2008  . Nonspecific abnormal electrocardiogram (ECG) (EKG) 01/28/2008  . Internal hemorrhoids without mention of complication 2/59/5638  . Unspecified hearing loss 02/19/2007  . Osteoarthrosis, unspecified whether generalized or localized, unspecified site 02/19/2007  . Left bundle branch hemiblock 06/04/1999  . Abnormality of gait 06/04/1999  . Aortic valve disorders 06/04/1979   Past Surgical History  Procedure Laterality Date  . Pacemaker insertion      PPM- St Jude-3/11  . Back surgery  2000    Spinal stenosis Dr. Achilles Dunk  . Nasal polyp excision    . Tonsillectomy    . Cataract extraction w/ intraocular lens  implant, bilateral  10/2009    Dr. Ellie Lunch  . Pacemaker placement  01/05/2010  . Colonoscopy  07/25/2007    int hem. diverticulosis  Dr. Lajoyce Corners   Family History  Problem Relation Age of Onset  . Coronary artery disease      family history  . Stroke Mother   . Heart disease Father   . Heart disease Brother   . Heart disease Brother   . Heart disease Brother   .  Lung disease Brother    History  Substance Use Topics  . Smoking status: Former Smoker -- 102 years    Quit date: 10/23/1937  . Smokeless tobacco: Never Used  . Alcohol Use: No    Review of Systems  Unable to perform ROS: Mental status change  Constitutional: Positive for fever.  Respiratory: Positive for cough and shortness of breath.   Genitourinary: Positive for decreased urine volume.  Psychiatric/Behavioral: Positive for confusion.      Allergies  Accupril; Altace; Fosinopril sodium; Hctz; Inderal; Mobic;  Monopril; Propranolol hcl; Quinapril hcl; and Reserpine  Home Medications   Prior to Admission medications   Medication Sig Start Date End Date Taking? Authorizing Provider  Calcium Carbonate-Vitamin D (CALTRATE 600+D) 600-400 MG-UNIT per tablet Take 1 tablet by mouth daily.    Historical Provider, MD  cetirizine (ZYRTEC ALLERGY) 10 MG tablet Take 10 mg by mouth daily. To help with itching    Historical Provider, MD  Cholecalciferol (VITAMIN D) 1000 UNITS capsule Take 2 capsules daily    Historical Provider, MD  docusate (COLACE) 50 MG/5ML liquid Instill 3-5 drops into each ear at bedtime on Saturday & Sunday twice a month.    Historical Provider, MD  doxazosin (CARDURA) 2 MG tablet Take 2 mg by mouth daily.    Historical Provider, MD  Glucosamine 500 MG CAPS Take 3 capsules by mouth daily.     Historical Provider, MD  levofloxacin (LEVAQUIN) 500 MG tablet  03/13/14   Historical Provider, MD  losartan (COZAAR) 50 MG tablet Take 50 mg by mouth daily. For Hypertention    Historical Provider, MD  Rivaroxaban (XARELTO) 15 MG TABS tablet Take 1 tablet (15 mg total) by mouth daily. 11/20/11   Thompson Grayer, MD  triamcinolone cream (KENALOG) 0.1 %  12/16/13   Historical Provider, MD   Pulse 76  Temp(Src) 99 F (37.2 C) (Axillary)  Resp 19  Ht 5\' 6"  (1.676 m)  Wt 206 lb (93.441 kg)  BMI 33.27 kg/m2  SpO2 97% Physical Exam  Nursing note and vitals reviewed. Constitutional: He appears well-developed and well-nourished. No distress.  HENT:  Head: Normocephalic and atraumatic.  Mouth/Throat: Oropharynx is clear and moist.  Eyes: Conjunctivae are normal.  Neck: Normal range of motion. Neck supple.  Cardiovascular: Normal rate.  An irregularly irregular rhythm present.  Murmur heard.  Systolic murmur is present with a grade of 3/6  Pulmonary/Chest: Tachypnea noted. No respiratory distress. He has no decreased breath sounds. He has no wheezes. He has no rhonchi. He has no rales.  Labored breathing.   Abdominal: Soft. Bowel sounds are normal. He exhibits no distension. There is no tenderness.  Musculoskeletal: Normal range of motion. He exhibits no edema.  Neurological: He is alert.  Oriented to person and place, not time.  Skin: Skin is warm and dry. He is not diaphoretic.  Psychiatric: He has a normal mood and affect. His behavior is normal.    ED Course  Procedures (including critical care time) Labs Review Labs Reviewed  CBC WITH DIFFERENTIAL - Abnormal; Notable for the following:    WBC 13.3 (*)    RBC 3.26 (*)    Hemoglobin 9.8 (*)    HCT 30.5 (*)    Neutrophils Relative % 83 (*)    Neutro Abs 11.2 (*)    Lymphocytes Relative 6 (*)    Monocytes Absolute 1.2 (*)    All other components within normal limits  COMPREHENSIVE METABOLIC PANEL - Abnormal; Notable for the following:  BUN 28 (*)    Albumin 2.3 (*)    GFR calc non Af Amer 67 (*)    GFR calc Af Amer 78 (*)    All other components within normal limits  URINALYSIS, ROUTINE W REFLEX MICROSCOPIC - Abnormal; Notable for the following:    APPearance CLOUDY (*)    Hgb urine dipstick LARGE (*)    Leukocytes, UA SMALL (*)    All other components within normal limits  CULTURE, BLOOD (ROUTINE X 2)  CULTURE, BLOOD (ROUTINE X 2)  URINE CULTURE  URINE MICROSCOPIC-ADD ON  I-STAT CG4 LACTIC ACID, ED    Imaging Review Ct Head Wo Contrast  03/14/2014   CLINICAL DATA:  Fever.  Altered mental status.  EXAM: CT HEAD WITHOUT CONTRAST  TECHNIQUE: Contiguous axial images were obtained from the base of the skull through the vertex without intravenous contrast.  COMPARISON:  None.  FINDINGS: No mass lesion. No midline shift. No acute hemorrhage or hematoma. No extra-axial fluid collections. No evidence of acute infarction. There is diffuse cerebral cortical and cerebellar atrophy. Minimal periventricular white matter lucency consistent with slight chronic small vessel ischemic disease. No significant osseous abnormalities. Tiny  retention cysts in both maxillary sinuses.  There is slight midline shift which is not felt to be significant. There are no abnormal extra-axial fluid collections.  IMPRESSION: No acute abnormalities. Atrophy. Slight chronic small vessel ischemic disease.   Electronically Signed   By: Rozetta Nunnery M.D.   On: 03/14/2014 20:50   Dg Chest Port 1 View  03/14/2014   CLINICAL DATA:  80 year old male with fever and cough.  EXAM: PORTABLE CHEST - 1 VIEW  COMPARISON:  01/06/2010  FINDINGS: Mild cardiomegaly and left-sided pacemaker again noted.  There is no evidence of focal airspace disease, pulmonary edema, suspicious pulmonary nodule/mass, pleural effusion, or pneumothorax. No acute bony abnormalities are identified.  IMPRESSION: No active disease.   Electronically Signed   By: Hassan Rowan M.D.   On: 03/14/2014 19:31     EKG Interpretation None      MDM   Final diagnoses:  Altered mental status  UTI (urinary tract infection)   Patient presenting with altered mental status, fever or, decreased urine output and rapid breathing. He is nontoxic appearing and in no apparent distress. Temp 99, will check rectal temp, pt had tylenol PTA. Possible UTI and/or pneumonia. Labs, chest x-ray pending. Patient receiving IV fluids. Probable sepsis. 7:49 PM Leukocytosis of 13.3. Urinalysis with small leukocytes, 3-6 white blood cells, nitrite negative. No significant urinary tract infection. Urine culture pending. Chest x-ray clear. Will obtain head CT for further evaluation of altered mental status. Son states mental status is starting to improve after receiving fluids, however daughter is uncertain. 10:26 PM Son states pt seems to be more agitated at this time and becoming more altered. Head CT normal. Will admit for AMS and UTI. IV rocephin ordered. Admission accepted by Dr. Shanon Brow, [facility].  Case discussed with attending Dr. Tawnya Crook who also evaluated patient and agrees with plan of care.   Illene Labrador,  PA-C 03/15/14 [phone]

## 2014-03-14 NOTE — ED Notes (Addendum)
Patient transported by [facility] from [facility] with complaint of fever, decreased urine output, and some confusion x 3 days.  Patient had temperature of 100.3-tympanic and given tylenol prior to transport.  Patient also has lower extremity edema which EMS reported was new per nursing facility.

## 2014-03-14 NOTE — ED Notes (Signed)
Patient returned from CT

## 2014-03-14 NOTE — ED Notes (Signed)
Attempted to call report.  Nurse unavailable.  Advised they would call back shortly

## 2014-03-14 NOTE — Progress Notes (Signed)
Returned call for report at 2330, report obtained. Nicholas Hood

## 2014-03-14 NOTE — ED Notes (Signed)
Contact info for [employer] [phone] and [phone]

## 2014-03-15 ENCOUNTER — Encounter (HOSPITAL_COMMUNITY): Payer: Self-pay | Admitting: *Deleted

## 2014-03-15 DIAGNOSIS — I10 Essential (primary) hypertension: Secondary | ICD-10-CM

## 2014-03-15 DIAGNOSIS — A419 Sepsis, unspecified organism: Secondary | ICD-10-CM

## 2014-03-15 DIAGNOSIS — N390 Urinary tract infection, site not specified: Secondary | ICD-10-CM

## 2014-03-15 DIAGNOSIS — G9340 Encephalopathy, unspecified: Secondary | ICD-10-CM

## 2014-03-15 LAB — BASIC METABOLIC PANEL
BUN: 22 mg/dL (ref 6–23)
CO2: 21 meq/L (ref 19–32)
Calcium: 8.5 mg/dL (ref 8.4–10.5)
Chloride: 105 mEq/L (ref 96–112)
Creatinine, Ser: 0.92 mg/dL (ref 0.50–1.35)
GFR calc Af Amer: 80 mL/min — ABNORMAL LOW (ref >90)
GFR calc non Af Amer: 69 mL/min — ABNORMAL LOW (ref >90)
Glucose, Bld: 106 mg/dL — ABNORMAL HIGH (ref 70–99)
Potassium: 4.2 mEq/L (ref 3.7–5.3)
Sodium: 139 mEq/L (ref 137–147)

## 2014-03-15 LAB — CBC
HCT: 31.6 % — ABNORMAL LOW (ref 39.0–52.0)
Hemoglobin: 10.7 g/dL — ABNORMAL LOW (ref 13.0–17.0)
MCH: 31.4 pg (ref 26.0–34.0)
MCHC: 33.9 g/dL (ref 30.0–36.0)
MCV: 92.7 fL (ref 78.0–100.0)
PLATELETS: 212 10*3/uL (ref 150–400)
RBC: 3.41 MIL/uL — AB (ref 4.22–5.81)
RDW: 14.6 % (ref 11.5–15.5)
WBC: 12.4 10*3/uL — ABNORMAL HIGH (ref 4.0–10.5)

## 2014-03-15 LAB — MRSA PCR SCREENING: MRSA by PCR: NEGATIVE

## 2014-03-15 MED ORDER — LOSARTAN POTASSIUM 50 MG PO TABS
50.0000 mg | ORAL_TABLET | Freq: Every day | ORAL | Status: DC
  Administered 2014-03-15 – 2014-03-20 (×6): 50 mg via ORAL
  Filled 2014-03-15 (×7): qty 1

## 2014-03-15 MED ORDER — BIOTENE DRY MOUTH MT LIQD
15.0000 mL | Freq: Two times a day (BID) | OROMUCOSAL | Status: DC
  Administered 2014-03-15 – 2014-03-20 (×10): 15 mL via OROMUCOSAL

## 2014-03-15 MED ORDER — DOXAZOSIN MESYLATE 2 MG PO TABS
2.0000 mg | ORAL_TABLET | Freq: Every day | ORAL | Status: DC
  Administered 2014-03-15 – 2014-03-20 (×6): 2 mg via ORAL
  Filled 2014-03-15 (×8): qty 1

## 2014-03-15 MED ORDER — SODIUM CHLORIDE 0.9 % IV SOLN
INTRAVENOUS | Status: DC
  Administered 2014-03-15: 01:00:00 via INTRAVENOUS

## 2014-03-15 MED ORDER — ALBUTEROL SULFATE (2.5 MG/3ML) 0.083% IN NEBU
2.5000 mg | INHALATION_SOLUTION | RESPIRATORY_TRACT | Status: DC | PRN
  Administered 2014-03-15: 2.5 mg via RESPIRATORY_TRACT
  Filled 2014-03-15: qty 3

## 2014-03-15 MED ORDER — RIVAROXABAN 15 MG PO TABS
15.0000 mg | ORAL_TABLET | Freq: Every day | ORAL | Status: DC
  Administered 2014-03-15 – 2014-03-19 (×5): 15 mg via ORAL
  Filled 2014-03-15 (×6): qty 1

## 2014-03-15 MED ORDER — VANCOMYCIN HCL 10 G IV SOLR
1500.0000 mg | INTRAVENOUS | Status: DC
  Filled 2014-03-15: qty 1500

## 2014-03-15 MED ORDER — VITAMIN D3 25 MCG (1000 UNIT) PO TABS
2000.0000 [IU] | ORAL_TABLET | Freq: Every day | ORAL | Status: DC
  Administered 2014-03-15 – 2014-03-20 (×6): 2000 [IU] via ORAL
  Filled 2014-03-15 (×7): qty 2

## 2014-03-15 MED ORDER — PIPERACILLIN-TAZOBACTAM 3.375 G IVPB
3.3750 g | Freq: Three times a day (TID) | INTRAVENOUS | Status: DC
  Administered 2014-03-15 – 2014-03-19 (×14): 3.375 g via INTRAVENOUS
  Filled 2014-03-15 (×17): qty 50

## 2014-03-15 MED ORDER — ACETAMINOPHEN 325 MG PO TABS
650.0000 mg | ORAL_TABLET | ORAL | Status: DC | PRN
  Administered 2014-03-18: 650 mg via ORAL
  Filled 2014-03-15: qty 2

## 2014-03-15 NOTE — Progress Notes (Signed)
ANTIBIOTIC CONSULT NOTE - INITIAL  Pharmacy Consult for Vancomycin/Zosyn Indication: Bacteremia  Allergies  Allergen Reactions  . Accupril [Quinapril Hcl]     Per MAR  . Alacepril     Per MAR  . Altace [Ramipril]     Unknown reaction   . Hctz [Hydrochlorothiazide]     Unknown reaction  . Inderal [Propranolol]     Per MAR  . Mobic [Meloxicam]     Per MAR  . Monopril [Fosinopril]     Per MAR  . Quinapril Hcl     Unknown reaction  . Reserpine     Per [facility]   Patient Measurements: Height: 5\' 11"  (180.3 cm) Weight: 215 lb 12.8 oz (97.886 kg) IBW/kg (Calculated) : 75.3  Vital Signs: Temp: 98.3 F (36.8 C) (05/24 0038) Temp src: Oral (05/24 0038) BP: 136/64 mmHg (05/24 0038) Pulse Rate: 79 (05/24 0038) Intake/Output from previous day: 05/23 0701 - 05/24 0700 In: -  Out: 125 [Urine:125] Intake/Output from this shift: Total I/O In: -  Out: 75 [Urine:75]  Labs:  Recent Labs  03/13/14 03/14/14 1800  WBC 17.2 13.3*  HGB 11.6* 9.8*  PLT 208 207  CREATININE 1.0 0.99   Estimated Creatinine Clearance: 52 ml/min (by C-G formula based on Cr of 0.99).  Medical History: Past Medical History  Diagnosis Date  . Hypertension   . Prostate cancer   . Spinal stenosis   . Peripheral neuropathy   . Diverticulosis   . Complete heart block     s/p PPM  . Vitamin D deficiency   . Pacemaker     PPM- St Jude  . Paroxysmal atrial fibrillation   . Contusion of face, scalp, and neck except eye(s) 04/23/2012  . Abnormal weight gain 04/12/2012  . Fall from other slipping, tripping, or stumbling 04/08/2012  . Congestive heart failure, unspecified 03/05/2012  . Shortness of breath 03/05/2012  . Unspecified hypertrophic and atrophic condition of skin 01/16/2012  . Sebaceous cyst 01/16/2012  . Unspecified constipation 11/28/2011  . Edema 04/12/2010  . Other specified cardiac dysrhythmias(427.89) 01/03/2010  . Other premature beats 06/02/2008  . Other malaise and fatigue 01/28/2008  .  Nonspecific abnormal electrocardiogram (ECG) (EKG) 01/28/2008  . Internal hemorrhoids without mention of complication 8/34/1962  . Unspecified hearing loss 02/19/2007  . Osteoarthrosis, unspecified whether generalized or localized, unspecified site 02/19/2007  . Left bundle branch hemiblock 06/04/1999  . Abnormality of gait 06/04/1999  . Aortic valve disorders 06/04/1979   Medications:  Anti-infectives   Start     Dose/Rate Route Frequency Ordered Stop   03/14/14 2300  vancomycin (VANCOCIN) IVPB 1000 mg/200 mL premix  Status:  Discontinued     1,000 mg 200 mL/hr over 60 Minutes Intravenous  Once 03/14/14 2248 03/15/14 0020   03/14/14 2300  piperacillin-tazobactam (ZOSYN) IVPB 3.375 g     3.375 g 100 mL/hr over 30 Minutes Intravenous  Once 03/14/14 2248 03/14/14 2344   03/14/14 2230  cefTRIAXone (ROCEPHIN) 1 g in dextrose 5 % 50 mL IVPB  Status:  Discontinued     1 g 100 mL/hr over 30 Minutes Intravenous  Once 03/14/14 2223 03/14/14 2248     Assessment: 80yo male admitted with fever spike to 102 and confusion.  He was recently treated for UTI with Levaquin x 48 hours but urine culture was listed as contaminate.  We have been asked to broaden his IV antibiotics to Vancomycin and Zosyn.  He has received one dose of Vancomycin 1gm and 3.375 gm IV  Zosyn around 23:00PM 5/23.  He also received one dose of Ceftriaxone 1gm around 22:30PM.  He has a normal creatinine of 0.99 and an estimated crcl of ~ 67ml/min.  He has some leukocytosis which has improved from 5/22.  Goal of Therapy:  Vancomycin trough level 15-20 mcg/ml  Plan:  1.  Will begin a maintenance dose of IV Vancomycin with 1500mg  every 24 hours 2.  Continue Zosyn 3.375 gm IV every 8 hours 3.  Monitor culture data and de-escalate as possible 4.  Monitor renal function   Rober Minion, PharmD., MS Clinical Pharmacist Pager:  [phone] Thank you for allowing pharmacy to be part of this patients care team. 03/15/2014,12:46 AM

## 2014-03-15 NOTE — Progress Notes (Signed)
[facility] PROGRESS NOTE  Nicholas Hood GGY:[id number] DOB: [birth year] DOA: 03/14/2014 PCP: Estill Dooms, MD  Assessment/Plan: 1. Sepsis  -Present on admission, evidenced by white count of 17,200, temperature 102 per nurse report, respiratory of 34 -Source of infection likely to be urinary tract. Chest x-ray did not reveal acute cardiopulmonary changes -Blood cultures negative thus far -Continue supportive care  2.  Suspected urinary tract infection -Patient apparently diagnosed with UTI at a skilled nursing facility and started on Levaquin -Continue empiric antibiotic therapy with Zosyn -Followup on cultures  3.  Cough -Patient reporting cough at his skilled nursing facility although chest x-ray was negative. -Plan to repeat chest x-ray in a.m. Meanwhile will discontinue vancomycin  4. Metabolic encephalopathy -Likely secondary to underlying infectious process -Started on empiric IV antibiotic therapy -Sees to be improving today -Patient was held out of bed to chair  5. Hypertension -Continue losartan 50 mg by mouth daily. Blood pressures are controlled  Code Status: DO NOT RESUSCITATE Family Communication: Spoke with patient's son present at bedside Disposition Plan: Continue supportive care, IV antibiotics, anticipate discharge back to skilled nursing facility when stable   Antibiotics:  Zosyn IV  HPI/Subjective: Patient is a pleasant 80 year old gentleman with a past medical history of prostate cancer, hypertension, peripheral neuropathy, who presented as a transfer from his skilled nursing facility overnight after having a steep functional decline and spiked a temperature of 102. Nursing home reporting possible urinary tract infection as he was started on Levaquin. He was given IV fluids overnight and started on vancomycin and Zosyn. Son present at bedside reports that he seems to be getting better and today. He was assisted out of bed to  chair.  Objective: Filed Vitals:   03/15/14 1301  BP: 135/87  Pulse: 84  Temp: 97.7 F (36.5 C)  Resp: 20    Intake/Output Summary (Last 24 hours) at 03/15/14 1624 Last data filed at 03/15/14 1555  Gross per 24 hour  Intake    580 ml  Output   1075 ml  Net   -495 ml   Filed Weights   03/14/14 1648 03/15/14 0038  Weight: 93.441 kg (206 lb) 97.886 kg (215 lb 12.8 oz)    Exam:   General:  Patient seems improved, awake, alert, was assisted out of bed to chair  Cardiovascular: Regular rate rhythm normal S1-S2  Respiratory: Coarse respiratory sounds, normal respiratory effort  Abdomen: Soft nontender nondistended  Musculoskeletal: Trace edema to lower extremities   Data Reviewed: Basic Metabolic Panel:  Recent Labs Lab 03/13/14 03/14/14 1800 03/15/14 0705  NA 138 138 139  K 4.3 4.2 4.2  CL  --  105 105  CO2  --  22 21  GLUCOSE  --  98 106*  BUN 29* 28* 22  CREATININE 1.0 0.99 0.92  CALCIUM  --  8.5 8.5   Liver Function Tests:  Recent Labs Lab 03/13/14 03/14/14 1800  AST 32 21  ALT 18 17  ALKPHOS 49 59  BILITOT  --  0.3  PROT  --  7.1  ALBUMIN  --  2.3*   No results found for this basename: LIPASE, AMYLASE,  in the last 168 hours No results found for this basename: AMMONIA,  in the last 168 hours CBC:  Recent Labs Lab 03/13/14 03/14/14 1800 03/15/14 0705  WBC 17.2 13.3* 12.4*  NEUTROABS  --  11.2*  --   HGB 11.6* 9.8* 10.7*  HCT 33* 30.5* 31.6*  MCV  --  93.6  92.7  PLT 208 207 212   Cardiac Enzymes: No results found for this basename: CKTOTAL, CKMB, CKMBINDEX, TROPONINI,  in the last 168 hours BNP (last 3 results) No results found for this basename: PROBNP,  in the last 8760 hours CBG: No results found for this basename: GLUCAP,  in the last 168 hours  Recent Results (from the past 240 hour(s))  MRSA PCR SCREENING     Status: None   Collection Time    03/15/14 12:22 AM      Result Value Ref Range Status   MRSA by PCR NEGATIVE   NEGATIVE Final   Comment:            The GeneXpert MRSA Assay (FDA     approved for NASAL specimens     only), is one component of a     comprehensive MRSA colonization     surveillance program. It is not     intended to diagnose MRSA     infection nor to guide or     monitor treatment for     MRSA infections.     Studies: Ct Head Wo Contrast  03/14/2014   CLINICAL DATA:  Fever.  Altered mental status.  EXAM: CT HEAD WITHOUT CONTRAST  TECHNIQUE: Contiguous axial images were obtained from the base of the skull through the vertex without intravenous contrast.  COMPARISON:  None.  FINDINGS: No mass lesion. No midline shift. No acute hemorrhage or hematoma. No extra-axial fluid collections. No evidence of acute infarction. There is diffuse cerebral cortical and cerebellar atrophy. Minimal periventricular white matter lucency consistent with slight chronic small vessel ischemic disease. No significant osseous abnormalities. Tiny retention cysts in both maxillary sinuses.  There is slight midline shift which is not felt to be significant. There are no abnormal extra-axial fluid collections.  IMPRESSION: No acute abnormalities. Atrophy. Slight chronic small vessel ischemic disease.   Electronically Signed   By: Rozetta Nunnery M.D.   On: 03/14/2014 20:50   Dg Chest Port 1 View  03/14/2014   CLINICAL DATA:  80 year old male with fever and cough.  EXAM: PORTABLE CHEST - 1 VIEW  COMPARISON:  01/06/2010  FINDINGS: Mild cardiomegaly and left-sided pacemaker again noted.  There is no evidence of focal airspace disease, pulmonary edema, suspicious pulmonary nodule/mass, pleural effusion, or pneumothorax. No acute bony abnormalities are identified.  IMPRESSION: No active disease.   Electronically Signed   By: Hassan Rowan M.D.   On: 03/14/2014 19:31    Scheduled Meds: . antiseptic oral rinse  15 mL Mouth Rinse BID  . cholecalciferol  2,000 Units Oral Daily  . doxazosin  2 mg Oral Daily  . losartan  50 mg Oral  Daily  . piperacillin-tazobactam (ZOSYN)  IV  3.375 g Intravenous 3 times per day  . Rivaroxaban  15 mg Oral QPC supper   Continuous Infusions:   Principal Problem:   Delirium, acute Active Problems:   Essential hypertension, benign   Atrial fibrillation   Pacemaker-St.Jude   Anemia of chronic disease   Altered mental status   Fever    Time spent: 35 min    [facility] Pager [phone]. If 7PM-7AM, please contact night-coverage at [email or web address], password [facility] 03/15/2014, 4:24 PM  LOS: 1 day

## 2014-03-15 NOTE — Progress Notes (Signed)
NURSING PROGRESS NOTE  Nicholas Hood [id number] Admission Data: 03/15/2014 0030 Attending Provider: Phillips Grout, MD [id number], [name], MD Code Status: DNR   Nicholas Hood is a 80 y.o. male patient admitted from ED:  -No acute distress noted.  -No complaints of shortness of breath.  -No complaints of chest pain.   Cardiac Monitoring: Box # 15 in place. Cardiac monitor yields:paced.  Blood pressure 136/64, pulse 79, temperature 98.3 F (36.8 C), temperature source Oral, resp. rate 23, height 5\' 11"  (1.803 m), weight 97.886 kg (215 lb 12.8 oz), SpO2 95.00%.   IV Fluids:  IV in place, occlusive dsg intact without redness, IV cath antecubital left, condition patent and no redness normal saline.   Allergies:  Accupril; Alacepril; Altace; Hctz; Inderal; Mobic; Monopril; Quinapril hcl; and Reserpine  Past Medical History:   has a past medical history of Hypertension; Prostate cancer; Spinal stenosis; Peripheral neuropathy; Diverticulosis; Complete heart block; Vitamin D deficiency; Pacemaker; Paroxysmal atrial fibrillation; Contusion of face, scalp, and neck except eye(s) (04/23/2012); Abnormal weight gain (04/12/2012); Fall from other slipping, tripping, or stumbling (04/08/2012); Congestive heart failure, unspecified (03/05/2012); Shortness of breath (03/05/2012); Unspecified hypertrophic and atrophic condition of skin (01/16/2012); Sebaceous cyst (01/16/2012); Unspecified constipation (11/28/2011); Edema (04/12/2010); Other specified cardiac dysrhythmias(427.89) (01/03/2010); Other premature beats (06/02/2008); Other malaise and fatigue (01/28/2008); Nonspecific abnormal electrocardiogram (ECG) (EKG) (01/28/2008); Internal hemorrhoids without mention of complication (5/46/5035); Unspecified hearing loss (02/19/2007); Osteoarthrosis, unspecified whether generalized or localized, unspecified site (02/19/2007); Left bundle branch hemiblock (06/04/1999); Abnormality of gait (06/04/1999); and Aortic valve disorders  (06/04/1979).  Past Surgical History:   has past surgical history that includes Pacemaker insertion; Back surgery (2000); Nasal polyp excision; Tonsillectomy; Cataract extraction w/ intraocular lens  implant, bilateral (10/2009); pacemaker placement (01/05/2010); and Colonoscopy (07/25/2007).  Social History:   reports that he quit smoking about 76 years ago. He has never used smokeless tobacco. He reports that he does not drink alcohol or use illicit drugs.  Skin: Partial thickness wound to right groin, depends removed. Nonblanchable redness to coccyx and to right heel. Barrier film applied to coccyx and right groin. Heels elevated with a pillow, turn and position schedule initiated.   Patient/Family orientated to room. Information packet given to patient/family. Admission inpatient armband information verified with patient/family to include name and date of birth and placed on patient arm. Side rails up x 2, fall assessment and education completed with patient/family. Patient/family able to verbalize understanding of risk associated with falls and verbalized understanding to call for assistance before getting out of bed. Call light within reach and educated on how to use. Patient/family able to voice and demonstrate understanding of unit orientation instructions.

## 2014-03-15 NOTE — ED Provider Notes (Signed)
Medical screening examination/treatment/procedure(s) were conducted as a shared visit with non-physician practitioner(s) and myself.  I personally evaluated the patient during the encounter. Pt presenting from SNF with fever and AMS, has been started on Levaquin w/o clear cause 2 days ago. Pt HDS on PE, urine possible source, though given he has been on abx, this is not clear. IV rocephin will be given, pt admitted to [facility].     EKG Interpretation   Date/Time:  Saturday Mar 14 2014 22:43:15 EDT Ventricular Rate:  94 PR Interval:    QRS Duration: 156 QT Interval:  422 QTC Calculation: 528 R Axis:   -32 Text Interpretation:  Afib/flut and V-paced complexes No further analysis  attempted due to paced rhythm Confirmed by Rolette  MD, [place] ([id number]) on  03/14/2014 10:46:55 PM        Neta Ehlers, MD 03/15/14 1046

## 2014-03-16 ENCOUNTER — Inpatient Hospital Stay (HOSPITAL_COMMUNITY): Payer: Medicare Other

## 2014-03-16 DIAGNOSIS — D72829 Elevated white blood cell count, unspecified: Secondary | ICD-10-CM

## 2014-03-16 LAB — CBC
HEMATOCRIT: 31.7 % — AB (ref 39.0–52.0)
HEMOGLOBIN: 10.6 g/dL — AB (ref 13.0–17.0)
MCH: 31.3 pg (ref 26.0–34.0)
MCHC: 33.4 g/dL (ref 30.0–36.0)
MCV: 93.5 fL (ref 78.0–100.0)
Platelets: 248 10*3/uL (ref 150–400)
RBC: 3.39 MIL/uL — AB (ref 4.22–5.81)
RDW: 14.7 % (ref 11.5–15.5)
WBC: 11.1 10*3/uL — ABNORMAL HIGH (ref 4.0–10.5)

## 2014-03-16 LAB — BASIC METABOLIC PANEL
BUN: 22 mg/dL (ref 6–23)
CHLORIDE: 107 meq/L (ref 96–112)
CO2: 22 mEq/L (ref 19–32)
Calcium: 8.5 mg/dL (ref 8.4–10.5)
Creatinine, Ser: 0.98 mg/dL (ref 0.50–1.35)
GFR calc non Af Amer: 67 mL/min — ABNORMAL LOW (ref >90)
GFR, EST AFRICAN AMERICAN: 78 mL/min — AB (ref >90)
Glucose, Bld: 105 mg/dL — ABNORMAL HIGH (ref 70–99)
POTASSIUM: 4.2 meq/L (ref 3.7–5.3)
Sodium: 140 mEq/L (ref 137–147)

## 2014-03-16 LAB — URINE CULTURE
Colony Count: NO GROWTH
Culture: NO GROWTH

## 2014-03-16 MED ORDER — DOCUSATE SODIUM 100 MG PO CAPS
100.0000 mg | ORAL_CAPSULE | Freq: Two times a day (BID) | ORAL | Status: DC
  Administered 2014-03-16 – 2014-03-20 (×8): 100 mg via ORAL
  Filled 2014-03-16 (×11): qty 1

## 2014-03-16 MED ORDER — BENZONATATE 100 MG PO CAPS
200.0000 mg | ORAL_CAPSULE | Freq: Three times a day (TID) | ORAL | Status: DC
  Administered 2014-03-16 – 2014-03-20 (×13): 200 mg via ORAL
  Filled 2014-03-16 (×15): qty 2

## 2014-03-16 NOTE — Progress Notes (Signed)
[facility] PROGRESS NOTE  Nicholas Hood GEX:[id number] DOB: 1915/11/01 DOA: 03/14/2014 PCP: Estill Dooms, MD  Assessment/Plan: 1. Sepsis  -Present on admission, evidenced by white count of 17,200, temperature 102 per nurse report, respiratory of 34, positive blood culture -Source of infection likely to be urinary tract. Chest x-ray did not reveal acute cardiopulmonary changes -Blood cultures now growing a gram negative rod in 1/2 cultures  -Continue Zosyn  2.  Likely urinary tract infection -Patient apparently diagnosed with UTI at a skilled nursing facility and started on Levaquin -Continue empiric antibiotic therapy with Zosyn -Blood culture growing gram negative rod  3. Bacteremia -Lab reporting gram negative rods growing out of 1/2 cultures, will follow susceptibility testing -Will repeat blood cultures today, meanwhile continue Zosyn.   4.  Cough -Patient reporting cough at his skilled nursing facility although chest x-ray was negative. -CXR did not show infiltrate -Could be due to viral PNA or aspiration -Will consult speech  5. Metabolic encephalopathy -Likely secondary to underlying infectious process -Started on empiric IV antibiotic therapy -Sees to be improving today -Patient was helped out of bed to chair  6. Hypertension -Continue losartan 50 mg by mouth daily. Blood pressures are controlled  Code Status: DO NOT RESUSCITATE Family Communication: Spoke with patient's son present at bedside Disposition Plan: Continue supportive care, IV antibiotics, anticipate discharge back to skilled nursing facility when stable   Antibiotics:  Zosyn IV  HPI/Subjective: Patient is a pleasant 80 year old gentleman with a past medical history of prostate cancer, hypertension, peripheral neuropathy, who presented as a transfer from his skilled nursing facility overnight after having a steep functional decline and spiked a temperature of 102. Nursing home reporting  possible urinary tract infection as he was started on Levaquin. He was given IV fluids overnight and started on vancomycin and Zosyn. Son present at bedside reports that he seems to be getting better and today. He was assisted out of bed to chair.  Objective: Filed Vitals:   03/16/14 0412  BP: 102/67  Pulse: 75  Temp: 98.5 F (36.9 C)  Resp: 20    Intake/Output Summary (Last 24 hours) at 03/16/14 1039 Last data filed at 03/16/14 0920  Gross per 24 hour  Intake    580 ml  Output    600 ml  Net    -20 ml   Filed Weights   03/14/14 1648 03/15/14 0038 03/16/14 0412  Weight: 93.441 kg (206 lb) 97.886 kg (215 lb 12.8 oz) 96.979 kg (213 lb 12.8 oz)    Exam:   General:  Patient seems improved, awake, alert, was assisted out of bed to chair  Cardiovascular: Regular rate rhythm normal S1-S2  Respiratory: Coarse respiratory sounds, normal respiratory effort  Abdomen: Soft nontender nondistended  Musculoskeletal: Trace edema to lower extremities   Data Reviewed: Basic Metabolic Panel:  Recent Labs Lab 03/13/14 03/14/14 1800 03/15/14 0705 03/16/14 0441  NA 138 138 139 140  K 4.3 4.2 4.2 4.2  CL  --  105 105 107  CO2  --  22 21 22   GLUCOSE  --  98 106* 105*  BUN 29* 28* 22 22  CREATININE 1.0 0.99 0.92 0.98  CALCIUM  --  8.5 8.5 8.5   Liver Function Tests:  Recent Labs Lab 03/13/14 03/14/14 1800  AST 32 21  ALT 18 17  ALKPHOS 49 59  BILITOT  --  0.3  PROT  --  7.1  ALBUMIN  --  2.3*   No results found for  this basename: LIPASE, AMYLASE,  in the last 168 hours No results found for this basename: AMMONIA,  in the last 168 hours CBC:  Recent Labs Lab 03/13/14 03/14/14 1800 03/15/14 0705 03/16/14 0441  WBC 17.2 13.3* 12.4* 11.1*  NEUTROABS  --  11.2*  --   --   HGB 11.6* 9.8* 10.7* 10.6*  HCT 33* 30.5* 31.6* 31.7*  MCV  --  93.6 92.7 93.5  PLT 208 207 212 248   Cardiac Enzymes: No results found for this basename: CKTOTAL, CKMB, CKMBINDEX, TROPONINI,  in  the last 168 hours BNP (last 3 results) No results found for this basename: PROBNP,  in the last 8760 hours CBG: No results found for this basename: GLUCAP,  in the last 168 hours  Recent Results (from the past 240 hour(s))  URINE CULTURE     Status: None   Collection Time    03/14/14  5:31 PM      Result Value Ref Range Status   Specimen Description URINE, CATHETERIZED   Final   Special Requests NONE   Final   Culture  Setup Time     Final   Value: 03/15/2014 03:47     Performed at [facility]     Final   Value: NO GROWTH     Performed at [facility]   Culture     Final   Value: NO GROWTH     Performed at [facility]   Report Status 03/16/2014 FINAL   Final  CULTURE, BLOOD (ROUTINE X 2)     Status: None   Collection Time    03/14/14  6:00 PM      Result Value Ref Range Status   Specimen Description BLOOD RIGHT ARM   Final   Special Requests BOTTLES DRAWN AEROBIC AND ANAEROBIC 5CC   Final   Culture  Setup Time     Final   Value: 03/15/2014 03:14     Performed at [facility]   Culture     Final   Value: GRAM NEGATIVE RODS     Note: Gram Stain Report Called to,Read Back By and Verified With: KATIE B BY INGRAM A 03/16/14 1025AM     Performed at [facility]   Report Status PENDING   Incomplete  CULTURE, BLOOD (ROUTINE X 2)     Status: None   Collection Time    03/14/14  6:20 PM      Result Value Ref Range Status   Specimen Description BLOOD RIGHT ARM   Final   Special Requests BOTTLES DRAWN AEROBIC AND ANAEROBIC 10CC   Final   Culture  Setup Time     Final   Value: 03/15/2014 03:14     Performed at [facility]   Culture     Final   Value:        BLOOD CULTURE RECEIVED NO GROWTH TO DATE CULTURE WILL BE HELD FOR 5 DAYS BEFORE ISSUING A FINAL NEGATIVE REPORT     Performed at [facility]   Report Status PENDING   Incomplete  MRSA PCR SCREENING     Status: None   Collection Time    03/15/14 12:22  AM      Result Value Ref Range Status   MRSA by PCR NEGATIVE  NEGATIVE Final   Comment:            The GeneXpert MRSA Assay (FDA     approved for  NASAL specimens     only), is one component of a     comprehensive MRSA colonization     surveillance program. It is not     intended to diagnose MRSA     infection nor to guide or     monitor treatment for     MRSA infections.     Studies: Ct Head Wo Contrast  03/14/2014   CLINICAL DATA:  Fever.  Altered mental status.  EXAM: CT HEAD WITHOUT CONTRAST  TECHNIQUE: Contiguous axial images were obtained from the base of the skull through the vertex without intravenous contrast.  COMPARISON:  None.  FINDINGS: No mass lesion. No midline shift. No acute hemorrhage or hematoma. No extra-axial fluid collections. No evidence of acute infarction. There is diffuse cerebral cortical and cerebellar atrophy. Minimal periventricular white matter lucency consistent with slight chronic small vessel ischemic disease. No significant osseous abnormalities. Tiny retention cysts in both maxillary sinuses.  There is slight midline shift which is not felt to be significant. There are no abnormal extra-axial fluid collections.  IMPRESSION: No acute abnormalities. Atrophy. Slight chronic small vessel ischemic disease.   Electronically Signed   By: Rozetta Nunnery M.D.   On: 03/14/2014 20:50   Dg Chest Port 1 View  03/16/2014   CLINICAL DATA:  Shortness of breath.  EXAM: PORTABLE CHEST - 1 VIEW  COMPARISON:  03/14/2014  FINDINGS: Two lead pacemaker in place. There is slight tortuosity and calcification of the thoracic aorta. There is slight pulmonary vascular prominence. No infiltrates or effusions. Calcified granulomas in both lungs. Focal atelectasis at the left lung base.  IMPRESSION: New slight pulmonary vascular prominence. Atelectasis the left lung base.   Electronically Signed   By: Rozetta Nunnery M.D.   On: 03/16/2014 09:23   Dg Chest Port 1 View  03/14/2014   CLINICAL  DATA:  80 year old male with fever and cough.  EXAM: PORTABLE CHEST - 1 VIEW  COMPARISON:  01/06/2010  FINDINGS: Mild cardiomegaly and left-sided pacemaker again noted.  There is no evidence of focal airspace disease, pulmonary edema, suspicious pulmonary nodule/mass, pleural effusion, or pneumothorax. No acute bony abnormalities are identified.  IMPRESSION: No active disease.   Electronically Signed   By: Hassan Rowan M.D.   On: 03/14/2014 19:31    Scheduled Meds: . antiseptic oral rinse  15 mL Mouth Rinse BID  . benzonatate  200 mg Oral TID  . cholecalciferol  2,000 Units Oral Daily  . doxazosin  2 mg Oral Daily  . losartan  50 mg Oral Daily  . piperacillin-tazobactam (ZOSYN)  IV  3.375 g Intravenous 3 times per day  . Rivaroxaban  15 mg Oral QPC supper   Continuous Infusions:   Principal Problem:   Delirium, acute Active Problems:   Essential hypertension, benign   Atrial fibrillation   Pacemaker-St.Jude   Anemia of chronic disease   Altered mental status   Fever    Time spent: 35 min    [facility] Pager [phone]. If 7PM-7AM, please contact night-coverage at [email or web address], password [facility] 03/16/2014, 10:39 AM  LOS: 2 days

## 2014-03-17 ENCOUNTER — Inpatient Hospital Stay (HOSPITAL_COMMUNITY): Payer: Medicare Other

## 2014-03-17 LAB — CBC
HEMATOCRIT: 34.1 % — AB (ref 39.0–52.0)
HEMOGLOBIN: 11 g/dL — AB (ref 13.0–17.0)
MCH: 30.4 pg (ref 26.0–34.0)
MCHC: 32.3 g/dL (ref 30.0–36.0)
MCV: 94.2 fL (ref 78.0–100.0)
Platelets: 244 10*3/uL (ref 150–400)
RBC: 3.62 MIL/uL — ABNORMAL LOW (ref 4.22–5.81)
RDW: 14.7 % (ref 11.5–15.5)
WBC: 10.9 10*3/uL — ABNORMAL HIGH (ref 4.0–10.5)

## 2014-03-17 LAB — BASIC METABOLIC PANEL
BUN: 26 mg/dL — ABNORMAL HIGH (ref 6–23)
CALCIUM: 8.4 mg/dL (ref 8.4–10.5)
CO2: 23 mEq/L (ref 19–32)
CREATININE: 0.97 mg/dL (ref 0.50–1.35)
Chloride: 107 mEq/L (ref 96–112)
GFR calc Af Amer: 78 mL/min — ABNORMAL LOW (ref >90)
GFR, EST NON AFRICAN AMERICAN: 67 mL/min — AB (ref >90)
Glucose, Bld: 127 mg/dL — ABNORMAL HIGH (ref 70–99)
Potassium: 4.2 mEq/L (ref 3.7–5.3)
Sodium: 139 mEq/L (ref 137–147)

## 2014-03-17 MED ORDER — FUROSEMIDE 10 MG/ML IJ SOLN
INTRAMUSCULAR | Status: AC
  Administered 2014-03-17: 20 mg via INTRAVENOUS
  Filled 2014-03-17: qty 4

## 2014-03-17 MED ORDER — FUROSEMIDE 10 MG/ML IJ SOLN
20.0000 mg | Freq: Once | INTRAMUSCULAR | Status: AC
  Administered 2014-03-17: 20 mg via INTRAVENOUS

## 2014-03-17 NOTE — Clinical Social Work Psychosocial (Signed)
Clinical Social Work Department BRIEF PSYCHOSOCIAL ASSESSMENT 03/17/2014  Patient:  CRECENCIO, [id number]     Account Number:  [id number]     Admit date:  03/14/2014  Clinical Social Worker:  Lovey Newcomer  Date/Time:  03/17/2014 02:00 PM  Referred by:  Physician  Date Referred:  03/17/2014 Referred for  SNF Placement   Other Referral:   Interview type:  Family Other interview type:   Patient's son (HPOA) interviewed by phone to complete assessment.    PSYCHOSOCIAL DATA Living Status:  FACILITY Admitted from facility:  [facility] Level of care:  Assisted Living Primary support name:  Tommie Raymond Primary support relationship to patient:  CHILD, ADULT Degree of support available:   Support is strong.    CURRENT CONCERNS Current Concerns  Post-Acute Placement   Other Concerns:    SOCIAL WORK ASSESSMENT / PLAN CSW spoke with patient's son by phone to complete assessment as no family present at bedside. Patient's son Tommie Raymond states that patient has been at [facility] for about 15 years and started with independent living. Patient has been in assisted living for a while, and family is now planning to transition him to SNF level. Family is very happy with the care the patient receives at the facility and intends for him to return at discharge.   Assessment/plan status:  Psychosocial Support/Ongoing Assessment of Needs Other assessment/ plan:   Complete FL2, Fax,   Information/referral to community resources:   CSW contact information given to son.    PATIENT'S/FAMILY'S RESPONSE TO PLAN OF CARE: Patient's son plans for patient to return to [facility] at discharge. Patient's son states, "We were prepared for this and think this is best for him." Son was pleasant, appropriate, and appreciative of CSW contact.       Liz Beach MSW, [place], [place], [phone]

## 2014-03-17 NOTE — Evaluation (Signed)
Physical Therapy Evaluation Patient Details Name: Nicholas Hood MRN: [id number] DOB: 04-04-1916 Today's Date: 03/17/2014   History of Present Illness  80 yo male lives at snf was started on levaquin 2 days ago for uti.  That urine cx grew out "contaminate" per son who is present with him in ED.  Today he got more confused and spiked a fever up to 102.  He has been coughing for several days.  No rashes.  No n/v/d.  Pt is normally sharp mentation and ambulates with assistance with walker.  Clinical Impression  *Pt admitted with *sepsis 2* UTI, AMS, ? PNA**. Pt currently with functional limitations due to the deficits listed below (see PT Problem List).  Pt will benefit from skilled PT to increase their independence and safety with mobility to allow discharge to the venue listed below.   **    Follow Up Recommendations SNF    Equipment Recommendations  None recommended by PT    Recommendations for Other Services       Precautions / Restrictions Precautions Precautions: Fall Precaution Comments: monitor RR Restrictions Weight Bearing Restrictions: No      Mobility  Bed Mobility Overal bed mobility: Needs Assistance;+2 for physical assistance Bed Mobility: Supine to Sit;Sit to Supine     Supine to sit: +2 for physical assistance;Total assist Sit to supine: +2 for physical assistance;Total assist   General bed mobility comments: pt 20%, very lethargic  Transfers                 General transfer comment: NT- RR up to 44 with supine to sit on 2L O2  Ambulation/Gait                Stairs            Wheelchair Mobility    Modified Rankin (Stroke Patients Only)       Balance Overall balance assessment: Needs assistance Sitting-balance support: Bilateral upper extremity supported;Feet unsupported Sitting balance-Leahy Scale: Fair                                       Pertinent Vitals/Pain *Respiratory Rate 44 with bed mobility SaO2  95% on 2L O2 0/10 pain**    Home Living Family/patient expects to be discharged to:: Assisted living               Home Equipment: Walker - 2 wheels Additional Comments: Per chart pt walked with RW at ALF. Pt confused, unable to provide Hx.     Prior Function           Comments: Unknown: family not present, pt confused     Hand Dominance        Extremity/Trunk Assessment   Upper Extremity Assessment: Difficult to assess due to impaired cognition           Lower Extremity Assessment: Difficult to assess due to impaired cognition      Cervical / Trunk Assessment: Normal  Communication   Communication:  (limited by confusion/fatigue)  Cognition Arousal/Alertness: Lethargic   Overall Cognitive Status: Impaired/Different from baseline Area of Impairment: Orientation;Following commands Orientation Level: Situation;Place;Time     Following Commands: Follows one step commands with increased time       General Comments: pt lethargic, not oriented to place or situation, unable to provide prior functional level    General Comments      Exercises  Assessment/Plan    PT Assessment Patient needs continued PT services  PT Diagnosis Difficulty walking;Generalized weakness;Altered mental status   PT Problem List Decreased strength;Decreased mobility;Decreased activity tolerance;Decreased cognition;Cardiopulmonary status limiting activity;Obesity  PT Treatment Interventions Gait training;DME instruction;Functional mobility training;Therapeutic activities;Therapeutic exercise;Balance training;Patient/family education   PT Goals (Current goals can be found in the Care Plan section) Acute Rehab PT Goals Patient Stated Goal: none stated, pt not oriented PT Goal Formulation: Patient unable to participate in goal setting Time For Goal Achievement: 03/31/14 Potential to Achieve Goals: Good    Frequency Min 3X/week   Barriers to discharge         Co-evaluation               End of Session   Activity Tolerance: Patient limited by lethargy;Patient limited by fatigue;Treatment limited secondary to medical complications (Comment) (RR 44 with minimal activity, RN notified) Patient left: in bed;with call bell/phone within reach;with bed alarm set Nurse Communication: Mobility status         Time: 1356-1410 PT Time Calculation (min): 14 min   Charges:   PT Evaluation $Initial PT Evaluation Tier I: 1 Procedure PT Treatments $Therapeutic Activity: 8-22 mins   PT G CodesLucile Crater 03/17/2014, 2:23 PM [phone]

## 2014-03-17 NOTE — Evaluation (Signed)
Clinical/Bedside Swallow Evaluation Patient Details  Name: Nicholas Hood MRN: [id number] Date of Birth: 12-05-1915  Today's Date: 03/17/2014 Time: 2671-2458 SLP Time Calculation (min): 16 min  Past Medical History:  Past Medical History  Diagnosis Date  . Hypertension   . Prostate cancer   . Spinal stenosis   . Peripheral neuropathy   . Diverticulosis   . Complete heart block     s/p PPM  . Vitamin D deficiency   . Pacemaker     PPM- St Jude  . Paroxysmal atrial fibrillation   . Contusion of face, scalp, and neck except eye(s) 04/23/2012  . Abnormal weight gain 04/12/2012  . Fall from other slipping, tripping, or stumbling 04/08/2012  . Congestive heart failure, unspecified 03/05/2012  . Shortness of breath 03/05/2012  . Unspecified hypertrophic and atrophic condition of skin 01/16/2012  . Sebaceous cyst 01/16/2012  . Unspecified constipation 11/28/2011  . Edema 04/12/2010  . Other specified cardiac dysrhythmias(427.89) 01/03/2010  . Other premature beats 06/02/2008  . Other malaise and fatigue 01/28/2008  . Nonspecific abnormal electrocardiogram (ECG) (EKG) 01/28/2008  . Internal hemorrhoids without mention of complication 0/99/8338  . Unspecified hearing loss 02/19/2007  . Osteoarthrosis, unspecified whether generalized or localized, unspecified site 02/19/2007  . Left bundle branch hemiblock 06/04/1999  . Abnormality of gait 06/04/1999  . Aortic valve disorders 06/04/1979   Past Surgical History:  Past Surgical History  Procedure Laterality Date  . Pacemaker insertion      PPM- St Jude-3/11  . Back surgery  2000    Spinal stenosis Dr. Achilles Dunk  . Nasal polyp excision    . Tonsillectomy    . Cataract extraction w/ intraocular lens  implant, bilateral  10/2009    Dr. Ellie Lunch  . Pacemaker placement  01/05/2010  . Colonoscopy  07/25/2007    int hem. diverticulosis  Dr. Lajoyce Corners   HPI:  80 yo male who lives at SNF who was being treated for UTI, came to ED 03/14/14 with son due to increased  confusion, cough, and fever of 102. Pt has had several CXR since admission, all with no overt signs of PNA.   Assessment / Plan / Recommendation Clinical Impression  Although no overt s/s of aspiration were observed throughout PO trials this afternoon, pt's SOB puts him at an increased risk. He appears to hold his breath until completion of the swallow with thin liquids and purees, however with soft solid foods he continues to take quick breaths while food is still being manipulated in his mouth. Recommend Dys 2 textures and thin liquids to facilitate oral clearance and decrease aspiration risk. SLP to monitor for tolerance.    Aspiration Risk  Moderate    Diet Recommendation Dysphagia 2 (Fine chop);Thin liquid   Liquid Administration via: Cup;Straw Medication Administration: Whole meds with puree Supervision: Staff to assist with self feeding;Full supervision/cueing for compensatory strategies Compensations: Slow rate;Small sips/bites Postural Changes and/or Swallow Maneuvers: Seated upright 90 degrees    Other  Recommendations Oral Care Recommendations: Oral care BID   Follow Up Recommendations  Skilled Nursing facility (TBD)    Frequency and Duration min 2x/week  2 weeks   Pertinent Vitals/Pain N/A    SLP Swallow Goals     Swallow Study Prior Functional Status       General HPI: 80 yo male who lives at SNF who was being treated for UTI, came to ED 03/14/14 with son due to increased confusion, cough, and fever of 102. Pt has had several  CXR since admission, all with no overt signs of PNA. Type of Study: Bedside swallow evaluation Previous Swallow Assessment: none in chart Diet Prior to this Study: NPO Temperature Spikes Noted: Yes Respiratory Status: Nasal cannula History of Recent Intubation: No Behavior/Cognition: Alert;Cooperative;Confused;Requires cueing Oral Cavity - Dentition: Adequate natural dentition Self-Feeding Abilities: Total assist Patient Positioning:  Upright in bed Baseline Vocal Quality: Low vocal intensity Volitional Cough: Strong (volitional cough triggered prolonged reflexive coughing episode) Volitional Swallow: Unable to elicit    Oral/Motor/Sensory Function     Ice Chips Ice chips: Not tested   Thin Liquid Thin Liquid: Impaired Presentation: Cup;Straw Pharyngeal  Phase Impairments: Other (comments) (SOB)    Nectar Thick Nectar Thick Liquid: Not tested   Honey Thick Honey Thick Liquid: Not tested   Puree Puree: Impaired Presentation: Spoon Pharyngeal Phase Impairments: Other (comments) (SOB)   Solid   GO    Solid: Impaired Pharyngeal Phase Impairments: Other (comments) (SOB)        Germain Osgood, M.A. CCC-SLP [phone]  Germain Osgood 03/17/2014,4:48 PM

## 2014-03-17 NOTE — Progress Notes (Addendum)
[facility] PROGRESS NOTE  Nicholas Hood Nicholas Hood DOB: 1915-11-13 DOA: 03/14/2014 PCP: Estill Dooms, MD  Interim Summary Patient is a pleasant 80 year old gentleman with a past medical history of prostate cancer, hypertension, peripheral neuropathy, nursing home resident who presented to the emergency room as a transfer from his facility on 03/14/2014  noted by nursing home staff to have a steep functional decline along with temperature of 102. It appears that he was diagnosed with a urinary tract infection 2 days prior to admission and started on Levaquin therapy. Initial lab work revealed a white count of 17,200. On admission he was started on IV Zosyn and vancomycin. Blood cultures drawn on 03/14/2014 growing  gram-negative rods 1/2 cultures. I suspect source of infection to be urinary tract infection. Vancomycin was discontinued as he was maintained on Zosyn therapy. Patient showing overall clinical improvement as lab work has revealed a downward trend in his white count, going from 17,200 on admission to 10,900 by 03/17/2014. He has not spike further temperatures. Currently awaiting blood culture susceptibility testing. Physical therapy consulted. Will need to follow up on repeat blood cultures. On 03/17/2014 patient appear to have functional decline, appeared to be less active, sleeping more throughout the day. Vital signs stable, patient remained afebrile. Sats in the mid 90s on 2 L supplemental oxygen. His son reports ongoing cough.                                                                        Assessment/Plan: 1. Sepsis  -Present on admission, evidenced by white count of 17,200, temperature 102 per nurse report, respiratory of 34, positive blood culture -Source of infection likely to be urinary tract. Chest x-ray did not reveal acute cardiopulmonary changes -Blood cultures now growing a gram negative rod in 1/2 cultures, susceptibility tested in progress  -Continue IV  Zosyn  2.  Urinary tract infection -Patient apparently diagnosed with UTI at a skilled nursing facility and started on Levaquin -Continue empiric antibiotic therapy with Zosyn until blood cultures finalized.  -Blood culture growing gram negative rod  3. Bacteremia -Lab reporting gram negative rods growing out of 1/2 cultures, will follow susceptibility testing -Will repeat blood cultures today, meanwhile continue Zosyn.  -Follow up on repeat blood cultures drawn on 03/16/14  4.  Cough -Patient reporting cough at his skilled nursing facility although chest x-ray was negative. -Given cough and patient's decline today  -Could be due to viral PNA or aspiration. He is not on IV fluids although recent CXR on 5/26 showing slight pulmonary vascular prominence. WiIl give Lasix 20 mg IV x1 -Will consult speech therapy as I wonder if cough could also be related to aspiration  5. Metabolic encephalopathy -Likely secondary to underlying infectious process -Started on empiric IV antibiotic therapy  6. Hypertension -Continue losartan 50 mg by mouth daily. Blood pressures are controlled  Code Status: DO NOT RESUSCITATE Family Communication: Spoke with patient's son present at bedside Disposition Plan: Continue supportive care, IV antibiotics, anticipate discharge back to skilled nursing facility when stable   Antibiotics:  Zosyn IV  HPI/Subjective:   Objective: Filed Vitals:   03/17/14 0428  BP: 131/77  Pulse: 69  Temp: 97.2 F (36.2 C)  Resp: 20  Intake/Output Summary (Last 24 hours) at 03/17/14 1055 Last data filed at 03/17/14 0437  Gross per 24 hour  Intake    100 ml  Output    650 ml  Net   -550 ml   Filed Weights   03/15/14 0038 03/16/14 0412 03/17/14 0428  Weight: 97.886 kg (215 lb 12.8 oz) 96.979 kg (213 lb 12.8 oz) 97.886 kg (215 lb 12.8 oz)    Exam:   General:  Ill appearing, is arousable, does follow commands  Cardiovascular: Regular rate rhythm normal  S1-S2  Respiratory: Coarse respiratory sounds, normal respiratory effort  Abdomen: Soft nontender nondistended  Musculoskeletal: Trace edema to lower extremities   Data Reviewed: Basic Metabolic Panel:  Recent Labs Lab 03/13/14 03/14/14 1800 03/15/14 0705 03/16/14 0441 03/17/14 0627  NA 138 138 139 140 139  K 4.3 4.2 4.2 4.2 4.2  CL  --  105 105 107 107  CO2  --  22 21 22 23   GLUCOSE  --  98 106* 105* 127*  BUN 29* 28* 22 22 26*  CREATININE 1.0 0.99 0.92 0.98 0.97  CALCIUM  --  8.5 8.5 8.5 8.4   Liver Function Tests:  Recent Labs Lab 03/13/14 03/14/14 1800  AST 32 21  ALT 18 17  ALKPHOS 49 59  BILITOT  --  0.3  PROT  --  7.1  ALBUMIN  --  2.3*   No results found for this basename: LIPASE, AMYLASE,  in the last 168 hours No results found for this basename: AMMONIA,  in the last 168 hours CBC:  Recent Labs Lab 03/13/14 03/14/14 1800 03/15/14 0705 03/16/14 0441 03/17/14 0627  WBC 17.2 13.3* 12.4* 11.1* 10.9*  NEUTROABS  --  11.2*  --   --   --   HGB 11.6* 9.8* 10.7* 10.6* 11.0*  HCT 33* 30.5* 31.6* 31.7* 34.1*  MCV  --  93.6 92.7 93.5 94.2  PLT 208 207 212 248 244   Cardiac Enzymes: No results found for this basename: CKTOTAL, CKMB, CKMBINDEX, TROPONINI,  in the last 168 hours BNP (last 3 results) No results found for this basename: PROBNP,  in the last 8760 hours CBG: No results found for this basename: GLUCAP,  in the last 168 hours  Recent Results (from the past 240 hour(s))  URINE CULTURE     Status: None   Collection Time    03/14/14  5:31 PM      Result Value Ref Range Status   Specimen Description URINE, CATHETERIZED   Final   Special Requests NONE   Final   Culture  Setup Time     Final   Value: 03/15/2014 03:47     Performed at [place]     Final   Value: NO GROWTH     Performed at [facility]   Culture     Final   Value: NO GROWTH     Performed at [facility]   Report Status 03/16/2014  FINAL   Final  CULTURE, BLOOD (ROUTINE X 2)     Status: None   Collection Time    03/14/14  6:00 PM      Result Value Ref Range Status   Specimen Description BLOOD RIGHT ARM   Final   Special Requests BOTTLES DRAWN AEROBIC AND ANAEROBIC 5CC   Final   Culture  Setup Time     Final   Value: 03/15/2014 03:14     Performed at [facility]   Culture     Final   Value: GRAM NEGATIVE RODS     Note: Gram Stain Report Called to,Read Back By and Verified With: KATIE B BY INGRAM A 03/16/14 1025AM     Performed at [facility]   Report Status PENDING   Incomplete  CULTURE, BLOOD (ROUTINE X 2)     Status: None   Collection Time    03/14/14  6:20 PM      Result Value Ref Range Status   Specimen Description BLOOD RIGHT ARM   Final   Special Requests BOTTLES DRAWN AEROBIC AND ANAEROBIC 10CC   Final   Culture  Setup Time     Final   Value: 03/15/2014 03:14     Performed at [facility]   Culture     Final   Value:        BLOOD CULTURE RECEIVED NO GROWTH TO DATE CULTURE WILL BE HELD FOR 5 DAYS BEFORE ISSUING A FINAL NEGATIVE REPORT     Performed at [facility]   Report Status PENDING   Incomplete  MRSA PCR SCREENING     Status: None   Collection Time    03/15/14 12:22 AM      Result Value Ref Range Status   MRSA by PCR NEGATIVE  NEGATIVE Final   Comment:            The GeneXpert MRSA Assay (FDA     approved for NASAL specimens     only), is one component of a     comprehensive MRSA colonization     surveillance program. It is not     intended to diagnose MRSA     infection nor to guide or     monitor treatment for     MRSA infections.  CULTURE, BLOOD (ROUTINE X 2)     Status: None   Collection Time    03/16/14  3:21 PM      Result Value Ref Range Status   Specimen Description BLOOD RIGHT HAND   Final   Special Requests BOTTLES DRAWN AEROBIC ONLY 10CC   Final   Culture  Setup Time     Final   Value: 03/16/2014 22:40     Performed at [facility]    Culture     Final   Value:        BLOOD CULTURE RECEIVED NO GROWTH TO DATE CULTURE WILL BE HELD FOR 5 DAYS BEFORE ISSUING A FINAL NEGATIVE REPORT     Performed at [facility]   Report Status PENDING   Incomplete  CULTURE, BLOOD (ROUTINE X 2)     Status: None   Collection Time    03/16/14  3:28 PM      Result Value Ref Range Status   Specimen Description BLOOD LEFT HAND   Final   Special Requests BOTTLES DRAWN AEROBIC ONLY 10CC   Final   Culture  Setup Time     Final   Value: 03/16/2014 22:40     Performed at [facility]   Culture     Final   Value:        BLOOD CULTURE RECEIVED NO GROWTH TO DATE CULTURE WILL BE HELD FOR 5 DAYS BEFORE ISSUING A FINAL NEGATIVE REPORT     Performed at [facility]   Report Status PENDING   Incomplete     Studies: Dg Chest Port 1 View  03/16/2014   CLINICAL DATA:  Shortness of breath.  EXAM: PORTABLE CHEST - 1 VIEW  COMPARISON:  03/14/2014  FINDINGS: Two lead pacemaker in place. There is slight tortuosity and calcification of the thoracic aorta. There is slight pulmonary vascular prominence. No infiltrates or effusions. Calcified granulomas in both lungs. Focal atelectasis at the left lung base.  IMPRESSION: New slight pulmonary vascular prominence. Atelectasis the left lung base.   Electronically Signed   By: Rozetta Nunnery M.D.   On: 03/16/2014 09:23    Scheduled Meds: . antiseptic oral rinse  15 mL Mouth Rinse BID  . benzonatate  200 mg Oral TID  . cholecalciferol  2,000 Units Oral Daily  . docusate sodium  100 mg Oral BID  . doxazosin  2 mg Oral Daily  . losartan  50 mg Oral Daily  . piperacillin-tazobactam (ZOSYN)  IV  3.375 g Intravenous 3 times per day  . 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 If 7PM-7AM, please contact night-coverage at [email or web address], password [email or web address] 03/17/2014, 10:55 AM  LOS: 3 days

## 2014-03-18 DIAGNOSIS — R4182 Altered mental status, unspecified: Secondary | ICD-10-CM

## 2014-03-18 NOTE — Progress Notes (Signed)
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 Chest x-ray negative for pneumonia -Blood cultures now growing a gram negative rod in 1/2 cultures, susceptibility tested in progress  -Continue IV Zosyn, repeat blood cultures negative so far  Coughing - Chest x-ray negative, possibility of bronchitis, aspiration pneumonia, patient was given Lasix 20 mg IV x1 - Speech therapy was consulted, patient does have some dysphagia, placed on dysphagia 2 diet, thin liquids - Incentive spirometry, continue Zosyn, and -875 cc on output  Metabolic encephalopathy: Likely due to #1, prior patient's son at the bedside, his mental status is improving  Hypertension: Currently stable  DVT Prophylaxis:  Code Status:  Family Communication: Discussed in detail with patient's son at the bedside  Disposition: To skilled nursing facility possibly tomorrow or day after depending on blood cultures and medical stability  Consultants:  None  Procedures:  None  Antibiotics:  IV Zosyn    Subjective: Patient seen and examined, somewhat shallow breathing but no wheezing, no chest pain no fevers or chills, patient's son at the bedside  Objective: Weight change: 2.994 kg (6 lb 9.6 oz)  Intake/Output Summary (Last 24 hours) at 03/18/14 1200 Last data filed at 03/17/14 2021  Gross per 24 hour  Intake    480 ml  Output     300 ml  Net    180 ml   Blood pressure 105/79, pulse 69, temperature 97.7 F (36.5 C), temperature source Oral, resp. rate 20, height 5\' 11"  (1.803 m), weight 100.88 kg (222 lb 6.4 oz), SpO2 96.00%.  Physical Exam: General: Alert and awake, oriented, NAD, follow commands CVS: S1-S2 clear, no murmur rubs or gallops Chest: clear to auscultation bilaterally, no wheezing, rales or rhonchi Abdomen: soft nontender, nondistended, normal bowel sounds  Extremities: no cyanosis, clubbing , trace edema Neuro: Cranial nerves II-XII intact, no focal neurological deficits  Lab Results: Basic Metabolic Panel:  Recent Labs Lab 03/16/14 0441 03/17/14 0627  NA 140 139  K 4.2 4.2  CL 107 107  CO2 22 23  GLUCOSE 105* 127*  BUN 22 26*  CREATININE 0.98 0.97  CALCIUM 8.5 8.4   Liver Function Tests:  Recent Labs Lab 03/13/14 03/14/14 1800  AST 32 21  ALT 18 17  ALKPHOS 49 59  BILITOT  --  0.3  PROT  --  7.1  ALBUMIN  --  2.3*   No results found for this basename: LIPASE, AMYLASE,  in the last 168 hours No results found for this basename: AMMONIA,  in the last 168 hours CBC:  Recent Labs Lab 03/14/14 1800  03/16/14 0441 03/17/14 0627  WBC 13.3*  < > 11.1* 10.9*  NEUTROABS 11.2*  --   --   --   HGB 9.8*  < > 10.6* 11.0*  HCT 30.5*  < > 31.7* 34.1*  MCV 93.6  < > 93.5 94.2  PLT 207  < >  248 244  < > = values in this interval not displayed. Cardiac Enzymes: No results found for this basename: CKTOTAL, CKMB, CKMBINDEX, TROPONINI,  in the last 168 hours BNP: No components found with this basename: POCBNP,  CBG: No results found for this basename: GLUCAP,  in the last 168 hours   Micro Results: Recent Results (from the past 240 hour(s))  URINE CULTURE     Status: None   Collection Time    03/14/14  5:31 PM      Result Value Ref Range Status   Specimen Description URINE, CATHETERIZED   Final   Special Requests NONE   Final   Culture  Setup Time     Final   Value: 03/15/2014  03:47     Performed at [place]     Final   Value: NO GROWTH     Performed at [facility]   Culture     Final   Value: NO GROWTH     Performed at [facility]   Report Status 03/16/2014 FINAL   Final  CULTURE, BLOOD (ROUTINE X 2)     Status: None   Collection Time    03/14/14  6:00 PM      Result Value Ref Range Status   Specimen Description BLOOD RIGHT ARM   Final   Special Requests BOTTLES DRAWN AEROBIC AND ANAEROBIC 5CC   Final   Culture  Setup Time     Final   Value: 03/15/2014 03:14     Performed at [facility]   Culture     Final   Value: GRAM NEGATIVE RODS     Note: Gram Stain Report Called to,Read Back By and Verified With: KATIE B BY INGRAM A 03/16/14 1025AM     Performed at [facility]   Report Status PENDING   Incomplete  CULTURE, BLOOD (ROUTINE X 2)     Status: None   Collection Time    03/14/14  6:20 PM      Result Value Ref Range Status   Specimen Description BLOOD RIGHT ARM   Final   Special Requests BOTTLES DRAWN AEROBIC AND ANAEROBIC 10CC   Final   Culture  Setup Time     Final   Value: 03/15/2014 03:14     Performed at [facility]   Culture     Final   Value:        BLOOD CULTURE RECEIVED NO GROWTH TO DATE CULTURE WILL BE HELD FOR 5 DAYS BEFORE ISSUING A FINAL NEGATIVE REPORT     Performed at [facility]   Report Status PENDING   Incomplete  MRSA PCR SCREENING     Status: None   Collection Time    03/15/14 12:22 AM      Result Value Ref Range Status   MRSA by PCR NEGATIVE  NEGATIVE Final   Comment:            The GeneXpert MRSA Assay (FDA     approved for NASAL specimens     only), is one component of a     comprehensive MRSA colonization     surveillance program. It is not     intended to diagnose MRSA     infection nor to guide or     monitor treatment for     MRSA infections.  CULTURE, BLOOD (ROUTINE X 2)     Status: None   Collection Time  03/16/14  3:21 PM       Result Value Ref Range Status   Specimen Description BLOOD RIGHT HAND   Final   Special Requests BOTTLES DRAWN AEROBIC ONLY 10CC   Final   Culture  Setup Time     Final   Value: 03/16/2014 22:40     Performed at [facility]   Culture     Final   Value:        BLOOD CULTURE RECEIVED NO GROWTH TO DATE CULTURE WILL BE HELD FOR 5 DAYS BEFORE ISSUING A FINAL NEGATIVE REPORT     Performed at [facility]   Report Status PENDING   Incomplete  CULTURE, BLOOD (ROUTINE X 2)     Status: None   Collection Time    03/16/14  3:28 PM      Result Value Ref Range Status   Specimen Description BLOOD LEFT HAND   Final   Special Requests BOTTLES DRAWN AEROBIC ONLY 10CC   Final   Culture  Setup Time     Final   Value: 03/16/2014 22:40     Performed at [facility]   Culture     Final   Value:        BLOOD CULTURE RECEIVED NO GROWTH TO DATE CULTURE WILL BE HELD FOR 5 DAYS BEFORE ISSUING A FINAL NEGATIVE REPORT     Performed at [facility]   Report Status PENDING   Incomplete    Studies/Results: Dg Chest 2 View  03/17/2014   CLINICAL DATA:  Cough, shortness of breath.  EXAM: CHEST  2 VIEW  COMPARISON:  Mar 16, 2014.  FINDINGS: Stable cardiomegaly. Left-sided pacemaker is unchanged in position. Stable calcified granulomas are noted bilaterally. No pneumothorax or pleural effusion is noted. No acute pulmonary disease is noted. Bony thorax appears intact.  IMPRESSION: No acute cardiopulmonary abnormality seen.   Electronically Signed   By: Sabino Dick M.D.   On: 03/17/2014 14:39   Ct Head Wo Contrast  03/14/2014   CLINICAL DATA:  Fever.  Altered mental status.  EXAM: CT HEAD WITHOUT CONTRAST  TECHNIQUE: Contiguous axial images were obtained from the base of the skull through the vertex without intravenous contrast.  COMPARISON:  None.  FINDINGS: No mass lesion. No midline shift. No acute hemorrhage or hematoma. No extra-axial fluid collections. No evidence of acute infarction.  There is diffuse cerebral cortical and cerebellar atrophy. Minimal periventricular white matter lucency consistent with slight chronic small vessel ischemic disease. No significant osseous abnormalities. Tiny retention cysts in both maxillary sinuses.  There is slight midline shift which is not felt to be significant. There are no abnormal extra-axial fluid collections.  IMPRESSION: No acute abnormalities. Atrophy. Slight chronic small vessel ischemic disease.   Electronically Signed   By: Rozetta Nunnery M.D.   On: 03/14/2014 20:50   Dg Chest Port 1 View  03/16/2014   CLINICAL DATA:  Shortness of breath.  EXAM: PORTABLE CHEST - 1 VIEW  COMPARISON:  03/14/2014  FINDINGS: Two lead pacemaker in place. There is slight tortuosity and calcification of the thoracic aorta. There is slight pulmonary vascular prominence. No infiltrates or effusions. Calcified granulomas in both lungs. Focal atelectasis at the left lung base.  IMPRESSION: New slight pulmonary vascular prominence. Atelectasis the left lung base.   Electronically Signed   By: Rozetta Nunnery M.D.   On: 03/16/2014 09:23   Dg Chest Port 1 View  03/14/2014   CLINICAL DATA:  80 year old male  with fever and cough.  EXAM: PORTABLE CHEST - 1 VIEW  COMPARISON:  01/06/2010  FINDINGS: Mild cardiomegaly and left-sided pacemaker again noted.  There is no evidence of focal airspace disease, pulmonary edema, suspicious pulmonary nodule/mass, pleural effusion, or pneumothorax. No acute bony abnormalities are identified.  IMPRESSION: No active disease.   Electronically Signed   By: Hassan Rowan M.D.   On: 03/14/2014 19:31    Medications: Scheduled Meds: . antiseptic oral rinse  15 mL Mouth Rinse BID  . benzonatate  200 mg Oral TID  . cholecalciferol  2,000 Units Oral Daily  . docusate sodium  100 mg Oral BID  . doxazosin  2 mg Oral Daily  . losartan  50 mg Oral Daily  . piperacillin-tazobactam (ZOSYN)  IV  3.375 g Intravenous 3 times per day  . Rivaroxaban  15 mg Oral  QPC supper      LOS: 4 days   Angenette Daily Krystal Eaton M.D. [facility] 03/18/2014, 12:00 PM Pager: [phone]  If 7PM-7AM, please contact night-coverage [email or web address] Password [email or web address]  **Disclaimer: This note was dictated with voice recognition software. Similar sounding words can inadvertently be transcribed and this note may contain transcription errors which may not have been corrected upon publication of note.**

## 2014-03-19 DIAGNOSIS — I442 Atrioventricular block, complete: Secondary | ICD-10-CM

## 2014-03-19 LAB — BASIC METABOLIC PANEL
BUN: 22 mg/dL (ref 6–23)
CHLORIDE: 105 meq/L (ref 96–112)
CO2: 24 mEq/L (ref 19–32)
CREATININE: 0.94 mg/dL (ref 0.50–1.35)
Calcium: 8.6 mg/dL (ref 8.4–10.5)
GFR, EST AFRICAN AMERICAN: 79 mL/min — AB (ref >90)
GFR, EST NON AFRICAN AMERICAN: 68 mL/min — AB (ref >90)
Glucose, Bld: 108 mg/dL — ABNORMAL HIGH (ref 70–99)
Potassium: 4.3 mEq/L (ref 3.7–5.3)
Sodium: 138 mEq/L (ref 137–147)

## 2014-03-19 LAB — CBC
HEMATOCRIT: 31.6 % — AB (ref 39.0–52.0)
Hemoglobin: 10.5 g/dL — ABNORMAL LOW (ref 13.0–17.0)
MCH: 31.2 pg (ref 26.0–34.0)
MCHC: 33.2 g/dL (ref 30.0–36.0)
MCV: 93.8 fL (ref 78.0–100.0)
PLATELETS: 269 10*3/uL (ref 150–400)
RBC: 3.37 MIL/uL — ABNORMAL LOW (ref 4.22–5.81)
RDW: 14.5 % (ref 11.5–15.5)
WBC: 11.6 10*3/uL — AB (ref 4.0–10.5)

## 2014-03-19 MED ORDER — LEVOFLOXACIN 500 MG PO TABS
500.0000 mg | ORAL_TABLET | Freq: Every day | ORAL | Status: DC
  Administered 2014-03-19 – 2014-03-20 (×2): 500 mg via ORAL
  Filled 2014-03-19 (×2): qty 1

## 2014-03-19 NOTE — Progress Notes (Signed)
Patient ID: DEMARRION MEIKLEJOHN  male  TMH:[id number]    DOB: [birth year]    DOA: 03/14/2014  PCP: Estill Dooms, MD  Interim Summary  Patient is a pleasant 80 year old gentleman with a past medical history of prostate cancer, hypertension, peripheral neuropathy, nursing home resident who presented to the emergency room as a transfer from his facility on 03/14/2014 noted by nursing home staff to have a steep functional decline along with temperature of 102. It appears that he was diagnosed with a urinary tract infection 2 days prior to admission and started on Levaquin therapy. Initial lab work revealed a white count of 17,200. On admission he was started on IV Zosyn and vancomycin. Blood cultures drawn on 03/14/2014 growing gram-negative rods 1/2 cultures. I suspect source of infection to be urinary tract infection. Vancomycin was discontinued as he was maintained on Zosyn therapy. Patient showing overall clinical improvement as lab work has revealed a downward trend in his white count, going from 17,200 on admission to 10,900 by 03/17/2014. He has not spike further temperatures. Currently awaiting blood culture susceptibility testing.  Physical therapy consulted. Will need to follow up on repeat blood cultures. On 03/17/2014 patient appear to have functional decline, appeared to be less active, sleeping more throughout the day. Vital signs stable, patient remained afebrile. Sats in the mid 90s on 2 L supplemental oxygen. His son reported ongoing cough.      Assessment/Plan:   Gram-negative rods sepsis: Likely from UTI - -Present on admission, evidenced by white count of 17,200, temperature 102 per nurse report, respiratory of 34, positive blood culture  -Source of infection likely to be urinary tract, patient was on Levaquin for 2 days prior to admission, urine culture hence neg. Chest x-ray negative for pneumonia -Blood cultures now growing a gram negative rod in 1/2 cultures 5/25, susceptibility  tested still in progress. I spoke with [facility] [employer], they reported that patient has gram-negative rods in blood but insufficient growth so far for susceptibilities. They will report the final cultures tomorrow either with possible susceptibilities or report as insufficient sample. As patient was on Levaquin prior to admission, likely that caused his urine culture during hospitalization to be negative and insufficient growth on the blood cultures drawn on admission. Patient wants his IV off, hence I have placed him on Levaquin starting today, for total of 2 weeks for gram-negative sepsis and await final report from the lab tomorrow morning   Coughing - Chest x-ray negative, possibility of bronchitis, aspiration pneumonia, patient was given Lasix 20 mg IV x1 - Speech therapy was consulted, patient does have some dysphagia, placed on dysphagia 2 diet, thin liquids - Incentive spirometry, I have discontinued Zosyn, placed on levofloxacin  Metabolic encephalopathy: Likely due to #1, patient's son at the bedside, mental status close to baseline  Hypertension: Currently stable  DVT Prophylaxis:  Code Status:  Family Communication: Discussed in detail with patient's son at the bedside  Disposition: To skilled nursing facility possibly tomorrow  Consultants:  None  Procedures:  None  Antibiotics:  IV Zosyn dc 5/28    Subjective: Patient seen and examined, no active complaints, no acute issues overnight, no fevers or chills. Very weak    Objective: Weight change: 0.01 kg (0.4 oz)  Intake/Output Summary (Last 24 hours) at 03/19/14 1303 Last data filed at 03/19/14 1146  Gross per 24 hour  Intake      0 ml  Output   1650 ml  Net  -1650 ml  Blood pressure 120/61, pulse 69, temperature 97.8 F (36.6 C), temperature source Oral, resp. rate 20, height 5\' 11"  (1.803 m), weight 100.89 kg (222 lb 6.8 oz), SpO2 96.00%.  Physical Exam: General: Alert and awake, oriented, NAD,  follow commands CVS: S1-S2 clear, no murmur rubs or gallops Chest: decreased breath sounds, poor effort Abdomen: soft nontender, nondistended, normal bowel sounds  Extremities: no cyanosis, clubbing , trace edema Neuro: Cranial nerves II-XII intact, no focal neurological deficits  Lab Results: Basic Metabolic Panel:  Recent Labs Lab 03/17/14 0627 03/19/14 0613  NA 139 138  K 4.2 4.3  CL 107 105  CO2 23 24  GLUCOSE 127* 108*  BUN 26* 22  CREATININE 0.97 0.94  CALCIUM 8.4 8.6   Liver Function Tests:  Recent Labs Lab 03/13/14 03/14/14 1800  AST 32 21  ALT 18 17  ALKPHOS 49 59  BILITOT  --  0.3  PROT  --  7.1  ALBUMIN  --  2.3*   No results found for this basename: LIPASE, AMYLASE,  in the last 168 hours No results found for this basename: AMMONIA,  in the last 168 hours CBC:  Recent Labs Lab 03/14/14 1800  03/17/14 0627 03/19/14 0613  WBC 13.3*  < > 10.9* 11.6*  NEUTROABS 11.2*  --   --   --   HGB 9.8*  < > 11.0* 10.5*  HCT 30.5*  < > 34.1* 31.6*  MCV 93.6  < > 94.2 93.8  PLT 207  < > 244 269  < > = values in this interval not displayed. Cardiac Enzymes: No results found for this basename: CKTOTAL, CKMB, CKMBINDEX, TROPONINI,  in the last 168 hours BNP: No components found with this basename: POCBNP,  CBG: No results found for this basename: GLUCAP,  in the last 168 hours   Micro Results: Recent Results (from the past 240 hour(s))  URINE CULTURE     Status: None   Collection Time    03/14/14  5:31 PM      Result Value Ref Range Status   Specimen Description URINE, CATHETERIZED   Final   Special Requests NONE   Final   Culture  Setup Time     Final   Value: 03/15/2014 03:47     Performed at [facility]     Final   Value: NO GROWTH     Performed at [facility]   Culture     Final   Value: NO GROWTH     Performed at [facility]   Report Status 03/16/2014 FINAL   Final  CULTURE, BLOOD (ROUTINE X 2)      Status: None   Collection Time    03/14/14  6:00 PM      Result Value Ref Range Status   Specimen Description BLOOD RIGHT ARM   Final   Special Requests BOTTLES DRAWN AEROBIC AND ANAEROBIC 5CC   Final   Culture  Setup Time     Final   Value: 03/15/2014 03:14     Performed at [facility]   Culture     Final   Value: GRAM NEGATIVE RODS     Note: Gram Stain Report Called to,Read Back By and Verified With: KATIE B BY INGRAM A 03/16/14 1025AM     Performed at [facility]   Report Status PENDING   Incomplete  CULTURE, BLOOD (ROUTINE X 2)     Status: None   Collection Time  03/14/14  6:20 PM      Result Value Ref Range Status   Specimen Description BLOOD RIGHT ARM   Final   Special Requests BOTTLES DRAWN AEROBIC AND ANAEROBIC 10CC   Final   Culture  Setup Time     Final   Value: 03/15/2014 03:14     Performed at [facility]   Culture     Final   Value:        BLOOD CULTURE RECEIVED NO GROWTH TO DATE CULTURE WILL BE HELD FOR 5 DAYS BEFORE ISSUING A FINAL NEGATIVE REPORT     Performed at [facility]   Report Status PENDING   Incomplete  MRSA PCR SCREENING     Status: None   Collection Time    03/15/14 12:22 AM      Result Value Ref Range Status   MRSA by PCR NEGATIVE  NEGATIVE Final   Comment:            The GeneXpert MRSA Assay (FDA     approved for NASAL specimens     only), is one component of a     comprehensive MRSA colonization     surveillance program. It is not     intended to diagnose MRSA     infection nor to guide or     monitor treatment for     MRSA infections.  CULTURE, BLOOD (ROUTINE X 2)     Status: None   Collection Time    03/16/14  3:21 PM      Result Value Ref Range Status   Specimen Description BLOOD RIGHT HAND   Final   Special Requests BOTTLES DRAWN AEROBIC ONLY 10CC   Final   Culture  Setup Time     Final   Value: 03/16/2014 22:40     Performed at [facility]   Culture     Final   Value:        BLOOD  CULTURE RECEIVED NO GROWTH TO DATE CULTURE WILL BE HELD FOR 5 DAYS BEFORE ISSUING A FINAL NEGATIVE REPORT     Performed at [facility]   Report Status PENDING   Incomplete  CULTURE, BLOOD (ROUTINE X 2)     Status: None   Collection Time    03/16/14  3:28 PM      Result Value Ref Range Status   Specimen Description BLOOD LEFT HAND   Final   Special Requests BOTTLES DRAWN AEROBIC ONLY 10CC   Final   Culture  Setup Time     Final   Value: 03/16/2014 22:40     Performed at [facility]   Culture     Final   Value:        BLOOD CULTURE RECEIVED NO GROWTH TO DATE CULTURE WILL BE HELD FOR 5 DAYS BEFORE ISSUING A FINAL NEGATIVE REPORT     Performed at [facility]   Report Status PENDING   Incomplete    Studies/Results: Dg Chest 2 View  03/17/2014   CLINICAL DATA:  Cough, shortness of breath.  EXAM: CHEST  2 VIEW  COMPARISON:  Mar 16, 2014.  FINDINGS: Stable cardiomegaly. Left-sided pacemaker is unchanged in position. Stable calcified granulomas are noted bilaterally. No pneumothorax or pleural effusion is noted. No acute pulmonary disease is noted. Bony thorax appears intact.  IMPRESSION: No acute cardiopulmonary abnormality seen.   Electronically Signed   By: Sabino Dick M.D.   On: 03/17/2014 14:39   Ct  Head Wo Contrast  03/14/2014   CLINICAL DATA:  Fever.  Altered mental status.  EXAM: CT HEAD WITHOUT CONTRAST  TECHNIQUE: Contiguous axial images were obtained from the base of the skull through the vertex without intravenous contrast.  COMPARISON:  None.  FINDINGS: No mass lesion. No midline shift. No acute hemorrhage or hematoma. No extra-axial fluid collections. No evidence of acute infarction. There is diffuse cerebral cortical and cerebellar atrophy. Minimal periventricular white matter lucency consistent with slight chronic small vessel ischemic disease. No significant osseous abnormalities. Tiny retention cysts in both maxillary sinuses.  There is slight midline shift  which is not felt to be significant. There are no abnormal extra-axial fluid collections.  IMPRESSION: No acute abnormalities. Atrophy. Slight chronic small vessel ischemic disease.   Electronically Signed   By: Rozetta Nunnery M.D.   On: 03/14/2014 20:50   Dg Chest Port 1 View  03/16/2014   CLINICAL DATA:  Shortness of breath.  EXAM: PORTABLE CHEST - 1 VIEW  COMPARISON:  03/14/2014  FINDINGS: Two lead pacemaker in place. There is slight tortuosity and calcification of the thoracic aorta. There is slight pulmonary vascular prominence. No infiltrates or effusions. Calcified granulomas in both lungs. Focal atelectasis at the left lung base.  IMPRESSION: New slight pulmonary vascular prominence. Atelectasis the left lung base.   Electronically Signed   By: Rozetta Nunnery M.D.   On: 03/16/2014 09:23   Dg Chest Port 1 View  03/14/2014   CLINICAL DATA:  80 year old male with fever and cough.  EXAM: PORTABLE CHEST - 1 VIEW  COMPARISON:  01/06/2010  FINDINGS: Mild cardiomegaly and left-sided pacemaker again noted.  There is no evidence of focal airspace disease, pulmonary edema, suspicious pulmonary nodule/mass, pleural effusion, or pneumothorax. No acute bony abnormalities are identified.  IMPRESSION: No active disease.   Electronically Signed   By: Hassan Rowan M.D.   On: 03/14/2014 19:31    Medications: Scheduled Meds: . antiseptic oral rinse  15 mL Mouth Rinse BID  . benzonatate  200 mg Oral TID  . cholecalciferol  2,000 Units Oral Daily  . docusate sodium  100 mg Oral BID  . doxazosin  2 mg Oral Daily  . levofloxacin  500 mg Oral Daily  . losartan  50 mg Oral Daily  . Rivaroxaban  15 mg Oral QPC supper      LOS: 5 days   Ripudeep Krystal Eaton M.D. [facility] 03/19/2014, 1:03 PM Pager: [phone]  If 7PM-7AM, please contact night-coverage [email or web address] Password [email or web address]  **Disclaimer: This note was dictated with voice recognition software. Similar sounding words can inadvertently be transcribed and  this note may contain transcription errors which may not have been corrected upon publication of note.**

## 2014-03-19 NOTE — Progress Notes (Signed)
Speech Language Pathology Treatment: Dysphagia  Patient Details Name: Nicholas Hood MRN: [id number] DOB: 10/27/1915 Today's Date: 03/19/2014 Time: 0857-0905 SLP Time Calculation (min): 8 min  Assessment / Plan / Recommendation Clinical Impression  Pt. observed with portion of breakfast meal with son present and feeding pt. (pt. Wearing mitts).  Son educated on increased safety for self feeding (son states arms are weak).  Pt. Required min verbal/visual cues to remove pocketed eggs.  Wet vocal quality x 1.  Discussed possible upgrade in solid consistency and son stated he "likes having it chopped but still recognizable".  No further ST needed in acute care, however, recommend follow up ST at SNF.   HPI HPI: 80 yo male who lives at SNF who was being treated for UTI, came to ED 03/14/14 with son due to increased confusion, cough, and fever of 102. Pt has had several CXR since admission, all with no overt signs of PNA.   Pertinent Vitals WDL  SLP Plan  All goals met    Recommendations Diet recommendations: Dysphagia 2 (fine chop);Thin liquid Liquids provided via: Cup;Straw Medication Administration: Whole meds with puree Supervision: Staff to assist with self feeding;Full supervision/cueing for compensatory strategies Compensations: Slow rate;Small sips/bites;Check for pocketing Postural Changes and/or Swallow Maneuvers: Seated upright 90 degrees              Oral Care Recommendations: Oral care BID Follow up Recommendations: Skilled Nursing facility Plan: All goals met    GO      Willis  M.Ed CCC-SLP Pager [phone]  03/19/2014   

## 2014-03-19 NOTE — Care Management Note (Signed)
    Page 1 of 1   03/20/2014     12:01:58 PM CARE MANAGEMENT NOTE 03/20/2014  Patient:  Nicholas Hood, Nicholas Hood   Account Number:  [id number]  Date Initiated:  03/19/2014  Documentation initiated by:  Tomi Bamberger  Subjective/Objective Assessment:   dx ams  admit- from [facility].     Action/Plan:   Anticipated DC Date:  03/20/2014   Anticipated DC Plan:  SKILLED NURSING FACILITY  In-house referral  Clinical Social Worker      DC Planning Services  CM consult      Choice offered to / List presented to:             Status of service:  Completed, signed off Medicare Important Message given?  YES (If response is "NO", the following Medicare IM given date fields will be blank) Date Medicare IM given:  03/17/2014 Date Additional Medicare IM given:  03/20/2014  Discharge Disposition:  SKILLED NURSING FACILITY  Per UR Regulation:  Reviewed for med. necessity/level of care/duration of stay  If discussed at [place] of Stay Meetings, dates discussed:    Comments:

## 2014-03-20 ENCOUNTER — Encounter: Payer: Self-pay | Admitting: Internal Medicine

## 2014-03-20 DIAGNOSIS — R269 Unspecified abnormalities of gait and mobility: Secondary | ICD-10-CM

## 2014-03-20 DIAGNOSIS — G319 Degenerative disease of nervous system, unspecified: Secondary | ICD-10-CM

## 2014-03-20 DIAGNOSIS — I679 Cerebrovascular disease, unspecified: Secondary | ICD-10-CM

## 2014-03-20 HISTORY — DX: Degenerative disease of nervous system, unspecified: G31.9

## 2014-03-20 HISTORY — DX: Cerebrovascular disease, unspecified: I67.9

## 2014-03-20 LAB — CULTURE, BLOOD (ROUTINE X 2)

## 2014-03-20 MED ORDER — LEVOFLOXACIN 500 MG PO TABS: 500.0000 mg | ORAL_TABLET | Freq: Every day | ORAL | Status: DC

## 2014-03-20 MED ORDER — ALBUTEROL SULFATE (2.5 MG/3ML) 0.083% IN NEBU: 2.5000 mg | INHALATION_SOLUTION | RESPIRATORY_TRACT | Status: DC | PRN

## 2014-03-20 MED ORDER — ALBUTEROL SULFATE (2.5 MG/3ML) 0.083% IN NEBU
2.5000 mg | INHALATION_SOLUTION | Freq: Once | RESPIRATORY_TRACT | Status: AC
  Administered 2014-03-20: 2.5 mg via RESPIRATORY_TRACT
  Filled 2014-03-20: qty 3

## 2014-03-20 MED ORDER — BENZONATATE 200 MG PO CAPS: 200.0000 mg | ORAL_CAPSULE | Freq: Three times a day (TID) | ORAL | Status: DC

## 2014-03-20 MED ORDER — DSS 100 MG PO CAPS
100.0000 mg | ORAL_CAPSULE | Freq: Two times a day (BID) | ORAL | Status: DC
Start: 2014-03-20 — End: 2014-12-04

## 2014-03-20 NOTE — Progress Notes (Signed)
NURSING PROGRESS NOTE  Nicholas Hood [id number] Discharge Data: 03/20/2014 11:34 AM Attending Provider: Mendel Corning, MD IFO:[id number], [name], MD     [name] to be D/C'd SNF per MD order.  Discussed with the patient the After Visit Summary and all questions fully answered. All IV's discontinued with no bleeding noted. All belongings returned to patient for patient to take home.   Last Vital Signs:  Blood pressure 138/83, pulse 70, temperature 97.8 F (36.6 C), temperature source Oral, resp. rate 20, height 5\' 11"  (1.803 m), weight 99.973 kg (220 lb 6.4 oz), SpO2 94.00%.  Discharge Medication List   Medication List    STOP taking these medications       DIOCTO 150 MG/15ML syrup  Generic drug:  Docusate Sodium  Replaced by:  DSS 100 MG Caps      TAKE these medications       acetaminophen 325 MG tablet  Commonly known as:  TYLENOL  Take 650 mg by mouth every 4 (four) hours as needed for fever (temp over 100).     albuterol (2.5 MG/3ML) 0.083% nebulizer solution  Commonly known as:  PROVENTIL  Take 3 mLs (2.5 mg total) by nebulization every 4 (four) hours as needed for wheezing or shortness of breath.     benzonatate 200 MG capsule  Commonly known as:  TESSALON  Take 1 capsule (200 mg total) by mouth 3 (three) times daily. For cough     cholecalciferol 1000 UNITS tablet  Commonly known as:  VITAMIN D  Take 2,000 Units by mouth daily.     doxazosin 2 MG tablet  Commonly known as:  CARDURA  Take 2 mg by mouth daily.     DSS 100 MG Caps  Take 100 mg by mouth 2 (two) times daily.     Glucosamine-Chondroitin 500-400 MG Caps  Take 3 capsules by mouth daily.     levofloxacin 500 MG tablet  Commonly known as:  LEVAQUIN  Take 1 tablet (500 mg total) by mouth daily. For 10 more days     losartan 50 MG tablet  Commonly known as:  COZAAR  Take 50 mg by mouth daily. For Hypertention     OYSTER SHELL CALCIUM + D 500-400 MG-UNIT Tabs  Generic drug:  Calcium  Carb-Cholecalciferol  Take 1 tablet by mouth daily.     Rivaroxaban 15 MG Tabs tablet  Commonly known as:  XARELTO  Take 15 mg by mouth daily after supper.     ZYRTEC ALLERGY 10 MG tablet  Generic drug:  cetirizine  Take 10 mg by mouth daily. To help with itching

## 2014-03-20 NOTE — Discharge Summary (Signed)
Physician Discharge Summary  Patient ID: Nicholas Hood MRN: [id number] DOB/AGE: 1916/06/13 80 y.o.  Admit date: 03/14/2014 Discharge date: 03/20/2014  Primary Care Physician:  Nicholas Dooms, MD  Discharge Diagnoses:    . Delirium, acute resolved  . gram-negative rods sepsis  . UTI urinary tract infection  . Pacemaker-St.Jude . Essential hypertension, benign . Anemia of chronic disease . Atrial fibrillation   Consults:  None  Discharge diet: dysphagia 2 diet, thin liquids    Allergies:   Allergies  Allergen Reactions  . Accupril [Quinapril Hcl]     Per MAR  . Alacepril     Per MAR  . Altace [Ramipril]     Unknown reaction   . Hctz [Hydrochlorothiazide]     Unknown reaction  . Inderal [Propranolol]     Per MAR  . Mobic [Meloxicam]     Per MAR  . Monopril [Fosinopril]     Per MAR  . Quinapril Hcl     Unknown reaction  . Reserpine     Per MAR     Discharge Medications:   Medication List    STOP taking these medications       DIOCTO 150 MG/15ML syrup  Generic drug:  Docusate Sodium  Replaced by:  DSS 100 MG Caps      TAKE these medications       acetaminophen 325 MG tablet  Commonly known as:  TYLENOL  Take 650 mg by mouth every 4 (four) hours as needed for fever (temp over 100).     albuterol (2.5 MG/3ML) 0.083% nebulizer solution  Commonly known as:  PROVENTIL  Take 3 mLs (2.5 mg total) by nebulization every 4 (four) hours as needed for wheezing or shortness of breath.     benzonatate 200 MG capsule  Commonly known as:  TESSALON  Take 1 capsule (200 mg total) by mouth 3 (three) times daily. For cough     cholecalciferol 1000 UNITS tablet  Commonly known as:  VITAMIN D  Take 2,000 Units by mouth daily.     doxazosin 2 MG tablet  Commonly known as:  CARDURA  Take 2 mg by mouth daily.     DSS 100 MG Caps  Take 100 mg by mouth 2 (two) times daily.     Glucosamine-Chondroitin 500-400 MG Caps  Take 3 capsules by mouth daily.      levofloxacin 500 MG tablet  Commonly known as:  LEVAQUIN  Take 1 tablet (500 mg total) by mouth daily. For 10 more days     losartan 50 MG tablet  Commonly known as:  COZAAR  Take 50 mg by mouth daily. For Hypertention     OYSTER SHELL CALCIUM + D 500-400 MG-UNIT Tabs  Generic drug:  Calcium Carb-Cholecalciferol  Take 1 tablet by mouth daily.     Rivaroxaban 15 MG Tabs tablet  Commonly known as:  XARELTO  Take 15 mg by mouth daily after supper.     ZYRTEC ALLERGY 10 MG tablet  Generic drug:  cetirizine  Take 10 mg by mouth daily. To help with itching         Brief H and P: For complete details please refer to admission H and P, but in brief patient is a 80 year old male, currently in skilled nursing facility was started on Levaquin 2 days prior to  admission for UTI. Urine culture grew contaminate, however patient got more confused and spiked a fever of 102. Patient was also coughing for several days with  no rashes, nausea vomiting or diarrhea.   he was brought to the ER for further workup  Hospital Course:  Patient is a pleasant 80 year old gentleman with a past medical history of prostate cancer, hypertension, peripheral neuropathy, nursing home resident who presented to the emergency room as a transfer from his facility on 03/14/2014 noted by nursing home staff to have a steep functional decline along with temperature of 102. It appears that he was diagnosed with a urinary tract infection 2 days prior to admission and started on Levaquin therapy. Initial lab work revealed a white count of 17,200. On admission he was started on IV Zosyn and vancomycin. Blood cultures drawn on 03/14/2014 growing gram-negative rods 1/2 cultures, suspected source of infection to be urinary tract infection. Vancomycin was discontinued as he was maintained on Zosyn therapy. Patient showing overall clinical improvement as lab work has revealed a downward trend in his white count  Gram-negative rods sepsis:  Likely from UTI  - Present on admission, evidenced by white count of 17,200, temperature 102 per nurse report, respiratory of 34, positive blood culture  -Source of infection likely to be urinary tract, patient was on Levaquin for 2 days prior to admission, urine culture hence neg on admission. Chest x-ray negative for pneumonia  -Blood cultures now growing a gram negative rod in 1/2 cultures 5/25. I spoke with [employer], they reported that patient has gram-negative rods in blood but insufficient growth so far for susceptibilities. As patient was on Levaquin prior to admission, likely that caused his urine culture during hospitalization to be negative and insufficient growth on the blood cultures drawn on admission. Zosyn was discontinued and patient was placed on Levaquin total of 2 weeks for gram-negative sepsis. Repeat blood cultures has been negative.   Coughing  - Chest x-ray negative, possibility of bronchitis, aspiration pneumonia, patient was given Lasix 20 mg IV x1 on admission. Speech therapy was consulted, patient does have some dysphagia, placed on dysphagia 2 diet, thin liquids. Continue levaquin albuterol nebs, incentive spirometry.    Metabolic encephalopathy: Likely due to #1, patient's son at the bedside, mental status close to baseline   Hypertension: Currently stable     Day of Discharge BP 138/83  Pulse 70  Temp(Src) 97.8 F (36.6 C) (Oral)  Resp 20  Ht 5\' 11"  (1.803 m)  Wt 99.973 kg (220 lb 6.4 oz)  BMI 30.75 kg/m2  SpO2 95%  Physical Exam: General: Alert and awake oriented, not in any acute distress. HEENT: anicteric sclera, pupils reactive to light and accommodation CVS: S1-S2 clear no murmur rubs or gallops Chest: clear to auscultation, poor effort  Abdomen: soft nontender, nondistended, normal bowel sounds Extremities: no cyanosis, clubbing, trace edema noted bilaterally Neuro: Cranial nerves II-XII intact, no focal neurological deficits   The  results of significant diagnostics from this hospitalization (including imaging, microbiology, ancillary and laboratory) are listed below for reference.    LAB RESULTS: Basic Metabolic Panel:  Recent Labs Lab 03/17/14 0627 03/19/14 0613  NA 139 138  K 4.2 4.3  CL 107 105  CO2 23 24  GLUCOSE 127* 108*  BUN 26* 22  CREATININE 0.97 0.94  CALCIUM 8.4 8.6   Liver Function Tests:  Recent Labs Lab 03/14/14 1800  AST 21  ALT 17  ALKPHOS 59  BILITOT 0.3  PROT 7.1  ALBUMIN 2.3*   No results found for this basename: LIPASE, AMYLASE,  in the last 168 hours No results found for this basename: AMMONIA,  in the  last 168 hours CBC:  Recent Labs Lab 03/14/14 1800  03/17/14 0627 03/19/14 0613  WBC 13.3*  < > 10.9* 11.6*  NEUTROABS 11.2*  --   --   --   HGB 9.8*  < > 11.0* 10.5*  HCT 30.5*  < > 34.1* 31.6*  MCV 93.6  < > 94.2 93.8  PLT 207  < > 244 269  < > = values in this interval not displayed. Cardiac Enzymes: No results found for this basename: CKTOTAL, CKMB, CKMBINDEX, TROPONINI,  in the last 168 hours BNP: No components found with this basename: POCBNP,  CBG: No results found for this basename: GLUCAP,  in the last 168 hours  Significant Diagnostic Studies:  Ct Head Wo Contrast  03/14/2014   CLINICAL DATA:  Fever.  Altered mental status.  EXAM: CT HEAD WITHOUT CONTRAST  TECHNIQUE: Contiguous axial images were obtained from the base of the skull through the vertex without intravenous contrast.  COMPARISON:  None.  FINDINGS: No mass lesion. No midline shift. No acute hemorrhage or hematoma. No extra-axial fluid collections. No evidence of acute infarction. There is diffuse cerebral cortical and cerebellar atrophy. Minimal periventricular white matter lucency consistent with slight chronic small vessel ischemic disease. No significant osseous abnormalities. Tiny retention cysts in both maxillary sinuses.  There is slight midline shift which is not felt to be significant. There  are no abnormal extra-axial fluid collections.  IMPRESSION: No acute abnormalities. Atrophy. Slight chronic small vessel ischemic disease.   Electronically Signed   By: Rozetta Nunnery M.D.   On: 03/14/2014 20:50   Dg Chest Port 1 View  03/14/2014   CLINICAL DATA:  80 year old male with fever and cough.  EXAM: PORTABLE CHEST - 1 VIEW  COMPARISON:  01/06/2010  FINDINGS: Mild cardiomegaly and left-sided pacemaker again noted.  There is no evidence of focal airspace disease, pulmonary edema, suspicious pulmonary nodule/mass, pleural effusion, or pneumothorax. No acute bony abnormalities are identified.  IMPRESSION: No active disease.   Electronically Signed   By: Hassan Rowan M.D.   On: 03/14/2014 19:31       Disposition and Follow-up:     Discharge Instructions   Increase activity slowly    Complete by:  As directed             DISPOSITION: Skilled nursing facility DIET: Dysphagia 2, thin liquids   DISCHARGE FOLLOW-UP Follow-up Information   Follow up with GREEN, [name], MD In 1 week. (for hospital follow-up)    Specialty:  Internal Medicine   Contact information:   [address] [place] [address] [phone]       Time spent on Discharge: 40 mins   Signed:   Mendel Corning M.D. [facility] 03/20/2014, 10:30 AM Pager: [phone]   **Disclaimer: This note was dictated with voice recognition software. Similar sounding words can inadvertently be transcribed and this note may contain transcription errors which may not have been corrected upon publication of note.**

## 2014-03-20 NOTE — Clinical Social Work Note (Signed)
Per MD patient ready to return to [place], patient's son, and facility notified of DC. RN given number for report. DC packet on chart. Ambulance transport requested for 2:00pm    Liz Beach MSW, Latanya Presser [place], [phone]

## 2014-03-21 LAB — CULTURE, BLOOD (ROUTINE X 2): CULTURE: NO GROWTH

## 2014-03-22 LAB — CULTURE, BLOOD (ROUTINE X 2)
CULTURE: NO GROWTH
Culture: NO GROWTH

## 2014-03-23 ENCOUNTER — Telehealth: Payer: Self-pay | Admitting: Cardiology

## 2014-03-23 ENCOUNTER — Encounter: Payer: Medicare Other | Admitting: *Deleted

## 2014-03-23 NOTE — Telephone Encounter (Signed)
Attempted to call pt x1 to remind pt of remote transmission.

## 2014-03-24 ENCOUNTER — Non-Acute Institutional Stay (SKILLED_NURSING_FACILITY): Payer: Medicare Other | Admitting: Internal Medicine

## 2014-03-24 ENCOUNTER — Encounter: Payer: Self-pay | Admitting: Cardiology

## 2014-03-24 ENCOUNTER — Encounter: Payer: Self-pay | Admitting: Nurse Practitioner

## 2014-03-24 DIAGNOSIS — R404 Transient alteration of awareness: Secondary | ICD-10-CM

## 2014-03-24 DIAGNOSIS — I10 Essential (primary) hypertension: Secondary | ICD-10-CM

## 2014-03-24 DIAGNOSIS — R410 Disorientation, unspecified: Secondary | ICD-10-CM

## 2014-03-24 DIAGNOSIS — R4182 Altered mental status, unspecified: Secondary | ICD-10-CM

## 2014-03-24 DIAGNOSIS — I679 Cerebrovascular disease, unspecified: Secondary | ICD-10-CM

## 2014-03-24 DIAGNOSIS — G319 Degenerative disease of nervous system, unspecified: Secondary | ICD-10-CM

## 2014-03-24 NOTE — Progress Notes (Signed)
This encounter was created in error - please disregard.

## 2014-03-27 ENCOUNTER — Encounter: Payer: Self-pay | Admitting: Nurse Practitioner

## 2014-03-27 ENCOUNTER — Non-Acute Institutional Stay (SKILLED_NURSING_FACILITY): Payer: Medicare Other | Admitting: Nurse Practitioner

## 2014-03-27 DIAGNOSIS — K5900 Constipation, unspecified: Secondary | ICD-10-CM

## 2014-03-27 DIAGNOSIS — I10 Essential (primary) hypertension: Secondary | ICD-10-CM

## 2014-03-27 DIAGNOSIS — R059 Cough, unspecified: Secondary | ICD-10-CM

## 2014-03-27 DIAGNOSIS — D72829 Elevated white blood cell count, unspecified: Secondary | ICD-10-CM

## 2014-03-27 DIAGNOSIS — N390 Urinary tract infection, site not specified: Secondary | ICD-10-CM

## 2014-03-27 DIAGNOSIS — R05 Cough: Secondary | ICD-10-CM

## 2014-03-27 DIAGNOSIS — F4323 Adjustment disorder with mixed anxiety and depressed mood: Secondary | ICD-10-CM | POA: Insufficient documentation

## 2014-03-27 DIAGNOSIS — L299 Pruritus, unspecified: Secondary | ICD-10-CM

## 2014-03-27 DIAGNOSIS — I4891 Unspecified atrial fibrillation: Secondary | ICD-10-CM

## 2014-03-27 LAB — HEPATIC FUNCTION PANEL
ALT: 11 U/L (ref 10–40)
AST: 14 U/L (ref 14–40)
Alkaline Phosphatase: 42 U/L (ref 25–125)
Bilirubin, Total: 0.3 mg/dL

## 2014-03-27 LAB — BASIC METABOLIC PANEL
BUN: 25 mg/dL — AB (ref 4–21)
Creatinine: 1.1 mg/dL (ref 0.6–1.3)
Glucose: 113 mg/dL
POTASSIUM: 4.4 mmol/L (ref 3.4–5.3)
Sodium: 138 mmol/L (ref 137–147)

## 2014-03-27 LAB — CBC AND DIFFERENTIAL
HCT: 34 % — AB (ref 41–53)
HEMOGLOBIN: 11.6 g/dL — AB (ref 13.5–17.5)
Platelets: 318 10*3/uL (ref 150–399)
WBC: 12.1 10*3/mL

## 2014-03-27 NOTE — Assessment & Plan Note (Signed)
Recent hospitalization and possible side effect of Levaquin contribute to his anxious mood-Lorazepam 0.5mg  bid and prn for now until total 10 day course Levaquin is completed. Will consider Lexapro is no better. Observe.

## 2014-03-27 NOTE — Progress Notes (Signed)
Patient ID: Nicholas Hood, male   DOB: 1916-06-24, 80 y.o.   MRN: [id number]     Allergies  Allergen Reactions  . Accupril [Quinapril Hcl]     Per MAR  . Alacepril     Per MAR  . Altace [Ramipril]     Unknown reaction   . Hctz [Hydrochlorothiazide]     Unknown reaction  . Inderal [Propranolol]     Per MAR  . Mobic [Meloxicam]     Per MAR  . Monopril [Fosinopril]     Per MAR  . Quinapril Hcl     Unknown reaction  . Reserpine     Per [facility]    Chief Complaint  Patient presents with  . Medical Management of Chronic Issues  . Acute Visit    anxiety    HPI: Patient is a 80 y.o. male seen in the SNF at [facility] today for anxiety and other chronic medical conditions.     Hospitalized 03/14/2014-03/20/2014 for Delirium-acute resolved, gram-negative rods sepsis UTI urinary tract infection-complete 10 day course of Levaquin SNF.   Problem List Items Addressed This Visit   Urinary tract infection, site not specified     Present on hospital admission, evidenced by white count of 17,200, temperature 102 per nurse report, respiratory of 34, positive blood culture  -Source of infection likely to be urinary tract, patient was on Levaquin for 2 days prior to hospital admission, urine culture hence neg on admission. Chest x-ray negative for pneumonia  -Blood cultures now growing a gram negative rod in 1/2 cultures 5/25. As patient was on Levaquin prior to admission, likely that caused his urine culture during hospitalization to be negative and insufficient growth on the blood cultures drawn on hospital admission. Zosyn was discontinued and patient was placed on Levaquin total of 2 weeks for gram-negative sepsis. Repeat blood cultures has been negative.  Repeat UA C/S pending.      Unspecified constipation     Stable, takes Colace bid.     Pruritus     chronic itching-not apparent. Will change Zyrtec to prn.      Leukocytosis, unspecified     Wbc trending down from 17s to 12.1  03/27/14 @ [facility]    Essential hypertension, benign     Controlled on Cardura 2mg , Losartan 50mg ,      Cough     Not apparent. Change Benzonatate to tid prn. In hospital: Chest x-ray negative, possibility of bronchitis, aspiration pneumonia, patient was given Lasix 20 mg IV x1 on hospital  admission. Speech therapy was consulted, patient does have some dysphagia, placed on dysphagia 2 diet, thin liquids. Continue levaquin albuterol nebs prn incentive spirometry      Atrial fibrillation     Rate controlled. Takes Xarelto for thromboembolic events risk reduction.      Adjustment disorder with mixed anxiety and depressed mood - Primary     Recent hospitalization and possible side effect of Levaquin contribute to his anxious mood-Lorazepam 0.5mg  bid and prn for now until total 10 day course Levaquin is completed. Will consider Lexapro is no better. Observe.        Review of Systems:  Review of Systems  Constitutional: Positive for malaise/fatigue. Negative for fever, chills, weight loss and diaphoresis.  HENT: Positive for hearing loss. Negative for congestion, ear discharge, ear pain, nosebleeds and tinnitus.   Eyes: Negative for blurred vision, double vision, photophobia, pain and discharge.  Respiratory: Negative for cough, hemoptysis, sputum production, shortness of breath,  wheezing and stridor.   Cardiovascular: Negative for chest pain, palpitations, orthopnea, claudication, leg swelling and PND.  Gastrointestinal: Positive for constipation. Negative for heartburn, nausea, vomiting, abdominal pain, diarrhea and blood in stool.  Genitourinary: Positive for frequency. Negative for dysuria, urgency, hematuria and flank pain.  Musculoskeletal: Negative for back pain, falls, joint pain, myalgias and neck pain.  Skin: Positive for itching.       Hx of chronic itching.   Neurological: Positive for tremors. Negative for dizziness, tingling, sensory change, speech change, focal weakness,  seizures, loss of consciousness, weakness and headaches.       Noted resting tremor in the right fingers  Endo/Heme/Allergies: Positive for environmental allergies. Negative for polydipsia. Bruises/bleeds easily.  Psychiatric/Behavioral: Positive for memory loss. Negative for depression, suicidal ideas, hallucinations and substance abuse. The patient is nervous/anxious. The patient does not have insomnia.        Repetitive. Increased confusion     Past Medical History  Diagnosis Date  . Hypertension   . Prostate cancer   . Spinal stenosis   . Peripheral neuropathy   . Diverticulosis   . Complete heart block     s/p PPM  . Vitamin D deficiency   . Pacemaker     PPM- St Jude  . Paroxysmal atrial fibrillation   . Contusion of face, scalp, and neck except eye(s) 04/23/2012  . Abnormal weight gain 04/12/2012  . Fall from other slipping, tripping, or stumbling 04/08/2012  . Congestive heart failure, unspecified 03/05/2012  . Shortness of breath 03/05/2012  . Unspecified hypertrophic and atrophic condition of skin 01/16/2012  . Sebaceous cyst 01/16/2012  . Unspecified constipation 11/28/2011  . Edema 04/12/2010  . Other specified cardiac dysrhythmias(427.89) 01/03/2010  . Other premature beats 06/02/2008  . Other malaise and fatigue 01/28/2008  . Nonspecific abnormal electrocardiogram (ECG) (EKG) 01/28/2008  . Internal hemorrhoids without mention of complication 3/89/3734  . Unspecified hearing loss 02/19/2007  . Osteoarthrosis, unspecified whether generalized or localized, unspecified site 02/19/2007  . Left bundle branch hemiblock 06/04/1999  . Abnormality of gait 06/04/1999  . Aortic valve disorders 06/04/1979  . Cerebral atrophy 03/20/14  . Cerebellar atrophy 03/20/14  . Cerebrovascular disease 03/20/14    small vessell disease  . Urinary tract infection, site not specified 03/14/14  . Delirium 03/14/14   Past Surgical History  Procedure Laterality Date  . Pacemaker insertion      PPM- St  Jude-3/11  . Back surgery  2000    Spinal stenosis Dr. Achilles Dunk  . Nasal polyp excision    . Tonsillectomy    . Cataract extraction w/ intraocular lens  implant, bilateral  10/2009    Dr. Ellie Lunch  . Pacemaker placement  01/05/2010  . Colonoscopy  07/25/2007    int hem. diverticulosis  Dr. Lajoyce Corners   Social History:   reports that he quit smoking about 76 years ago. He has never used smokeless tobacco. He reports that he does not drink alcohol or use illicit drugs.  Family History  Problem Relation Age of Onset  . Coronary artery disease      family history  . Stroke Mother   . Heart disease Father   . Heart disease Brother   . Heart disease Brother   . Heart disease Brother   . Lung disease Brother     Medications: Patient's Medications  New Prescriptions   No medications on file  Previous Medications   ACETAMINOPHEN (TYLENOL) 325 MG TABLET    Take 650 mg  by mouth every 4 (four) hours as needed for fever (temp over 100).   ALBUTEROL (PROVENTIL) (2.5 MG/3ML) 0.083% NEBULIZER SOLUTION    Take 3 mLs (2.5 mg total) by nebulization every 4 (four) hours as needed for wheezing or shortness of breath.   BENZONATATE (TESSALON) 200 MG CAPSULE    Take 1 capsule (200 mg total) by mouth 3 (three) times daily. For cough   CALCIUM CARB-CHOLECALCIFEROL (OYSTER SHELL CALCIUM + D) 500-400 MG-UNIT TABS    Take 1 tablet by mouth daily.   CETIRIZINE (ZYRTEC ALLERGY) 10 MG TABLET    Take 10 mg by mouth daily. To help with itching   CHOLECALCIFEROL (VITAMIN D) 1000 UNITS TABLET    Take 2,000 Units by mouth daily.   DOCUSATE SODIUM 100 MG CAPS    Take 100 mg by mouth 2 (two) times daily.   DOXAZOSIN (CARDURA) 2 MG TABLET    Take 2 mg by mouth daily.   GLUCOSAMINE-CHONDROITIN 500-400 MG CAPS    Take 3 capsules by mouth daily.   LEVOFLOXACIN (LEVAQUIN) 500 MG TABLET    Take 1 tablet (500 mg total) by mouth daily. For 10 more days   LOSARTAN (COZAAR) 50 MG TABLET    Take 50 mg by mouth daily. For Hypertention    RIVAROXABAN (XARELTO) 15 MG TABS TABLET    Take 15 mg by mouth daily after supper.  Modified Medications   No medications on file  Discontinued Medications   No medications on file     Physical Exam: Physical Exam  Constitutional: He is oriented to person, place, and time. He appears well-developed and well-nourished. No distress.  HENT:  Head: Normocephalic and atraumatic.  Right Ear: External ear normal.  Left Ear: External ear normal.  Nose: Nose normal.  Mouth/Throat: Oropharynx is clear and moist. No oropharyngeal exudate.  Eyes: Conjunctivae and EOM are normal. Pupils are equal, round, and reactive to light. Right eye exhibits no discharge. Left eye exhibits no discharge. No scleral icterus.  Neck: Normal range of motion. Neck supple. No JVD present. No tracheal deviation present. No thyromegaly present.  Cardiovascular: Normal rate, normal heart sounds and intact distal pulses.   No murmur heard. A-fib  Pulmonary/Chest: Effort normal and breath sounds normal. No stridor. No respiratory distress. He has no wheezes. He has no rales. He exhibits no tenderness.  Abdominal: Soft. Bowel sounds are normal. He exhibits no distension. There is no tenderness. There is no rebound and no guarding.  Musculoskeletal: Normal range of motion. He exhibits no edema and no tenderness.  Motor scooter for mobility.   Lymphadenopathy:    He has no cervical adenopathy.  Neurological: He is alert and oriented to person, place, and time. He has normal reflexes. No cranial nerve deficit. He exhibits abnormal muscle tone. Coordination normal.  Skin: Skin is dry. No rash noted. He is not diaphoretic. No erythema. No pallor.  Scratchy marks right arm and right neck.   Psychiatric: He has a normal mood and affect. His behavior is normal. Judgment and thought content normal.  Repetitive. Confusion. Anxious.     Filed Vitals:   03/27/14 1426  BP: 120/86  Pulse: 74  Temp: 97.9 F (36.6 C)  TempSrc:  Tympanic  Resp: 20  SpO2: 93%      Labs reviewed: Basic Metabolic Panel:  Recent Labs  07/08/13 03/13/14  03/16/14 0441 03/17/14 0627 03/19/14 0613 03/27/14  NA 141 138  < > 140 139 138 138  K 3.9 4.3  < >  4.2 4.2 4.3 4.4  CL  --   --   < > 107 107 105  --   CO2  --   --   < > 22 23 24   --   GLUCOSE  --   --   < > 105* 127* 108*  --   BUN 25* 29*  < > 22 26* 22 25*  CREATININE 1.2 1.0  < > 0.98 0.97 0.94 1.1  CALCIUM  --   --   < > 8.5 8.4 8.6  --   TSH  --  1.08  --   --   --   --   --   < > = values in this interval not displayed. Liver Function Tests:  Recent Labs  03/13/14 03/14/14 1800 03/27/14  AST 32 21 14  ALT 18 17 11   ALKPHOS 49 59 42  BILITOT  --  0.3  --   PROT  --  7.1  --   ALBUMIN  --  2.3*  --    CBC:  Recent Labs  03/14/14 1800  03/16/14 0441 03/17/14 0627 03/19/14 0613 03/27/14  WBC 13.3*  < > 11.1* 10.9* 11.6* 12.1  NEUTROABS 11.2*  --   --   --   --   --   HGB 9.8*  < > 10.6* 11.0* 10.5* 11.6*  HCT 30.5*  < > 31.7* 34.1* 31.6* 34*  MCV 93.6  < > 93.5 94.2 93.8  --   PLT 207  < > 248 244 269 318  < > = values in this interval not displayed. Assessment/Plan Adjustment disorder with mixed anxiety and depressed mood Recent hospitalization and possible side effect of Levaquin contribute to his anxious mood-Lorazepam 0.5mg  bid and prn for now until total 10 day course Levaquin is completed. Will consider Lexapro is no better. Observe.   Essential hypertension, benign Controlled on Cardura 2mg , Losartan 50mg ,    Atrial fibrillation Rate controlled. Takes Xarelto for thromboembolic events risk reduction.    Unspecified constipation Stable, takes Colace bid.   Leukocytosis, unspecified Wbc trending down from 17s to 12.1 03/27/14 @ [facility]  Pruritus chronic itching-not apparent. Will change Zyrtec to prn.    Cough Not apparent. Change Benzonatate to tid prn. In hospital: Chest x-ray negative, possibility of bronchitis, aspiration  pneumonia, patient was given Lasix 20 mg IV x1 on hospital  admission. Speech therapy was consulted, patient does have some dysphagia, placed on dysphagia 2 diet, thin liquids. Continue levaquin albuterol nebs prn incentive spirometry    Urinary tract infection, site not specified Present on hospital admission, evidenced by white count of 17,200, temperature 102 per nurse report, respiratory of 34, positive blood culture  -Source of infection likely to be urinary tract, patient was on Levaquin for 2 days prior to hospital admission, urine culture hence neg on admission. Chest x-ray negative for pneumonia  -Blood cultures now growing a gram negative rod in 1/2 cultures 5/25. As patient was on Levaquin prior to admission, likely that caused his urine culture during hospitalization to be negative and insufficient growth on the blood cultures drawn on hospital admission. Zosyn was discontinued and patient was placed on Levaquin total of 2 weeks for gram-negative sepsis. Repeat blood cultures has been negative.  Repeat UA C/S pending.      Family/ Staff Communication: observe the patient.   Goals of Care: AL  Labs/tests ordered: CBC and CMP done 03/27/14

## 2014-03-27 NOTE — Assessment & Plan Note (Signed)
Controlled on Cardura 2mg , Losartan 50mg ,

## 2014-03-27 NOTE — Assessment & Plan Note (Signed)
Rate controlled. Takes Xarelto for thromboembolic events risk reduction.  

## 2014-03-31 ENCOUNTER — Encounter: Payer: Self-pay | Admitting: Nurse Practitioner

## 2014-03-31 DIAGNOSIS — R05 Cough: Secondary | ICD-10-CM | POA: Insufficient documentation

## 2014-03-31 DIAGNOSIS — R059 Cough, unspecified: Secondary | ICD-10-CM | POA: Insufficient documentation

## 2014-03-31 NOTE — Assessment & Plan Note (Signed)
chronic itching-not apparent. Will change Zyrtec to prn.

## 2014-03-31 NOTE — Assessment & Plan Note (Signed)
Present on hospital admission, evidenced by white count of 17,200, temperature 102 per nurse report, respiratory of 34, positive blood culture  -Source of infection likely to be urinary tract, patient was on Levaquin for 2 days prior to hospital admission, urine culture hence neg on admission. Chest x-ray negative for pneumonia  -Blood cultures now growing a gram negative rod in 1/2 cultures 5/25. As patient was on Levaquin prior to admission, likely that caused his urine culture during hospitalization to be negative and insufficient growth on the blood cultures drawn on hospital admission. Zosyn was discontinued and patient was placed on Levaquin total of 2 weeks for gram-negative sepsis. Repeat blood cultures has been negative.  Repeat UA C/S pending.

## 2014-03-31 NOTE — Assessment & Plan Note (Addendum)
Not apparent. Change Benzonatate to tid prn. In hospital: Chest x-ray negative, possibility of bronchitis, aspiration pneumonia, patient was given Lasix 20 mg IV x1 on hospital  admission. Speech therapy was consulted, patient does have some dysphagia, placed on dysphagia 2 diet, thin liquids. Continue levaquin albuterol nebs prn incentive spirometry

## 2014-03-31 NOTE — Assessment & Plan Note (Signed)
Stable, takes Colace bid.   

## 2014-03-31 NOTE — Assessment & Plan Note (Signed)
Wbc trending down from 17s to 12.1 03/27/14 @ [facility]

## 2014-04-03 ENCOUNTER — Other Ambulatory Visit: Payer: Self-pay | Admitting: Nurse Practitioner

## 2014-04-06 ENCOUNTER — Encounter: Payer: Self-pay | Admitting: Internal Medicine

## 2014-04-06 NOTE — Progress Notes (Signed)
Patient ID: Nicholas Hood, male   DOB: Apr 15, 1916, 80 y.o.   MRN: [id number]    Location:  [place] of Service: SNF [phone])  Extended Emergency Contact Information Primary Emergency Contact: Eagleton,Randall Address: [address], [place] Phone: [phone] Work Phone: [phone] Relation: Other   Chief Complaint  Patient presents with  . Hospitalization Follow-up    New admit to SNF following hospitalization    HPI:  Hospitalized 03/14/14 through 03/20/14. Patient had delirium upon entry to the hospital. He was found to have gram-negative rod sepsis related to urinary tract infection. Other problems include hypertension, anemia of chronic disease, atrial fibrillation, and dysphagia. He has multiple other chronic diagnoses as listed below.  Patient previously was a resident at [facility] level of care. He has deteriorated as a result of his recent illness. He is on SNF for rehabilitation. It is not clear if he will regain enough strength and self care skills to return to [place]. I suspect he will be here for LTC.  Past Medical History  Diagnosis Date  . Hypertension   . Prostate cancer   . Spinal stenosis   . Peripheral neuropathy   . Diverticulosis   . Complete heart block     s/p PPM  . Vitamin D deficiency   . Pacemaker     PPM- St Jude  . Paroxysmal atrial fibrillation   . Contusion of face, scalp, and neck except eye(s) 04/23/2012  . Abnormal weight gain 04/12/2012  . Fall from other slipping, tripping, or stumbling 04/08/2012  . Congestive heart failure, unspecified 03/05/2012  . Shortness of breath 03/05/2012  . Unspecified hypertrophic and atrophic condition of skin 01/16/2012  . Sebaceous cyst 01/16/2012  . Unspecified constipation 11/28/2011  . Edema 04/12/2010  . Other specified cardiac dysrhythmias(427.89) 01/03/2010  . Other premature beats 06/02/2008  . Other malaise and fatigue 01/28/2008  . Nonspecific abnormal electrocardiogram (ECG) (EKG)  01/28/2008  . Internal hemorrhoids without mention of complication 7/48/2707  . Unspecified hearing loss 02/19/2007  . Osteoarthrosis, unspecified whether generalized or localized, unspecified site 02/19/2007  . Left bundle branch hemiblock 06/04/1999  . Abnormality of gait 06/04/1999  . Aortic valve disorders 06/04/1979  . Cerebral atrophy 03/20/14  . Cerebellar atrophy 03/20/14  . Cerebrovascular disease 03/20/14    small vessell disease  . Urinary tract infection, site not specified 03/14/14  . Delirium 03/14/14  . Sepsis 03/14/14  . Anemia of chronic disease 2014  . Dysphagia 2015    Past Surgical History  Procedure Laterality Date  . Pacemaker insertion      PPM- St Jude-3/11  . Back surgery  2000    Spinal stenosis Dr. Achilles Dunk  . Nasal polyp excision    . Tonsillectomy    . Cataract extraction w/ intraocular lens  implant, bilateral  10/2009    Dr. Ellie Lunch  . Pacemaker placement  01/05/2010  . Colonoscopy  07/25/2007    int hem. diverticulosis  Dr. Lajoyce Corners    History   Social History  . Marital Status: Married    Spouse Name: N/A    Number of Children: N/A  . Years of Education: N/A   Occupational History  . marketing executive--retired     [employer]   Social History Main Topics  . Smoking status: Former Smoker -- 67 years    Quit date: 10/23/1937  . Smokeless tobacco: Never Used  . Alcohol Use:  No  . Drug Use: No  . Sexual Activity: No   Other Topics Concern  . Not on file   Social History Narrative   Exercises 3 times  A week at exercise class.     reports that he quit smoking about 76 years ago. He has never used smokeless tobacco. He reports that he does not drink alcohol or use illicit drugs.  Immunization History  Administered Date(s) Administered  . Influenza Whole 08/12/2012  . Pneumococcal Polysaccharide-23 10/23/2004  . Td 10/24/2007    Allergies  Allergen Reactions  . Accupril [Quinapril Hcl]     Per MAR  . Alacepril     Per MAR  .  Altace [Ramipril]     Unknown reaction   . Hctz [Hydrochlorothiazide]     Unknown reaction  . Inderal [Propranolol]     Per MAR  . Mobic [Meloxicam]     Per MAR  . Monopril [Fosinopril]     Per MAR  . Quinapril Hcl     Unknown reaction  . Reserpine     Per MAR    Medications: Patient's Medications  New Prescriptions   No medications on file  Previous Medications   ACETAMINOPHEN (TYLENOL) 325 MG TABLET    Take 650 mg by mouth every 4 (four) hours as needed for fever (temp over 100).   ALBUTEROL (PROVENTIL) (2.5 MG/3ML) 0.083% NEBULIZER SOLUTION    Take 3 mLs (2.5 mg total) by nebulization every 4 (four) hours as needed for wheezing or shortness of breath.   BENZONATATE (TESSALON) 200 MG CAPSULE    Take 1 capsule (200 mg total) by mouth 3 (three) times daily. For cough   CALCIUM CARB-CHOLECALCIFEROL (OYSTER SHELL CALCIUM + D) 500-400 MG-UNIT TABS    Take 1 tablet by mouth daily.   CETIRIZINE (ZYRTEC ALLERGY) 10 MG TABLET    Take 10 mg by mouth daily. To help with itching   CHOLECALCIFEROL (VITAMIN D) 1000 UNITS TABLET    Take 2,000 Units by mouth daily.   DOCUSATE SODIUM 100 MG CAPS    Take 100 mg by mouth 2 (two) times daily.   DOXAZOSIN (CARDURA) 2 MG TABLET    Take 2 mg by mouth daily.   GLUCOSAMINE-CHONDROITIN 500-400 MG CAPS    Take 3 capsules by mouth daily.   LEVOFLOXACIN (LEVAQUIN) 500 MG TABLET    Take 1 tablet (500 mg total) by mouth daily. For 10 more days   LOSARTAN (COZAAR) 50 MG TABLET    Take 50 mg by mouth daily. For Hypertention   RIVAROXABAN (XARELTO) 15 MG TABS TABLET    Take 15 mg by mouth daily after supper.  Modified Medications   No medications on file  Discontinued Medications   No medications on file     Review of Systems  Constitutional: Positive for activity change, appetite change and fatigue. Negative for fever.       Generalized weakness  HENT: Positive for hearing loss.   Eyes: Negative.   Respiratory: Negative for cough and shortness of  breath.   Cardiovascular: Negative for chest pain, palpitations and leg swelling.  Gastrointestinal: Negative.   Endocrine: Negative.   Genitourinary: Negative.   Musculoskeletal: Positive for arthralgias, back pain and gait problem.  Skin: Positive for rash.       Pruritus  Allergic/Immunologic: Negative.   Neurological: Positive for tremors and weakness.  Hematological: Negative.   Psychiatric/Behavioral: Negative.     Filed Vitals:   04/06/14 1232  BP: 140/86  Pulse: 74  Temp: 99.2 F (37.3 C)  Resp: 20  Height: 5' 11"  (1.803 m)  Weight: 222 lb (100.699 kg)   Body mass index is 30.98 kg/(m^2).  Physical Exam  Constitutional: He is oriented to person, place, and time. He appears well-nourished. No distress.  Generalized weakness.  HENT:  Head: Normocephalic and atraumatic.  Severe hearing loss  Eyes:  Corrective lenses  Neck: Neck supple. No JVD present. No tracheal deviation present. No thyromegaly present.  Cardiovascular: Normal rate.  Exam reveals no gallop and no friction rub.   AF  Pulmonary/Chest: Breath sounds normal. No respiratory distress. He has no wheezes. He has no rales.  Abdominal: Soft. Bowel sounds are normal. He exhibits no distension and no mass. There is no tenderness.  Musculoskeletal: Normal range of motion. He exhibits no edema and no tenderness.  Lymphadenopathy:    He has no cervical adenopathy.  Neurological: He is alert and oriented to person, place, and time. No cranial nerve deficit. Coordination abnormal.  Poorly responsive.I believe he is still delirious.  Skin:  Dry skin. Rash on both shoulders, arms, and upper back. Excoriations.  Psychiatric: He has a normal mood and affect. His behavior is normal. Judgment and thought content normal.     Labs reviewed: Admission on 03/14/2014, Discharged on 03/20/2014  Component Date Value Ref Range Status  . Specimen Description 03/14/2014 BLOOD RIGHT ARM   Final  . Special Requests  03/14/2014 BOTTLES DRAWN AEROBIC AND ANAEROBIC 5CC   Final  . Culture  Setup Time 03/14/2014    Final                   Value:03/15/2014 03:14                         Performed at [facility]  . Culture 03/14/2014    Final                   Value:GRAM NEGATIVE RODS                         Note: GROWTH INSUFFICIENT FOR ID AND SENSITIVITIES                         Note: Gram Stain Report Called to,Read Back By and Verified With: KATIE B BY INGRAM A 03/16/14 1025AM                         Performed at [facility]  . Report Status 03/14/2014 03/20/2014 FINAL   Final  . Specimen Description 03/14/2014 BLOOD RIGHT ARM   Final  . Special Requests 03/14/2014 BOTTLES DRAWN AEROBIC AND ANAEROBIC 10CC   Final  . Culture  Setup Time 03/14/2014    Final                   Value:03/15/2014 03:14                         Performed at [facility]  . Culture 03/14/2014    Final                   Value:NO GROWTH 5 DAYS                         Performed at  [facility]  . Report Status 03/14/2014 03/21/2014 FINAL   Final  . WBC 03/14/2014 13.3* 4.0 - 10.5 K/uL Final  . RBC 03/14/2014 3.26* 4.22 - 5.81 MIL/uL Final  . Hemoglobin 03/14/2014 9.8* 13.0 - 17.0 g/dL Final  . HCT 03/14/2014 30.5* 39.0 - 52.0 % Final  . MCV 03/14/2014 93.6  78.0 - 100.0 fL Final  . MCH 03/14/2014 30.1  26.0 - 34.0 pg Final  . MCHC 03/14/2014 32.1  30.0 - 36.0 g/dL Final  . RDW 03/14/2014 14.4  11.5 - 15.5 % Final  . Platelets 03/14/2014 207  150 - 400 K/uL Final  . Neutrophils Relative % 03/14/2014 83* 43 - 77 % Final  . Neutro Abs 03/14/2014 11.2* 1.7 - 7.7 K/uL Final  . Lymphocytes Relative 03/14/2014 6* 12 - 46 % Final  . Lymphs Abs 03/14/2014 0.7  0.7 - 4.0 K/uL Final  . Monocytes Relative 03/14/2014 9  3 - 12 % Final  . Monocytes Absolute 03/14/2014 1.2* 0.1 - 1.0 K/uL Final  . Eosinophils Relative 03/14/2014 2  0 - 5 % Final  . Eosinophils Absolute 03/14/2014 0.3  0.0 - 0.7 K/uL Final    . Basophils Relative 03/14/2014 0  0 - 1 % Final  . Basophils Absolute 03/14/2014 0.0  0.0 - 0.1 K/uL Final  . Sodium 03/14/2014 138  137 - 147 mEq/L Final  . Potassium 03/14/2014 4.2  3.7 - 5.3 mEq/L Final  . Chloride 03/14/2014 105  96 - 112 mEq/L Final  . CO2 03/14/2014 22  19 - 32 mEq/L Final  . Glucose, Bld 03/14/2014 98  70 - 99 mg/dL Final  . BUN 03/14/2014 28* 6 - 23 mg/dL Final  . Creatinine, Ser 03/14/2014 0.99  0.50 - 1.35 mg/dL Final  . Calcium 03/14/2014 8.5  8.4 - 10.5 mg/dL Final  . Total Protein 03/14/2014 7.1  6.0 - 8.3 g/dL Final  . Albumin 03/14/2014 2.3* 3.5 - 5.2 g/dL Final  . AST 03/14/2014 21  0 - 37 U/L Final  . ALT 03/14/2014 17  0 - 53 U/L Final  . Alkaline Phosphatase 03/14/2014 59  39 - 117 U/L Final  . Total Bilirubin 03/14/2014 0.3  0.3 - 1.2 mg/dL Final  . GFR calc non Af Amer 03/14/2014 67* >90 mL/min Final  . GFR calc Af Amer 03/14/2014 78* >90 mL/min Final   Comment: (NOTE)                          The eGFR has been calculated using the CKD EPI equation.                          This calculation has not been validated in all clinical situations.                          eGFR's persistently <90 mL/min signify possible Chronic Kidney                          Disease.  . Lactic Acid, Venous 03/14/2014 1.15  0.5 - 2.2 mmol/L Final  . Color, Urine 03/14/2014 YELLOW  YELLOW Final  . APPearance 03/14/2014 CLOUDY* CLEAR Final  . Specific Gravity, Urine 03/14/2014 1.018  1.005 - 1.030 Final  . pH 03/14/2014 5.0  5.0 - 8.0 Final  . Glucose, UA 03/14/2014 NEGATIVE  NEGATIVE mg/dL  Final  . Hgb urine dipstick 03/14/2014 LARGE* NEGATIVE Final  . Bilirubin Urine 03/14/2014 NEGATIVE  NEGATIVE Final  . Ketones, ur 03/14/2014 NEGATIVE  NEGATIVE mg/dL Final  . Protein, ur 03/14/2014 NEGATIVE  NEGATIVE mg/dL Final  . Urobilinogen, UA 03/14/2014 0.2  0.0 - 1.0 mg/dL Final  . Nitrite 03/14/2014 NEGATIVE  NEGATIVE Final  . Leukocytes, UA 03/14/2014 SMALL* NEGATIVE  Final  . Specimen Description 03/14/2014 URINE, CATHETERIZED   Final  . Special Requests 03/14/2014 NONE   Final  . Culture  Setup Time 03/14/2014    Final                   Value:03/15/2014 03:47                         Performed at [facility]  . Colony Count 03/14/2014    Final                   Value:NO GROWTH                         Performed at [facility]  . Culture 03/14/2014    Final                   Value:NO GROWTH                         Performed at [facility]  . Report Status 03/14/2014 03/16/2014 FINAL   Final  . WBC, UA 03/14/2014 3-6  <3 WBC/hpf Final  . RBC / HPF 03/14/2014 21-50  <3 RBC/hpf Final  . Sodium 03/15/2014 139  137 - 147 mEq/L Final  . Potassium 03/15/2014 4.2  3.7 - 5.3 mEq/L Final  . Chloride 03/15/2014 105  96 - 112 mEq/L Final  . CO2 03/15/2014 21  19 - 32 mEq/L Final  . Glucose, Bld 03/15/2014 106* 70 - 99 mg/dL Final  . BUN 03/15/2014 22  6 - 23 mg/dL Final  . Creatinine, Ser 03/15/2014 0.92  0.50 - 1.35 mg/dL Final  . Calcium 03/15/2014 8.5  8.4 - 10.5 mg/dL Final  . GFR calc non Af Amer 03/15/2014 69* >90 mL/min Final  . GFR calc Af Amer 03/15/2014 80* >90 mL/min Final   Comment: (NOTE)                          The eGFR has been calculated using the CKD EPI equation.                          This calculation has not been validated in all clinical situations.                          eGFR's persistently <90 mL/min signify possible Chronic Kidney                          Disease.  . WBC 03/15/2014 12.4* 4.0 - 10.5 K/uL Final  . RBC 03/15/2014 3.41* 4.22 - 5.81 MIL/uL Final  . Hemoglobin 03/15/2014 10.7* 13.0 - 17.0 g/dL Final  . HCT 03/15/2014 31.6* 39.0 - 52.0 % Final  . MCV 03/15/2014 92.7  78.0 - 100.0 fL Final  . MCH 03/15/2014 31.4  26.0 - 34.0 pg Final  .  MCHC 03/15/2014 33.9  30.0 - 36.0 g/dL Final  . RDW 03/15/2014 14.6  11.5 - 15.5 % Final  . Platelets 03/15/2014 212  150 - 400 K/uL Final  . MRSA by PCR  03/15/2014 NEGATIVE  NEGATIVE Final   Comment:                                 The GeneXpert MRSA Assay (FDA                          approved for NASAL specimens                          only), is one component of a                          comprehensive MRSA colonization                          surveillance program. It is not                          intended to diagnose MRSA                          infection nor to guide or                          monitor treatment for                          MRSA infections.  . Sodium 03/16/2014 140  137 - 147 mEq/L Final  . Potassium 03/16/2014 4.2  3.7 - 5.3 mEq/L Final  . Chloride 03/16/2014 107  96 - 112 mEq/L Final  . CO2 03/16/2014 22  19 - 32 mEq/L Final  . Glucose, Bld 03/16/2014 105* 70 - 99 mg/dL Final  . BUN 03/16/2014 22  6 - 23 mg/dL Final  . Creatinine, Ser 03/16/2014 0.98  0.50 - 1.35 mg/dL Final  . Calcium 03/16/2014 8.5  8.4 - 10.5 mg/dL Final  . GFR calc non Af Amer 03/16/2014 67* >90 mL/min Final  . GFR calc Af Amer 03/16/2014 78* >90 mL/min Final   Comment: (NOTE)                          The eGFR has been calculated using the CKD EPI equation.                          This calculation has not been validated in all clinical situations.                          eGFR's persistently <90 mL/min signify possible Chronic Kidney                          Disease.  . WBC 03/16/2014 11.1* 4.0 - 10.5 K/uL Final  . RBC 03/16/2014 3.39* 4.22 - 5.81 MIL/uL Final  . Hemoglobin 03/16/2014 10.6* 13.0 - 17.0 g/dL Final  . HCT 03/16/2014 31.7* 39.0 - 52.0 % Final  .  MCV 03/16/2014 93.5  78.0 - 100.0 fL Final  . MCH 03/16/2014 31.3  26.0 - 34.0 pg Final  . MCHC 03/16/2014 33.4  30.0 - 36.0 g/dL Final  . RDW 03/16/2014 14.7  11.5 - 15.5 % Final  . Platelets 03/16/2014 248  150 - 400 K/uL Final  . Specimen Description 03/16/2014 BLOOD RIGHT HAND   Final  . Special Requests 03/16/2014 BOTTLES DRAWN AEROBIC ONLY 10CC   Final  . Culture  Setup  Time 03/16/2014    Final                   Value:03/16/2014 22:40                         Performed at [facility]  . Culture 03/16/2014    Final                   Value:NO GROWTH 5 DAYS                         Performed at [facility]  . Report Status 03/16/2014 03/22/2014 FINAL   Final  . Specimen Description 03/16/2014 BLOOD LEFT HAND   Final  . Special Requests 03/16/2014 BOTTLES DRAWN AEROBIC ONLY 10CC   Final  . Culture  Setup Time 03/16/2014    Final                   Value:03/16/2014 22:40                         Performed at [facility]  . Culture 03/16/2014    Final                   Value:NO GROWTH 5 DAYS                         Performed at [facility]  . Report Status 03/16/2014 03/22/2014 FINAL   Final  . WBC 03/17/2014 10.9* 4.0 - 10.5 K/uL Final  . RBC 03/17/2014 3.62* 4.22 - 5.81 MIL/uL Final  . Hemoglobin 03/17/2014 11.0* 13.0 - 17.0 g/dL Final  . HCT 03/17/2014 34.1* 39.0 - 52.0 % Final  . MCV 03/17/2014 94.2  78.0 - 100.0 fL Final  . MCH 03/17/2014 30.4  26.0 - 34.0 pg Final  . MCHC 03/17/2014 32.3  30.0 - 36.0 g/dL Final  . RDW 03/17/2014 14.7  11.5 - 15.5 % Final  . Platelets 03/17/2014 244  150 - 400 K/uL Final  . Sodium 03/17/2014 139  137 - 147 mEq/L Final  . Potassium 03/17/2014 4.2  3.7 - 5.3 mEq/L Final  . Chloride 03/17/2014 107  96 - 112 mEq/L Final  . CO2 03/17/2014 23  19 - 32 mEq/L Final  . Glucose, Bld 03/17/2014 127* 70 - 99 mg/dL Final  . BUN 03/17/2014 26* 6 - 23 mg/dL Final  . Creatinine, Ser 03/17/2014 0.97  0.50 - 1.35 mg/dL Final  . Calcium 03/17/2014 8.4  8.4 - 10.5 mg/dL Final  . GFR calc non Af Amer 03/17/2014 67* >90 mL/min Final  . GFR calc Af Amer 03/17/2014 78* >90 mL/min Final   Comment: (NOTE)                          The eGFR has been calculated  using the CKD EPI equation.                          This calculation has not been validated in all clinical situations.                           eGFR's persistently <90 mL/min signify possible Chronic Kidney                          Disease.  . WBC 03/19/2014 11.6* 4.0 - 10.5 K/uL Final  . RBC 03/19/2014 3.37* 4.22 - 5.81 MIL/uL Final  . Hemoglobin 03/19/2014 10.5* 13.0 - 17.0 g/dL Final  . HCT 03/19/2014 31.6* 39.0 - 52.0 % Final  . MCV 03/19/2014 93.8  78.0 - 100.0 fL Final  . MCH 03/19/2014 31.2  26.0 - 34.0 pg Final  . MCHC 03/19/2014 33.2  30.0 - 36.0 g/dL Final  . RDW 03/19/2014 14.5  11.5 - 15.5 % Final  . Platelets 03/19/2014 269  150 - 400 K/uL Final  . Sodium 03/19/2014 138  137 - 147 mEq/L Final  . Potassium 03/19/2014 4.3  3.7 - 5.3 mEq/L Final  . Chloride 03/19/2014 105  96 - 112 mEq/L Final  . CO2 03/19/2014 24  19 - 32 mEq/L Final  . Glucose, Bld 03/19/2014 108* 70 - 99 mg/dL Final  . BUN 03/19/2014 22  6 - 23 mg/dL Final  . Creatinine, Ser 03/19/2014 0.94  0.50 - 1.35 mg/dL Final  . Calcium 03/19/2014 8.6  8.4 - 10.5 mg/dL Final  . GFR calc non Af Amer 03/19/2014 68* >90 mL/min Final  . GFR calc Af Amer 03/19/2014 79* >90 mL/min Final   Comment: (NOTE)                          The eGFR has been calculated using the CKD EPI equation.                          This calculation has not been validated in all clinical situations.                          eGFR's persistently <90 mL/min signify possible Chronic Kidney                          Disease.  Nursing Home on 03/13/2014  Component Date Value Ref Range Status  . Hemoglobin 03/13/2014 11.6* 13.5 - 17.5 g/dL Final  . HCT 03/13/2014 33* 41 - 53 % Final  . Platelets 03/13/2014 208  150 - 399 K/L Final  . WBC 03/13/2014 17.2   Final  . Glucose 03/13/2014 110   Final  . BUN 03/13/2014 29* 4 - 21 mg/dL Final  . Creatinine 03/13/2014 1.0  .6 - 1.3 mg/dL Final  . Potassium 03/13/2014 4.3  3.4 - 5.3 mmol/L Final  . Sodium 03/13/2014 138  137 - 147 mmol/L Final  . Alkaline Phosphatase 03/13/2014 49  25 - 125 U/L Final  . ALT 03/13/2014 18  10 - 40 U/L Final  . AST  03/13/2014 32  14 - 40 U/L Final  . Bilirubin, Total 03/13/2014 0.7   Final  . TSH 03/13/2014 1.08  .41 - 5.90 uIU/mL Final  Assessment/Plan 1. Altered mental status Related to delirium and recent sepsis  2. Delirium Due to poor condition, cerebrovascular disease, and recent sepsis  3. Essential hypertension, benign controlled  4. Cerebrovascular disease Unchanged, small vessel  5. Cerebral atrophy  6. Cerebellar atrophy Has had some compromise of movements in the past ans very poor balance

## 2014-04-13 ENCOUNTER — Telehealth: Payer: Self-pay | Admitting: Cardiology

## 2014-04-13 ENCOUNTER — Encounter: Payer: Self-pay | Admitting: Internal Medicine

## 2014-04-13 ENCOUNTER — Ambulatory Visit (INDEPENDENT_AMBULATORY_CARE_PROVIDER_SITE_OTHER): Payer: Medicare Other | Admitting: *Deleted

## 2014-04-13 DIAGNOSIS — I442 Atrioventricular block, complete: Secondary | ICD-10-CM

## 2014-04-13 DIAGNOSIS — I4819 Other persistent atrial fibrillation: Secondary | ICD-10-CM

## 2014-04-13 DIAGNOSIS — I4891 Unspecified atrial fibrillation: Secondary | ICD-10-CM

## 2014-04-13 NOTE — Telephone Encounter (Signed)
Nurse called and stated that she had to unplug transmitter b/c it was beeping. I instructed nurse to send a manual transmission and how to do this. She verbalized understanding.

## 2014-04-13 NOTE — Progress Notes (Signed)
Remote pacemaker transmission.   

## 2014-04-20 ENCOUNTER — Encounter: Payer: Self-pay | Admitting: Nurse Practitioner

## 2014-04-20 ENCOUNTER — Encounter: Payer: Self-pay | Admitting: *Deleted

## 2014-04-20 LAB — MDC_IDC_ENUM_SESS_TYPE_REMOTE
Battery Remaining Longevity: 52 mo
Battery Remaining Percentage: 48 %
Brady Statistic AS VS Percent: 1 %
Brady Statistic RA Percent Paced: 25 %
Brady Statistic RV Percent Paced: 99 %
Implantable Pulse Generator Model: 2210
Implantable Pulse Generator Serial Number: 7126761
Lead Channel Impedance Value: 380 Ohm
Lead Channel Pacing Threshold Amplitude: 1 V
Lead Channel Pacing Threshold Amplitude: 2.125 V
Lead Channel Pacing Threshold Pulse Width: 0.5 ms
Lead Channel Sensing Intrinsic Amplitude: 12 mV
Lead Channel Setting Pacing Amplitude: 2 V
Lead Channel Setting Pacing Amplitude: 2.375
MDC IDC MSMT BATTERY VOLTAGE: 2.89 V
MDC IDC MSMT LEADCHNL RA SENSING INTR AMPL: 3.8 mV
MDC IDC MSMT LEADCHNL RV IMPEDANCE VALUE: 640 Ohm
MDC IDC MSMT LEADCHNL RV PACING THRESHOLD PULSEWIDTH: 0.5 ms
MDC IDC SESS DTM: 20150622151845
MDC IDC SET LEADCHNL RV PACING PULSEWIDTH: 0.5 ms
MDC IDC SET LEADCHNL RV SENSING SENSITIVITY: 2.5 mV
MDC IDC STAT BRADY AP VP PERCENT: 34 %
MDC IDC STAT BRADY AP VS PERCENT: 1 %
MDC IDC STAT BRADY AS VP PERCENT: 65 %

## 2014-04-20 NOTE — Progress Notes (Signed)
This encounter was created in error - please disregard.

## 2014-04-21 ENCOUNTER — Encounter: Payer: Self-pay | Admitting: Nurse Practitioner

## 2014-04-21 ENCOUNTER — Non-Acute Institutional Stay (SKILLED_NURSING_FACILITY): Payer: Medicare Other | Admitting: Nurse Practitioner

## 2014-04-21 DIAGNOSIS — I4891 Unspecified atrial fibrillation: Secondary | ICD-10-CM

## 2014-04-21 DIAGNOSIS — I10 Essential (primary) hypertension: Secondary | ICD-10-CM

## 2014-04-21 DIAGNOSIS — R5381 Other malaise: Secondary | ICD-10-CM

## 2014-04-21 DIAGNOSIS — R5383 Other fatigue: Secondary | ICD-10-CM | POA: Insufficient documentation

## 2014-04-21 DIAGNOSIS — I480 Paroxysmal atrial fibrillation: Secondary | ICD-10-CM

## 2014-04-21 DIAGNOSIS — L299 Pruritus, unspecified: Secondary | ICD-10-CM

## 2014-04-21 DIAGNOSIS — F4323 Adjustment disorder with mixed anxiety and depressed mood: Secondary | ICD-10-CM

## 2014-04-21 DIAGNOSIS — D72829 Elevated white blood cell count, unspecified: Secondary | ICD-10-CM

## 2014-04-21 DIAGNOSIS — R05 Cough: Secondary | ICD-10-CM

## 2014-04-21 DIAGNOSIS — R059 Cough, unspecified: Secondary | ICD-10-CM

## 2014-04-21 NOTE — Assessment & Plan Note (Addendum)
Sleeps most of time. Easily to be aroused. More slurred speech-mumbles in pm noted. No apparent limb weakness or facial droop presently. Continue to observe. UA C/S to r/o UTI

## 2014-04-21 NOTE — Assessment & Plan Note (Signed)
Rate controlled. Takes Xarelto for thromboembolic events risk reduction.  

## 2014-04-21 NOTE — Assessment & Plan Note (Signed)
Cough/choke episode 10-15 minutes after meals SNF. Change Benzonatate to tid prn. In hospital: Chest x-ray negative, possibility of bronchitis, aspiration pneumonia. Speech therapy was consulted, patient does have some dysphagia, placed on dysphagia 2 diet, thin liquids. Will obtain CXR to evaluate further. Omeprazole 20 mg to cover possible GERD related symptoms. Update CBC and CMP. GI consult if no better.

## 2014-04-21 NOTE — Assessment & Plan Note (Signed)
Controlled on Cardura 2mg  and Losartan 50mg 

## 2014-04-21 NOTE — Assessment & Plan Note (Signed)
chronic itching-not apparent. Continue Zyrtec prn.

## 2014-04-21 NOTE — Assessment & Plan Note (Signed)
Update CBC. 

## 2014-04-21 NOTE — Progress Notes (Signed)
Patient ID: Nicholas Hood, male   DOB: 05-09-1916, 80 y.o.   MRN: [id number]     Allergies  Allergen Reactions  . Accupril [Quinapril Hcl]     Per MAR  . Alacepril     Per MAR  . Altace [Ramipril]     Unknown reaction   . Hctz [Hydrochlorothiazide]     Unknown reaction  . Inderal [Propranolol]     Per MAR  . Mobic [Meloxicam]     Per MAR  . Monopril [Fosinopril]     Per MAR  . Quinapril Hcl     Unknown reaction  . Reserpine     Per [facility]    Chief Complaint  Patient presents with  . Medical Management of Chronic Issues  . Acute Visit    cough, lathargy, more slurred speech in pm.     HPI: Patient is a 80 y.o. male seen in the SNF at [facility] today for lethargy, more slurred speech in pm, cough,  and other chronic medical conditions.     Hospitalized 03/14/2014-03/20/2014 for Delirium-acute resolved, gram-negative rods sepsis UTI urinary tract infection-complete 10 day course of Levaquin SNF.   Problem List Items Addressed This Visit   Essential hypertension, benign     Controlled on Cardura 2mg  and Losartan 50mg        Atrial fibrillation     Rate controlled. Takes Xarelto for thromboembolic events risk reduction.      Pruritus     chronic itching-not apparent. Continue Zyrtec prn.       Leukocytosis, unspecified     Update CBC    Adjustment disorder with mixed anxiety and depressed mood     Stabilized.    Cough - Primary     Cough/choke episode 10-15 minutes after meals SNF. Change Benzonatate to tid prn. In hospital: Chest x-ray negative, possibility of bronchitis, aspiration pneumonia. Speech therapy was consulted, patient does have some dysphagia, placed on dysphagia 2 diet, thin liquids. Will obtain CXR to evaluate further. Omeprazole 20 mg to cover possible GERD related symptoms. Update CBC and CMP. GI consult if no better.       Lethargy     Sleeps most of time. Easily to be aroused. More slurred speech-mumbles in pm noted. No apparent limb  weakness or facial droop presently. Continue to observe. UA C/S to r/o UTI       Review of Systems:  Review of Systems  Constitutional: Positive for malaise/fatigue. Negative for fever, chills, weight loss and diaphoresis.  HENT: Positive for hearing loss. Negative for congestion, ear discharge, ear pain, nosebleeds and tinnitus.   Eyes: Negative for blurred vision, double vision, photophobia, pain and discharge.  Respiratory: Positive for cough. Negative for hemoptysis, sputum production, shortness of breath, wheezing and stridor.   Cardiovascular: Negative for chest pain, palpitations, orthopnea, claudication, leg swelling and PND.  Gastrointestinal: Positive for constipation. Negative for heartburn, nausea, vomiting, abdominal pain, diarrhea and blood in stool.  Genitourinary: Positive for frequency. Negative for dysuria, urgency, hematuria and flank pain.  Musculoskeletal: Negative for back pain, falls, joint pain, myalgias and neck pain.       W/c for mobility. Mechanical lift to get out/in bed  Skin: Positive for itching.       Hx of chronic itching.   Neurological: Positive for tremors. Negative for dizziness, tingling, sensory change, speech change, focal weakness, seizures, loss of consciousness, weakness and headaches.       Noted resting tremor in the right fingers  Endo/Heme/Allergies: Positive for environmental allergies. Negative for polydipsia. Bruises/bleeds easily.  Psychiatric/Behavioral: Positive for memory loss. Negative for depression, suicidal ideas, hallucinations and substance abuse. The patient is not nervous/anxious and does not have insomnia.      Past Medical History  Diagnosis Date  . Hypertension   . Prostate cancer   . Spinal stenosis   . Peripheral neuropathy   . Diverticulosis   . Complete heart block     s/p PPM  . Vitamin D deficiency   . Paroxysmal atrial fibrillation   . Congestive heart failure, unspecified 03/05/2012  . Unspecified  hypertrophic and atrophic condition of skin 01/16/2012  . Sebaceous cyst 01/16/2012  . Unspecified constipation 11/28/2011  . Other malaise and fatigue 01/28/2008  . Nonspecific abnormal electrocardiogram (ECG) (EKG) 01/28/2008  . Internal hemorrhoids without mention of complication 0/93/2671  . Unspecified hearing loss 02/19/2007  . Osteoarthrosis, unspecified whether generalized or localized, unspecified site 02/19/2007  . Abnormality of gait 06/04/1999  . Aortic valve disorders 06/04/1979  . Cerebellar atrophy 03/20/14  . Cerebrovascular disease 03/20/14    small vessell disease  . Urinary tract infection, site not specified 03/14/14  . Delirium 03/14/14  . Sepsis 03/14/14  . Anemia of chronic disease 2014  . Dysphagia 2015   Past Surgical History  Procedure Laterality Date  . Pacemaker insertion  01-04-2010    STJ 2210-RF dual chamber pacemaker implanted by Dr Rayann Heman for CHB  . Back surgery  2000    Spinal stenosis Dr. Achilles Dunk  . Nasal polyp excision    . Tonsillectomy    . Cataract extraction w/ intraocular lens  implant, bilateral  10/2009    Dr. Ellie Lunch  . Pacemaker placement  01/05/2010  . Colonoscopy  07/25/2007    int hem. diverticulosis  Dr. Lajoyce Corners   Social History:   reports that he quit smoking about 76 years ago. He has never used smokeless tobacco. He reports that he does not drink alcohol or use illicit drugs.  Family History  Problem Relation Age of Onset  . Coronary artery disease      family history  . Stroke Mother   . Heart disease Father   . Heart disease Brother   . Heart disease Brother   . Heart disease Brother   . Lung disease Brother     Medications: Patient's Medications  New Prescriptions   No medications on file  Previous Medications   ACETAMINOPHEN (TYLENOL) 325 MG TABLET    Take 650 mg by mouth every 4 (four) hours as needed for fever (temp over 100).   ALBUTEROL (PROVENTIL) (2.5 MG/3ML) 0.083% NEBULIZER SOLUTION    Take 3 mLs (2.5 mg total) by  nebulization every 4 (four) hours as needed for wheezing or shortness of breath.   BENZONATATE (TESSALON) 200 MG CAPSULE    Take 1 capsule (200 mg total) by mouth 3 (three) times daily. For cough   CALCIUM CARB-CHOLECALCIFEROL (OYSTER SHELL CALCIUM + D) 500-400 MG-UNIT TABS    Take 1 tablet by mouth daily.   CETIRIZINE (ZYRTEC ALLERGY) 10 MG TABLET    Take 10 mg by mouth daily. To help with itching   CHOLECALCIFEROL (VITAMIN D) 1000 UNITS TABLET    Take 2,000 Units by mouth daily.   DOCUSATE SODIUM 100 MG CAPS    Take 100 mg by mouth 2 (two) times daily.   DOXAZOSIN (CARDURA) 2 MG TABLET    Take 2 mg by mouth daily.   GLUCOSAMINE-CHONDROITIN 500-400 MG CAPS  Take 3 capsules by mouth daily.   LOSARTAN (COZAAR) 50 MG TABLET    Take 50 mg by mouth daily. For Hypertention   OMEPRAZOLE (PRILOSEC) 20 MG CAPSULE    Take 20 mg by mouth daily.   RIVAROXABAN (XARELTO) 15 MG TABS TABLET    Take 15 mg by mouth daily after supper.  Modified Medications   No medications on file  Discontinued Medications   LEVOFLOXACIN (LEVAQUIN) 500 MG TABLET    Take 1 tablet (500 mg total) by mouth daily. For 10 more days     Physical Exam: Physical Exam  Constitutional: He is oriented to person, place, and time. He appears well-developed and well-nourished. No distress.  HENT:  Head: Normocephalic and atraumatic.  Right Ear: External ear normal.  Left Ear: External ear normal.  Nose: Nose normal.  Mouth/Throat: Oropharynx is clear and moist. No oropharyngeal exudate.  Eyes: Conjunctivae and EOM are normal. Pupils are equal, round, and reactive to light. Right eye exhibits no discharge. Left eye exhibits no discharge. No scleral icterus.  Neck: Normal range of motion. Neck supple. No JVD present. No tracheal deviation present. No thyromegaly present.  Cardiovascular: Normal rate, normal heart sounds and intact distal pulses.   No murmur heard. A-fib  Pulmonary/Chest: Effort normal and breath sounds normal. No  stridor. No respiratory distress. He has no wheezes. He has no rales. He exhibits no tenderness.  Abdominal: Soft. Bowel sounds are normal. He exhibits no distension. There is no tenderness. There is no rebound and no guarding.  Musculoskeletal: Normal range of motion. He exhibits no edema and no tenderness.  Motor scooter for mobility.   Lymphadenopathy:    He has no cervical adenopathy.  Neurological: He is alert and oriented to person, place, and time. He has normal reflexes. No cranial nerve deficit. He exhibits abnormal muscle tone. Coordination normal.  Reported more slurred speech in pm. No limb weakness or facial droop.   Skin: Skin is dry. No rash noted. He is not diaphoretic. No erythema. No pallor.  Scratchy marks right arm and right neck.   Psychiatric: He has a normal mood and affect. His behavior is normal. Judgment and thought content normal.  Repetitive. Confusion. Anxious.     Filed Vitals:   04/21/14 1236  BP: 104/64  Pulse: 96  Temp: 98.6 F (37 C)  TempSrc: Tympanic  Resp: 20      Labs reviewed: Basic Metabolic Panel:  Recent Labs  07/08/13 03/13/14  03/16/14 0441 03/17/14 0627 03/19/14 0613 03/27/14  NA 141 138  < > 140 139 138 138  K 3.9 4.3  < > 4.2 4.2 4.3 4.4  CL  --   --   < > 107 107 105  --   CO2  --   --   < > 22 23 24   --   GLUCOSE  --   --   < > 105* 127* 108*  --   BUN 25* 29*  < > 22 26* 22 25*  CREATININE 1.2 1.0  < > 0.98 0.97 0.94 1.1  CALCIUM  --   --   < > 8.5 8.4 8.6  --   TSH  --  1.08  --   --   --   --   --   < > = values in this interval not displayed. Liver Function Tests:  Recent Labs  03/13/14 03/14/14 1800 03/27/14  AST 32 21 14  ALT 18 17 11   ALKPHOS 49 59  42  BILITOT  --  0.3  --   PROT  --  7.1  --   ALBUMIN  --  2.3*  --    CBC:  Recent Labs  03/14/14 1800  03/16/14 0441 03/17/14 0627 03/19/14 0613 03/27/14  WBC 13.3*  < > 11.1* 10.9* 11.6* 12.1  NEUTROABS 11.2*  --   --   --   --   --   HGB 9.8*  < >  10.6* 11.0* 10.5* 11.6*  HCT 30.5*  < > 31.7* 34.1* 31.6* 34*  MCV 93.6  < > 93.5 94.2 93.8  --   PLT 207  < > 248 244 269 318  < > = values in this interval not displayed. Assessment/Plan Cough Cough/choke episode 10-15 minutes after meals SNF. Change Benzonatate to tid prn. In hospital: Chest x-ray negative, possibility of bronchitis, aspiration pneumonia. Speech therapy was consulted, patient does have some dysphagia, placed on dysphagia 2 diet, thin liquids. Will obtain CXR to evaluate further. Omeprazole 20 mg to cover possible GERD related symptoms. Update CBC and CMP. GI consult if no better.     Lethargy Sleeps most of time. Easily to be aroused. More slurred speech-mumbles in pm noted. No apparent limb weakness or facial droop presently. Continue to observe. UA C/S to r/o UTI  Essential hypertension, benign Controlled on Cardura 2mg  and Losartan 50mg      Pruritus chronic itching-not apparent. Continue Zyrtec prn.     Atrial fibrillation Rate controlled. Takes Xarelto for thromboembolic events risk reduction.    Leukocytosis, unspecified Update CBC  Adjustment disorder with mixed anxiety and depressed mood Stabilized.    Family/ Staff Communication: observe the patient.   Goals of Care: AL  Labs/tests ordered: CBC, CMP, CXR, and UA C/S

## 2014-04-21 NOTE — Assessment & Plan Note (Signed)
Stabilized.  

## 2014-04-22 ENCOUNTER — Encounter: Payer: Self-pay | Admitting: Cardiology

## 2014-04-22 LAB — CBC AND DIFFERENTIAL
HCT: 32 % — AB (ref 41–53)
Hemoglobin: 11.1 g/dL — AB (ref 13.5–17.5)
Platelets: 192 10*3/uL (ref 150–399)
WBC: 5.7 10*3/mL

## 2014-04-22 LAB — BASIC METABOLIC PANEL
BUN: 19 mg/dL (ref 4–21)
CREATININE: 0.8 mg/dL (ref 0.6–1.3)
Glucose: 92 mg/dL
POTASSIUM: 3.8 mmol/L (ref 3.4–5.3)
SODIUM: 142 mmol/L (ref 137–147)

## 2014-04-22 LAB — HEPATIC FUNCTION PANEL
ALT: 9 U/L — AB (ref 10–40)
AST: 14 U/L (ref 14–40)
Alkaline Phosphatase: 44 U/L (ref 25–125)
Bilirubin, Total: 0.5 mg/dL

## 2014-04-28 ENCOUNTER — Other Ambulatory Visit: Payer: Self-pay | Admitting: Nurse Practitioner

## 2014-05-05 ENCOUNTER — Non-Acute Institutional Stay (SKILLED_NURSING_FACILITY): Payer: Medicare Other | Admitting: Nurse Practitioner

## 2014-05-05 ENCOUNTER — Encounter: Payer: Self-pay | Admitting: Nurse Practitioner

## 2014-05-05 DIAGNOSIS — F4323 Adjustment disorder with mixed anxiety and depressed mood: Secondary | ICD-10-CM

## 2014-05-05 DIAGNOSIS — I4891 Unspecified atrial fibrillation: Secondary | ICD-10-CM

## 2014-05-05 DIAGNOSIS — K219 Gastro-esophageal reflux disease without esophagitis: Secondary | ICD-10-CM

## 2014-05-05 DIAGNOSIS — I10 Essential (primary) hypertension: Secondary | ICD-10-CM

## 2014-05-05 DIAGNOSIS — R319 Hematuria, unspecified: Secondary | ICD-10-CM

## 2014-05-05 DIAGNOSIS — I480 Paroxysmal atrial fibrillation: Secondary | ICD-10-CM

## 2014-05-05 NOTE — Assessment & Plan Note (Signed)
Controlled on Cardura 2mg  and Losartan 50mg 

## 2014-05-05 NOTE — Assessment & Plan Note (Signed)
Stabilized.  

## 2014-05-05 NOTE — Assessment & Plan Note (Signed)
Stable takes Omeprazole 20mg  daily.

## 2014-05-05 NOTE — Assessment & Plan Note (Signed)
Rate controlled. Takes Xarelto for thromboembolic events risk reduction.  

## 2014-05-05 NOTE — Progress Notes (Signed)
Patient ID: Nicholas Hood, male   DOB: [birth year], 80 y.o.   MRN: [id number]     Allergies  Allergen Reactions  . Accupril [Quinapril Hcl]     Per MAR  . Alacepril     Per MAR  . Altace [Ramipril]     Unknown reaction   . Hctz [Hydrochlorothiazide]     Unknown reaction  . Inderal [Propranolol]     Per MAR  . Mobic [Meloxicam]     Per MAR  . Monopril [Fosinopril]     Per MAR  . Quinapril Hcl     Unknown reaction  . Reserpine     Per [facility]    Chief Complaint  Patient presents with  . Medical Management of Chronic Issues  . Acute Visit    blood tinged urine.     HPI: Patient is a 80 y.o. male seen in the SNF at [facility] today for blood tinged urine  and other chronic medical conditions.     Hospitalized 03/14/2014-03/20/2014 for Delirium-acute resolved, gram-negative rods sepsis UTI urinary tract infection-complete 10 day course of Levaquin SNF.   Problem List Items Addressed This Visit   Essential hypertension, benign     Controlled on Cardura 2mg  and Losartan 50mg       Atrial fibrillation     Rate controlled. Takes Xarelto for thromboembolic events risk reduction.       Adjustment disorder with mixed anxiety and depressed mood     Stabilized.    GERD (gastroesophageal reflux disease)     Stable takes Omeprazole 20mg  daily.     Hematuria - Primary     Staff reported blood tinged urine, will obtain UA C/S, observe the patient.        Review of Systems:  Review of Systems  Constitutional: Negative for fever, chills, weight loss, malaise/fatigue and diaphoresis.  HENT: Positive for hearing loss. Negative for congestion, ear discharge, ear pain, nosebleeds and tinnitus.   Eyes: Negative for blurred vision, double vision, photophobia, pain and discharge.  Respiratory: Negative for cough, hemoptysis, sputum production, shortness of breath, wheezing and stridor.   Cardiovascular: Negative for chest pain, palpitations, orthopnea, claudication, leg  swelling and PND.  Gastrointestinal: Positive for constipation. Negative for heartburn, nausea, vomiting, abdominal pain, diarrhea and blood in stool.  Genitourinary: Positive for frequency. Negative for dysuria, urgency, hematuria and flank pain.       Staff reported blood tinged urine.   Musculoskeletal: Negative for back pain, falls, joint pain, myalgias and neck pain.       W/c for mobility. Mechanical lift to get out/in bed  Skin: Positive for itching.       Hx of chronic itching.   Neurological: Positive for tremors. Negative for dizziness, tingling, sensory change, speech change, focal weakness, seizures, loss of consciousness, weakness and headaches.       Noted resting tremor in the right fingers  Endo/Heme/Allergies: Positive for environmental allergies. Negative for polydipsia. Bruises/bleeds easily.  Psychiatric/Behavioral: Positive for memory loss. Negative for depression, suicidal ideas, hallucinations and substance abuse. The patient is not nervous/anxious and does not have insomnia.      Past Medical History  Diagnosis Date  . Hypertension   . Prostate cancer   . Spinal stenosis   . Peripheral neuropathy   . Diverticulosis   . Complete heart block     s/p PPM  . Vitamin D deficiency   . Paroxysmal atrial fibrillation   . Congestive heart failure, unspecified 03/05/2012  .  Unspecified hypertrophic and atrophic condition of skin 01/16/2012  . Sebaceous cyst 01/16/2012  . Unspecified constipation 11/28/2011  . Other malaise and fatigue 01/28/2008  . Nonspecific abnormal electrocardiogram (ECG) (EKG) 01/28/2008  . Internal hemorrhoids without mention of complication 1/61/0960  . Unspecified hearing loss 02/19/2007  . Osteoarthrosis, unspecified whether generalized or localized, unspecified site 02/19/2007  . Abnormality of gait 06/04/1999  . Aortic valve disorders 06/04/1979  . Cerebellar atrophy 03/20/14  . Cerebrovascular disease 03/20/14    small vessell disease  . Urinary  tract infection, site not specified 03/14/14  . Delirium 03/14/14  . Sepsis 03/14/14  . Anemia of chronic disease 2014  . Dysphagia 2015   Past Surgical History  Procedure Laterality Date  . Pacemaker insertion  01-04-2010    STJ 2210-RF dual chamber pacemaker implanted by Dr Rayann Heman for CHB  . Back surgery  2000    Spinal stenosis Dr. Achilles Dunk  . Nasal polyp excision    . Tonsillectomy    . Cataract extraction w/ intraocular lens  implant, bilateral  10/2009    Dr. Ellie Lunch  . Pacemaker placement  01/05/2010  . Colonoscopy  07/25/2007    int hem. diverticulosis  Dr. Lajoyce Corners   Social History:   reports that he quit smoking about 76 years ago. He has never used smokeless tobacco. He reports that he does not drink alcohol or use illicit drugs.  Family History  Problem Relation Age of Onset  . Coronary artery disease      family history  . Stroke Mother   . Heart disease Father   . Heart disease Brother   . Heart disease Brother   . Heart disease Brother   . Lung disease Brother     Medications: Patient's Medications  New Prescriptions   No medications on file  Previous Medications   ACETAMINOPHEN (TYLENOL) 325 MG TABLET    Take 650 mg by mouth every 4 (four) hours as needed for fever (temp over 100).   ALBUTEROL (PROVENTIL) (2.5 MG/3ML) 0.083% NEBULIZER SOLUTION    Take 3 mLs (2.5 mg total) by nebulization every 4 (four) hours as needed for wheezing or shortness of breath.   BENZONATATE (TESSALON) 200 MG CAPSULE    Take 1 capsule (200 mg total) by mouth 3 (three) times daily. For cough   CALCIUM CARB-CHOLECALCIFEROL (OYSTER SHELL CALCIUM + D) 500-400 MG-UNIT TABS    Take 1 tablet by mouth daily.   CETIRIZINE (ZYRTEC ALLERGY) 10 MG TABLET    Take 10 mg by mouth daily. To help with itching   CHOLECALCIFEROL (VITAMIN D) 1000 UNITS TABLET    Take 2,000 Units by mouth daily.   DOCUSATE SODIUM 100 MG CAPS    Take 100 mg by mouth 2 (two) times daily.   DOXAZOSIN (CARDURA) 2 MG TABLET    Take 2  mg by mouth daily.   GLUCOSAMINE-CHONDROITIN 500-400 MG CAPS    Take 3 capsules by mouth daily.   LOSARTAN (COZAAR) 50 MG TABLET    Take 50 mg by mouth daily. For Hypertention   OMEPRAZOLE (PRILOSEC) 20 MG CAPSULE    Take 20 mg by mouth daily.   RIVAROXABAN (XARELTO) 15 MG TABS TABLET    Take 15 mg by mouth daily after supper.  Modified Medications   No medications on file  Discontinued Medications   No medications on file     Physical Exam: Physical Exam  Constitutional: He is oriented to person, place, and time. He appears well-developed and  well-nourished. No distress.  HENT:  Head: Normocephalic and atraumatic.  Right Ear: External ear normal.  Left Ear: External ear normal.  Nose: Nose normal.  Mouth/Throat: Oropharynx is clear and moist. No oropharyngeal exudate.  Eyes: Conjunctivae and EOM are normal. Pupils are equal, round, and reactive to light. Right eye exhibits no discharge. Left eye exhibits no discharge. No scleral icterus.  Neck: Normal range of motion. Neck supple. No JVD present. No tracheal deviation present. No thyromegaly present.  Cardiovascular: Normal rate and intact distal pulses.   Murmur heard. A-fib. EM 3/6  Pulmonary/Chest: Effort normal and breath sounds normal. No stridor. No respiratory distress. He has no wheezes. He has no rales. He exhibits no tenderness.  Abdominal: Soft. Bowel sounds are normal. He exhibits no distension. There is no tenderness. There is no rebound and no guarding.  Musculoskeletal: Normal range of motion. He exhibits no edema and no tenderness.  Motor scooter for mobility.   Lymphadenopathy:    He has no cervical adenopathy.  Neurological: He is alert and oriented to person, place, and time. He has normal reflexes. No cranial nerve deficit. He exhibits abnormal muscle tone. Coordination normal.  Reported more slurred speech in pm. No limb weakness or facial droop.   Skin: Skin is dry. No rash noted. He is not diaphoretic. No  erythema. No pallor.  Scratchy marks right arm and right neck.   Psychiatric: He has a normal mood and affect. His behavior is normal. Judgment and thought content normal.  Repetitive. Confusion. Anxious.     Filed Vitals:   05/05/14 1356  BP: 154/86  Pulse: 70  Temp: 97.8 F (36.6 C)  TempSrc: Tympanic  Resp: 20      Labs reviewed: Basic Metabolic Panel:  Recent Labs  07/08/13 03/13/14  03/16/14 0441 03/17/14 0627 03/19/14 0613 03/27/14 04/22/14  NA 141 138  < > 140 139 138 138 142  K 3.9 4.3  < > 4.2 4.2 4.3 4.4 3.8  CL  --   --   < > 107 107 105  --   --   CO2  --   --   < > 22 23 24   --   --   GLUCOSE  --   --   < > 105* 127* 108*  --   --   BUN 25* 29*  < > 22 26* 22 25* 19  CREATININE 1.2 1.0  < > 0.98 0.97 0.94 1.1 0.8  CALCIUM  --   --   < > 8.5 8.4 8.6  --   --   TSH  --  1.08  --   --   --   --   --   --   < > = values in this interval not displayed. Liver Function Tests:  Recent Labs  03/14/14 1800 03/27/14 04/22/14  AST 21 14 14   ALT 17 11 9*  ALKPHOS 59 42 44  BILITOT 0.3  --   --   PROT 7.1  --   --   ALBUMIN 2.3*  --   --    CBC:  Recent Labs  03/14/14 1800  03/16/14 0441 03/17/14 0627 03/19/14 0613 03/27/14 04/22/14  WBC 13.3*  < > 11.1* 10.9* 11.6* 12.1 5.7  NEUTROABS 11.2*  --   --   --   --   --   --   HGB 9.8*  < > 10.6* 11.0* 10.5* 11.6* 11.1*  HCT 30.5*  < > 31.7* 34.1* 31.6*  34* 32*  MCV 93.6  < > 93.5 94.2 93.8  --   --   PLT 207  < > 248 244 269 318 192  < > = values in this interval not displayed. Assessment/Plan Essential hypertension, benign Controlled on Cardura 2mg  and Losartan 50mg     Atrial fibrillation Rate controlled. Takes Xarelto for thromboembolic events risk reduction.     Adjustment disorder with mixed anxiety and depressed mood Stabilized.  GERD (gastroesophageal reflux disease) Stable takes Omeprazole 20mg  daily.   Hematuria Staff reported blood tinged urine, will obtain UA C/S, observe the  patient.     Family/ Staff Communication: observe the patient.   Goals of Care: AL  Labs/tests ordered: UA C/S

## 2014-05-05 NOTE — Assessment & Plan Note (Signed)
Staff reported blood tinged urine, will obtain UA C/S, observe the patient.

## 2014-05-08 ENCOUNTER — Encounter: Payer: Self-pay | Admitting: Nurse Practitioner

## 2014-05-08 ENCOUNTER — Non-Acute Institutional Stay (SKILLED_NURSING_FACILITY): Payer: Medicare Other | Admitting: Nurse Practitioner

## 2014-05-08 DIAGNOSIS — I4891 Unspecified atrial fibrillation: Secondary | ICD-10-CM

## 2014-05-08 DIAGNOSIS — I480 Paroxysmal atrial fibrillation: Secondary | ICD-10-CM

## 2014-05-08 DIAGNOSIS — I10 Essential (primary) hypertension: Secondary | ICD-10-CM

## 2014-05-08 DIAGNOSIS — N390 Urinary tract infection, site not specified: Secondary | ICD-10-CM

## 2014-05-08 DIAGNOSIS — K219 Gastro-esophageal reflux disease without esophagitis: Secondary | ICD-10-CM

## 2014-05-08 DIAGNOSIS — F4323 Adjustment disorder with mixed anxiety and depressed mood: Secondary | ICD-10-CM

## 2014-05-08 NOTE — Assessment & Plan Note (Signed)
Stable takes Omeprazole 20mg  daily.

## 2014-05-08 NOTE — Assessment & Plan Note (Signed)
Controlled on Cardura 2mg  and Losartan 50mg 

## 2014-05-08 NOTE — Assessment & Plan Note (Signed)
Rate controlled. Takes Xarelto for thromboembolic events risk reduction.  

## 2014-05-08 NOTE — Progress Notes (Signed)
Patient ID: Nicholas Hood, male   DOB: 1916/03/14, 80 y.o.   MRN: [id number]     Allergies  Allergen Reactions  . Accupril [Quinapril Hcl]     Per MAR  . Alacepril     Per MAR  . Altace [Ramipril]     Unknown reaction   . Hctz [Hydrochlorothiazide]     Unknown reaction  . Inderal [Propranolol]     Per MAR  . Mobic [Meloxicam]     Per MAR  . Monopril [Fosinopril]     Per MAR  . Quinapril Hcl     Unknown reaction  . Reserpine     Per [facility]    Chief Complaint  Patient presents with  . Medical Management of Chronic Issues  . Acute Visit    UTI    HPI: Patient is a 80 y.o. male seen in the SNF at [facility] today for UTI and other chronic medical conditions.     Hospitalized 03/14/2014-03/20/2014 for Delirium-acute resolved, gram-negative rods sepsis UTI urinary tract infection-complete 10 day course of Levaquin SNF.   Problem List Items Addressed This Visit   Essential hypertension, benign     Controlled on Cardura 2mg  and Losartan 50mg        Atrial fibrillation     Rate controlled. Takes Xarelto for thromboembolic events risk reduction.      Urinary tract infection, site not specified - Primary     05/07/14 Urine culture E. Coli>100,000c/ml, Septra DS bid x 10days. Passing blood and low grade T.      Adjustment disorder with mixed anxiety and depressed mood     Stabilized. Takes lorazepam 0.5mg  qhs and prn also available to him.      GERD (gastroesophageal reflux disease)     Stable takes Omeprazole 20mg  daily.         Review of Systems:  Review of Systems  Constitutional: Positive for fever. Negative for chills, weight loss, malaise/fatigue and diaphoresis.       On and off low grade T  HENT: Positive for hearing loss. Negative for congestion, ear discharge, ear pain, nosebleeds and tinnitus.   Eyes: Negative for blurred vision, double vision, photophobia, pain and discharge.  Respiratory: Negative for cough, hemoptysis, sputum production,  shortness of breath, wheezing and stridor.   Cardiovascular: Negative for chest pain, palpitations, orthopnea, claudication, leg swelling and PND.  Gastrointestinal: Positive for constipation. Negative for heartburn, nausea, vomiting, abdominal pain, diarrhea and blood in stool.  Genitourinary: Positive for frequency and hematuria. Negative for dysuria, urgency and flank pain.       Staff reported blood tinged urine.   Musculoskeletal: Negative for back pain, falls, joint pain, myalgias and neck pain.       W/c for mobility. Mechanical lift to get out/in bed  Skin: Positive for itching.       Hx of chronic itching.   Neurological: Positive for tremors. Negative for dizziness, tingling, sensory change, speech change, focal weakness, seizures, loss of consciousness, weakness and headaches.       Noted resting tremor in the right fingers  Endo/Heme/Allergies: Positive for environmental allergies. Negative for polydipsia. Bruises/bleeds easily.  Psychiatric/Behavioral: Positive for memory loss. Negative for depression, suicidal ideas, hallucinations and substance abuse. The patient is not nervous/anxious and does not have insomnia.      Past Medical History  Diagnosis Date  . Hypertension   . Prostate cancer   . Spinal stenosis   . Peripheral neuropathy   . Diverticulosis   .  Complete heart block     s/p PPM  . Vitamin D deficiency   . Paroxysmal atrial fibrillation   . Congestive heart failure, unspecified 03/05/2012  . Unspecified hypertrophic and atrophic condition of skin 01/16/2012  . Sebaceous cyst 01/16/2012  . Unspecified constipation 11/28/2011  . Other malaise and fatigue 01/28/2008  . Nonspecific abnormal electrocardiogram (ECG) (EKG) 01/28/2008  . Internal hemorrhoids without mention of complication 7/67/3419  . Unspecified hearing loss 02/19/2007  . Osteoarthrosis, unspecified whether generalized or localized, unspecified site 02/19/2007  . Abnormality of gait 06/04/1999  . Aortic  valve disorders 06/04/1979  . Cerebellar atrophy 03/20/14  . Cerebrovascular disease 03/20/14    small vessell disease  . Urinary tract infection, site not specified 03/14/14  . Delirium 03/14/14  . Sepsis 03/14/14  . Anemia of chronic disease 2014  . Dysphagia 2015   Past Surgical History  Procedure Laterality Date  . Pacemaker insertion  01-04-2010    STJ 2210-RF dual chamber pacemaker implanted by Dr Rayann Heman for CHB  . Back surgery  2000    Spinal stenosis Dr. Achilles Dunk  . Nasal polyp excision    . Tonsillectomy    . Cataract extraction w/ intraocular lens  implant, bilateral  10/2009    Dr. Ellie Lunch  . Pacemaker placement  01/05/2010  . Colonoscopy  07/25/2007    int hem. diverticulosis  Dr. Lajoyce Corners   Social History:   reports that he quit smoking about 76 years ago. He has never used smokeless tobacco. He reports that he does not drink alcohol or use illicit drugs.  Family History  Problem Relation Age of Onset  . Coronary artery disease      family history  . Stroke Mother   . Heart disease Father   . Heart disease Brother   . Heart disease Brother   . Heart disease Brother   . Lung disease Brother     Medications: Patient's Medications  New Prescriptions   No medications on file  Previous Medications   ACETAMINOPHEN (TYLENOL) 325 MG TABLET    Take 650 mg by mouth every 4 (four) hours as needed for fever (temp over 100).   ALBUTEROL (PROVENTIL) (2.5 MG/3ML) 0.083% NEBULIZER SOLUTION    Take 3 mLs (2.5 mg total) by nebulization every 4 (four) hours as needed for wheezing or shortness of breath.   BENZONATATE (TESSALON) 200 MG CAPSULE    Take 1 capsule (200 mg total) by mouth 3 (three) times daily. For cough   CALCIUM CARB-CHOLECALCIFEROL (OYSTER SHELL CALCIUM + D) 500-400 MG-UNIT TABS    Take 1 tablet by mouth daily.   CETIRIZINE (ZYRTEC ALLERGY) 10 MG TABLET    Take 10 mg by mouth daily. To help with itching   CHOLECALCIFEROL (VITAMIN D) 1000 UNITS TABLET    Take 2,000 Units by  mouth daily.   DOCUSATE SODIUM 100 MG CAPS    Take 100 mg by mouth 2 (two) times daily.   DOXAZOSIN (CARDURA) 2 MG TABLET    Take 2 mg by mouth daily.   GLUCOSAMINE-CHONDROITIN 500-400 MG CAPS    Take 3 capsules by mouth daily.   LOSARTAN (COZAAR) 50 MG TABLET    Take 50 mg by mouth daily. For Hypertention   OMEPRAZOLE (PRILOSEC) 20 MG CAPSULE    Take 20 mg by mouth daily.   RIVAROXABAN (XARELTO) 15 MG TABS TABLET    Take 15 mg by mouth daily after supper.  Modified Medications   No medications on file  Discontinued Medications   No medications on file     Physical Exam: Physical Exam  Constitutional: He is oriented to person, place, and time. He appears well-developed and well-nourished. No distress.  HENT:  Head: Normocephalic and atraumatic.  Right Ear: External ear normal.  Left Ear: External ear normal.  Nose: Nose normal.  Mouth/Throat: Oropharynx is clear and moist. No oropharyngeal exudate.  Eyes: Conjunctivae and EOM are normal. Pupils are equal, round, and reactive to light. Right eye exhibits no discharge. Left eye exhibits no discharge. No scleral icterus.  Neck: Normal range of motion. Neck supple. No JVD present. No tracheal deviation present. No thyromegaly present.  Cardiovascular: Normal rate and intact distal pulses.   Murmur heard. A-fib. EM 3/6  Pulmonary/Chest: Effort normal and breath sounds normal. No stridor. No respiratory distress. He has no wheezes. He has no rales. He exhibits no tenderness.  Abdominal: Soft. Bowel sounds are normal. He exhibits no distension. There is no tenderness. There is no rebound and no guarding.  Musculoskeletal: Normal range of motion. He exhibits no edema and no tenderness.  Motor scooter for mobility.   Lymphadenopathy:    He has no cervical adenopathy.  Neurological: He is alert and oriented to person, place, and time. He has normal reflexes. No cranial nerve deficit. He exhibits abnormal muscle tone. Coordination normal.    Reported more slurred speech in pm. No limb weakness or facial droop.   Skin: Skin is dry. No rash noted. He is not diaphoretic. No erythema. No pallor.  Scratchy marks right arm and right neck.   Psychiatric: He has a normal mood and affect. His behavior is normal. Judgment and thought content normal.  Repetitive. Confusion. Anxious.     Filed Vitals:   05/08/14 1234  BP: 122/74  Pulse: 70  Temp: 97.8 F (36.6 C)  TempSrc: Tympanic  Resp: 20      Labs reviewed: Basic Metabolic Panel:  Recent Labs  07/08/13 03/13/14  03/16/14 0441 03/17/14 0627 03/19/14 0613 03/27/14 04/22/14  NA 141 138  < > 140 139 138 138 142  K 3.9 4.3  < > 4.2 4.2 4.3 4.4 3.8  CL  --   --   < > 107 107 105  --   --   CO2  --   --   < > 22 23 24   --   --   GLUCOSE  --   --   < > 105* 127* 108*  --   --   BUN 25* 29*  < > 22 26* 22 25* 19  CREATININE 1.2 1.0  < > 0.98 0.97 0.94 1.1 0.8  CALCIUM  --   --   < > 8.5 8.4 8.6  --   --   TSH  --  1.08  --   --   --   --   --   --   < > = values in this interval not displayed. Liver Function Tests:  Recent Labs  03/14/14 1800 03/27/14 04/22/14  AST 21 14 14   ALT 17 11 9*  ALKPHOS 59 42 44  BILITOT 0.3  --   --   PROT 7.1  --   --   ALBUMIN 2.3*  --   --    CBC:  Recent Labs  03/14/14 1800  03/16/14 0441 03/17/14 0627 03/19/14 0613 03/27/14 04/22/14  WBC 13.3*  < > 11.1* 10.9* 11.6* 12.1 5.7  NEUTROABS 11.2*  --   --   --   --   --   --  HGB 9.8*  < > 10.6* 11.0* 10.5* 11.6* 11.1*  HCT 30.5*  < > 31.7* 34.1* 31.6* 34* 32*  MCV 93.6  < > 93.5 94.2 93.8  --   --   PLT 207  < > 248 244 269 318 192  < > = values in this interval not displayed. Assessment/Plan Urinary tract infection, site not specified 05/07/14 Urine culture E. Coli>100,000c/ml, Septra DS bid x 10days. Passing blood and low grade T.    GERD (gastroesophageal reflux disease) Stable takes Omeprazole 20mg  daily.    Essential hypertension, benign Controlled on Cardura  2mg  and Losartan 50mg      Atrial fibrillation Rate controlled. Takes Xarelto for thromboembolic events risk reduction.    Adjustment disorder with mixed anxiety and depressed mood Stabilized. Takes lorazepam 0.5mg  qhs and prn also available to him.      Family/ Staff Communication: observe the patient.   Goals of Care: SNF  Labs/tests ordered: Urine culture done 05/07/14

## 2014-05-08 NOTE — Assessment & Plan Note (Signed)
05/07/14 Urine culture E. Coli>100,000c/ml, Septra DS bid x 10days. Passing blood and low grade T.

## 2014-05-08 NOTE — Assessment & Plan Note (Signed)
Stabilized. Takes lorazepam 0.5mg  qhs and prn also available to him.

## 2014-05-11 NOTE — Progress Notes (Signed)
Patient ID: Nicholas Hood, male   DOB: 1916/03/05, 80 y.o.   MRN: [id number] DOS 03/13/14 took place [facility] and CPT should be 99336

## 2014-05-11 NOTE — Addendum Note (Signed)
Addended by: Angeleah Labrake X on: 05/11/2014 01:06 PM   Modules accepted: Level of Service

## 2014-05-15 ENCOUNTER — Encounter: Payer: Self-pay | Admitting: Nurse Practitioner

## 2014-05-15 ENCOUNTER — Non-Acute Institutional Stay (SKILLED_NURSING_FACILITY): Payer: Medicare Other | Admitting: Nurse Practitioner

## 2014-05-15 DIAGNOSIS — K219 Gastro-esophageal reflux disease without esophagitis: Secondary | ICD-10-CM

## 2014-05-15 DIAGNOSIS — L299 Pruritus, unspecified: Secondary | ICD-10-CM

## 2014-05-15 DIAGNOSIS — L8990 Pressure ulcer of unspecified site, unspecified stage: Secondary | ICD-10-CM

## 2014-05-15 DIAGNOSIS — I10 Essential (primary) hypertension: Secondary | ICD-10-CM

## 2014-05-15 DIAGNOSIS — I480 Paroxysmal atrial fibrillation: Secondary | ICD-10-CM

## 2014-05-15 DIAGNOSIS — F4323 Adjustment disorder with mixed anxiety and depressed mood: Secondary | ICD-10-CM

## 2014-05-15 DIAGNOSIS — I4891 Unspecified atrial fibrillation: Secondary | ICD-10-CM

## 2014-05-15 NOTE — Assessment & Plan Note (Signed)
Rate controlled. Takes Xarelto for thromboembolic events risk reduction.  

## 2014-05-15 NOTE — Assessment & Plan Note (Signed)
chronic itching-red discoloration to knuckles, scratched self until bleed on L shin/R abd. Will change Zyrtec from prn to daily routine.

## 2014-05-15 NOTE — Assessment & Plan Note (Signed)
Stable takes Omeprazole 20mg  daily.

## 2014-05-15 NOTE — Assessment & Plan Note (Signed)
Stabilized. Takes lorazepam 0.5mg  qhs and prn also available to him.

## 2014-05-15 NOTE — Assessment & Plan Note (Addendum)
Beefy red area @ coccyx-pressure reduction. R heel open stage II pressure ulcer-not infected-will continue pressure reduction and apply Hydrocolloid dressing prn.

## 2014-05-15 NOTE — Assessment & Plan Note (Signed)
Controlled on Cardura 2mg  and Losartan 50mg 

## 2014-05-15 NOTE — Progress Notes (Signed)
Patient ID: Nicholas Hood, male   DOB: 04/27/1916, 80 y.o.   MRN: [id number]     Allergies  Allergen Reactions  . Accupril [Quinapril Hcl]     Per MAR  . Alacepril     Per MAR  . Altace [Ramipril]     Unknown reaction   . Hctz [Hydrochlorothiazide]     Unknown reaction  . Inderal [Propranolol]     Per MAR  . Mobic [Meloxicam]     Per MAR  . Monopril [Fosinopril]     Per MAR  . Quinapril Hcl     Unknown reaction  . Reserpine     Per [facility]    Chief Complaint  Patient presents with  . Medical Management of Chronic Issues  . Acute Visit    pressure areas R heel and coccyx, knuckles redness, pruritus.     HPI: Patient is a 80 y.o. male seen in the SNF at [facility] today for pressure areas R heel, coccyx, pruritus,  and other chronic medical conditions.     Hospitalized 03/14/2014-03/20/2014 for Delirium-acute resolved, gram-negative rods sepsis UTI urinary tract infection-complete 10 day course of Levaquin SNF.   Problem List Items Addressed This Visit   Essential hypertension, benign     Controlled on Cardura 2mg  and Losartan 50mg        Atrial fibrillation     Rate controlled. Takes Xarelto for thromboembolic events risk reduction.     Pruritus - Primary     chronic itching-red discoloration to knuckles, scratched self until bleed on L shin/R abd. Will change Zyrtec from prn to daily routine.       Adjustment disorder with mixed anxiety and depressed mood     Stabilized. Takes lorazepam 0.5mg  qhs and prn also available to him.       GERD (gastroesophageal reflux disease)     Stable takes Omeprazole 20mg  daily.       Decubitus ulcer     Beefy red area @ coccyx-pressure reduction. R heel open stage II pressure ulcer-not infected-will continue pressure reduction and apply Hydrocolloid dressing prn.        Review of Systems:  Review of Systems  Constitutional: Positive for fever. Negative for chills, weight loss, malaise/fatigue and diaphoresis.     On and off low grade T  HENT: Positive for hearing loss. Negative for congestion, ear discharge, ear pain, nosebleeds and tinnitus.   Eyes: Negative for blurred vision, double vision, photophobia, pain and discharge.  Respiratory: Negative for cough, hemoptysis, sputum production, shortness of breath, wheezing and stridor.   Cardiovascular: Negative for chest pain, palpitations, orthopnea, claudication, leg swelling and PND.  Gastrointestinal: Positive for constipation. Negative for heartburn, nausea, vomiting, abdominal pain, diarrhea and blood in stool.  Genitourinary: Positive for frequency and hematuria. Negative for dysuria, urgency and flank pain.       Staff reported blood tinged urine.   Musculoskeletal: Negative for back pain, falls, joint pain, myalgias and neck pain.       W/c for mobility. Mechanical lift to get out/in bed  Skin: Positive for itching.       Hx of chronic itching. R heel pressure ulcer-open-a penny size. Coccyx beefy red area.   Neurological: Positive for tremors. Negative for dizziness, tingling, sensory change, speech change, focal weakness, seizures, loss of consciousness, weakness and headaches.       Noted resting tremor in the right fingers  Endo/Heme/Allergies: Positive for environmental allergies. Negative for polydipsia. Bruises/bleeds easily.  Psychiatric/Behavioral: Positive  for memory loss. Negative for depression, suicidal ideas, hallucinations and substance abuse. The patient is not nervous/anxious and does not have insomnia.      Past Medical History  Diagnosis Date  . Hypertension   . Prostate cancer   . Spinal stenosis   . Peripheral neuropathy   . Diverticulosis   . Complete heart block     s/p PPM  . Vitamin D deficiency   . Paroxysmal atrial fibrillation   . Congestive heart failure, unspecified 03/05/2012  . Unspecified hypertrophic and atrophic condition of skin 01/16/2012  . Sebaceous cyst 01/16/2012  . Unspecified constipation  11/28/2011  . Other malaise and fatigue 01/28/2008  . Nonspecific abnormal electrocardiogram (ECG) (EKG) 01/28/2008  . Internal hemorrhoids without mention of complication 04/19/3150  . Unspecified hearing loss 02/19/2007  . Osteoarthrosis, unspecified whether generalized or localized, unspecified site 02/19/2007  . Abnormality of gait 06/04/1999  . Aortic valve disorders 06/04/1979  . Cerebellar atrophy 03/20/14  . Cerebrovascular disease 03/20/14    small vessell disease  . Urinary tract infection, site not specified 03/14/14  . Delirium 03/14/14  . Sepsis 03/14/14  . Anemia of chronic disease 2014  . Dysphagia 2015   Past Surgical History  Procedure Laterality Date  . Pacemaker insertion  01-04-2010    STJ 2210-RF dual chamber pacemaker implanted by Dr Rayann Heman for CHB  . Back surgery  2000    Spinal stenosis Dr. Achilles Dunk  . Nasal polyp excision    . Tonsillectomy    . Cataract extraction w/ intraocular lens  implant, bilateral  10/2009    Dr. Ellie Lunch  . Pacemaker placement  01/05/2010  . Colonoscopy  07/25/2007    int hem. diverticulosis  Dr. Lajoyce Corners   Social History:   reports that he quit smoking about 76 years ago. He has never used smokeless tobacco. He reports that he does not drink alcohol or use illicit drugs.  Family History  Problem Relation Age of Onset  . Coronary artery disease      family history  . Stroke Mother   . Heart disease Father   . Heart disease Brother   . Heart disease Brother   . Heart disease Brother   . Lung disease Brother     Medications: Patient's Medications  New Prescriptions   No medications on file  Previous Medications   ACETAMINOPHEN (TYLENOL) 325 MG TABLET    Take 650 mg by mouth every 4 (four) hours as needed for fever (temp over 100).   ALBUTEROL (PROVENTIL) (2.5 MG/3ML) 0.083% NEBULIZER SOLUTION    Take 3 mLs (2.5 mg total) by nebulization every 4 (four) hours as needed for wheezing or shortness of breath.   BENZONATATE (TESSALON) 200 MG CAPSULE     Take 1 capsule (200 mg total) by mouth 3 (three) times daily. For cough   CALCIUM CARB-CHOLECALCIFEROL (OYSTER SHELL CALCIUM + D) 500-400 MG-UNIT TABS    Take 1 tablet by mouth daily.   CETIRIZINE (ZYRTEC ALLERGY) 10 MG TABLET    Take 10 mg by mouth daily. To help with itching   CHOLECALCIFEROL (VITAMIN D) 1000 UNITS TABLET    Take 2,000 Units by mouth daily.   DOCUSATE SODIUM 100 MG CAPS    Take 100 mg by mouth 2 (two) times daily.   DOXAZOSIN (CARDURA) 2 MG TABLET    Take 2 mg by mouth daily.   GLUCOSAMINE-CHONDROITIN 500-400 MG CAPS    Take 3 capsules by mouth daily.   LOSARTAN (COZAAR) 50  MG TABLET    Take 50 mg by mouth daily. For Hypertention   OMEPRAZOLE (PRILOSEC) 20 MG CAPSULE    Take 20 mg by mouth daily.   RIVAROXABAN (XARELTO) 15 MG TABS TABLET    Take 15 mg by mouth daily after supper.  Modified Medications   No medications on file  Discontinued Medications   No medications on file     Physical Exam: Physical Exam  Constitutional: He is oriented to person, place, and time. He appears well-developed and well-nourished. No distress.  HENT:  Head: Normocephalic and atraumatic.  Right Ear: External ear normal.  Left Ear: External ear normal.  Nose: Nose normal.  Mouth/Throat: Oropharynx is clear and moist. No oropharyngeal exudate.  Eyes: Conjunctivae and EOM are normal. Pupils are equal, round, and reactive to light. Right eye exhibits no discharge. Left eye exhibits no discharge. No scleral icterus.  Neck: Normal range of motion. Neck supple. No JVD present. No tracheal deviation present. No thyromegaly present.  Cardiovascular: Normal rate and intact distal pulses.   Murmur heard. A-fib. EM 3/6  Pulmonary/Chest: Effort normal. No stridor. No respiratory distress. He has no wheezes. He has rales. He exhibits no tenderness.  Posterior mid to lower lungs dry rales.   Abdominal: Soft. Bowel sounds are normal. He exhibits no distension. There is no tenderness. There is no  rebound and no guarding.  Musculoskeletal: Normal range of motion. He exhibits no edema and no tenderness.  Motor scooter for mobility.   Lymphadenopathy:    He has no cervical adenopathy.  Neurological: He is alert and oriented to person, place, and time. He has normal reflexes. No cranial nerve deficit. He exhibits abnormal muscle tone. Coordination normal.  Reported more slurred speech in pm. No limb weakness or facial droop.   Skin: Skin is dry. No rash noted. He is not diaphoretic. No erythema. No pallor.  Scratchy marks left shin and right abd.   Psychiatric: He has a normal mood and affect. His behavior is normal. Judgment and thought content normal.  Repetitive. Confusion. Anxious.     Filed Vitals:   05/15/14 1413  BP: 127/65  Pulse: 70  Temp: 98 F (36.7 C)  TempSrc: Tympanic  Resp: 18      Labs reviewed: Basic Metabolic Panel:  Recent Labs  07/08/13 03/13/14  03/16/14 0441 03/17/14 0627 03/19/14 0613 03/27/14 04/22/14  NA 141 138  < > 140 139 138 138 142  K 3.9 4.3  < > 4.2 4.2 4.3 4.4 3.8  CL  --   --   < > 107 107 105  --   --   CO2  --   --   < > 22 23 24   --   --   GLUCOSE  --   --   < > 105* 127* 108*  --   --   BUN 25* 29*  < > 22 26* 22 25* 19  CREATININE 1.2 1.0  < > 0.98 0.97 0.94 1.1 0.8  CALCIUM  --   --   < > 8.5 8.4 8.6  --   --   TSH  --  1.08  --   --   --   --   --   --   < > = values in this interval not displayed. Liver Function Tests:  Recent Labs  03/14/14 1800 03/27/14 04/22/14  AST 21 14 14   ALT 17 11 9*  ALKPHOS 59 42 44  BILITOT 0.3  --   --  PROT 7.1  --   --   ALBUMIN 2.3*  --   --    CBC:  Recent Labs  03/14/14 1800  03/16/14 0441 03/17/14 0627 03/19/14 0613 03/27/14 04/22/14  WBC 13.3*  < > 11.1* 10.9* 11.6* 12.1 5.7  NEUTROABS 11.2*  --   --   --   --   --   --   HGB 9.8*  < > 10.6* 11.0* 10.5* 11.6* 11.1*  HCT 30.5*  < > 31.7* 34.1* 31.6* 34* 32*  MCV 93.6  < > 93.5 94.2 93.8  --   --   PLT 207  < > 248 244  269 318 192  < > = values in this interval not displayed. Assessment/Plan Pruritus chronic itching-red discoloration to knuckles, scratched self until bleed on L shin/R abd. Will change Zyrtec from prn to daily routine.     Decubitus ulcer Beefy red area @ coccyx-pressure reduction. R heel open stage II pressure ulcer-not infected-will continue pressure reduction and apply Hydrocolloid dressing prn.   GERD (gastroesophageal reflux disease) Stable takes Omeprazole 20mg  daily.     Essential hypertension, benign Controlled on Cardura 2mg  and Losartan 50mg      Atrial fibrillation Rate controlled. Takes Xarelto for thromboembolic events risk reduction.   Adjustment disorder with mixed anxiety and depressed mood Stabilized. Takes lorazepam 0.5mg  qhs and prn also available to him.       Family/ Staff Communication: observe the patient.   Goals of Care: SNF  Labs/tests ordered: none.

## 2014-05-18 LAB — BASIC METABOLIC PANEL
BUN: 19 mg/dL (ref 4–21)
CREATININE: 1.1 mg/dL (ref 0.6–1.3)
GLUCOSE: 148 mg/dL
Potassium: 4.1 mmol/L (ref 3.4–5.3)
Sodium: 135 mmol/L — AB (ref 137–147)

## 2014-05-18 LAB — HEPATIC FUNCTION PANEL
ALT: 11 U/L (ref 10–40)
AST: 15 U/L (ref 14–40)
Alkaline Phosphatase: 55 U/L (ref 25–125)
Bilirubin, Total: 0.4 mg/dL

## 2014-05-18 LAB — CBC AND DIFFERENTIAL
HCT: 34 % — AB (ref 41–53)
Hemoglobin: 11.9 g/dL — AB (ref 13.5–17.5)
PLATELETS: 158 10*3/uL (ref 150–399)
WBC: 4 10^3/mL

## 2014-05-19 ENCOUNTER — Encounter: Payer: Self-pay | Admitting: Nurse Practitioner

## 2014-05-19 ENCOUNTER — Non-Acute Institutional Stay (SKILLED_NURSING_FACILITY): Payer: Medicare Other | Admitting: Nurse Practitioner

## 2014-05-19 DIAGNOSIS — K219 Gastro-esophageal reflux disease without esophagitis: Secondary | ICD-10-CM

## 2014-05-19 DIAGNOSIS — R05 Cough: Secondary | ICD-10-CM

## 2014-05-19 DIAGNOSIS — I10 Essential (primary) hypertension: Secondary | ICD-10-CM

## 2014-05-19 DIAGNOSIS — R059 Cough, unspecified: Secondary | ICD-10-CM

## 2014-05-19 NOTE — Progress Notes (Signed)
Patient ID: Nicholas Hood, male   DOB: Aug 03, 1916, 80 y.o.   MRN: [id number]     Allergies  Allergen Reactions  . Accupril [Quinapril Hcl]     Per MAR  . Alacepril     Per MAR  . Altace [Ramipril]     Unknown reaction   . Hctz [Hydrochlorothiazide]     Unknown reaction  . Inderal [Propranolol]     Per MAR  . Mobic [Meloxicam]     Per MAR  . Monopril [Fosinopril]     Per MAR  . Quinapril Hcl     Unknown reaction  . Reserpine     Per [facility]    Chief Complaint  Patient presents with  . Medical Management of Chronic Issues  . Acute Visit    cough.    HPI: Patient is a 80 y.o. male seen in the SNF at [facility] today for cough and chronic medical conditions.     Hospitalized 03/14/2014-03/20/2014 for Delirium-acute resolved, gram-negative rods sepsis UTI urinary tract infection-complete 10 day course of Levaquin SNF.   Problem List Items Addressed This Visit   Essential hypertension, benign     Switch from Losartan to Metoprolol for blood pressure control.     Cough - Primary     05/18/14 CXR bibasilar atelectasis changes, negative for focal pneumonia, Cardiomegaly without evident heart failure findings. Will have Codeine/Guaifenesin prn available to him. Dc Losartan to r/o possible cause of cough. Start Metoprolol for blood pressure control.      GERD (gastroesophageal reflux disease)     Stable takes Omeprazole 20mg  daily.           Review of Systems:  Review of Systems  Constitutional: Negative for fever, chills, weight loss, malaise/fatigue and diaphoresis.  HENT: Positive for hearing loss. Negative for congestion, ear discharge, ear pain, nosebleeds and tinnitus.   Eyes: Negative for blurred vision, double vision, photophobia, pain and discharge.  Respiratory: Positive for cough. Negative for hemoptysis, sputum production, shortness of breath, wheezing and stridor.   Cardiovascular: Negative for chest pain, palpitations, orthopnea, claudication, leg  swelling and PND.  Gastrointestinal: Positive for constipation. Negative for heartburn, nausea, vomiting, abdominal pain, diarrhea and blood in stool.  Genitourinary: Positive for frequency. Negative for dysuria, urgency, hematuria and flank pain.  Musculoskeletal: Negative for back pain, falls, joint pain, myalgias and neck pain.       W/c for mobility. Mechanical lift to get out/in bed  Skin: Positive for itching.       Hx of chronic itching. R heel pressure ulcer-open-a penny size. Coccyx beefy red area.   Neurological: Positive for tremors. Negative for dizziness, tingling, sensory change, speech change, focal weakness, seizures, loss of consciousness, weakness and headaches.       Noted resting tremor in the right fingers  Endo/Heme/Allergies: Positive for environmental allergies. Negative for polydipsia. Bruises/bleeds easily.  Psychiatric/Behavioral: Positive for memory loss. Negative for depression, suicidal ideas, hallucinations and substance abuse. The patient is not nervous/anxious and does not have insomnia.      Past Medical History  Diagnosis Date  . Hypertension   . Prostate cancer   . Spinal stenosis   . Peripheral neuropathy   . Diverticulosis   . Complete heart block     s/p PPM  . Vitamin D deficiency   . Paroxysmal atrial fibrillation   . Congestive heart failure, unspecified 03/05/2012  . Unspecified hypertrophic and atrophic condition of skin 01/16/2012  . Sebaceous cyst 01/16/2012  .  Unspecified constipation 11/28/2011  . Other malaise and fatigue 01/28/2008  . Nonspecific abnormal electrocardiogram (ECG) (EKG) 01/28/2008  . Internal hemorrhoids without mention of complication 3/97/6734  . Unspecified hearing loss 02/19/2007  . Osteoarthrosis, unspecified whether generalized or localized, unspecified site 02/19/2007  . Abnormality of gait 06/04/1999  . Aortic valve disorders 06/04/1979  . Cerebellar atrophy 03/20/14  . Cerebrovascular disease 03/20/14    small vessell  disease  . Urinary tract infection, site not specified 03/14/14  . Delirium 03/14/14  . Sepsis 03/14/14  . Anemia of chronic disease 2014  . Dysphagia 2015   Past Surgical History  Procedure Laterality Date  . Pacemaker insertion  01-04-2010    STJ 2210-RF dual chamber pacemaker implanted by Dr Rayann Heman for CHB  . Back surgery  2000    Spinal stenosis Dr. Achilles Dunk  . Nasal polyp excision    . Tonsillectomy    . Cataract extraction w/ intraocular lens  implant, bilateral  10/2009    Dr. Ellie Lunch  . Pacemaker placement  01/05/2010  . Colonoscopy  07/25/2007    int hem. diverticulosis  Dr. Lajoyce Corners   Social History:   reports that he quit smoking about 76 years ago. He has never used smokeless tobacco. He reports that he does not drink alcohol or use illicit drugs.  Family History  Problem Relation Age of Onset  . Coronary artery disease      family history  . Stroke Mother   . Heart disease Father   . Heart disease Brother   . Heart disease Brother   . Heart disease Brother   . Lung disease Brother     Medications: Patient's Medications  New Prescriptions   No medications on file  Previous Medications   ACETAMINOPHEN (TYLENOL) 325 MG TABLET    Take 650 mg by mouth every 4 (four) hours as needed for fever (temp over 100).   ALBUTEROL (PROVENTIL) (2.5 MG/3ML) 0.083% NEBULIZER SOLUTION    Take 3 mLs (2.5 mg total) by nebulization every 4 (four) hours as needed for wheezing or shortness of breath.   BENZONATATE (TESSALON) 200 MG CAPSULE    Take 1 capsule (200 mg total) by mouth 3 (three) times daily. For cough   CALCIUM CARB-CHOLECALCIFEROL (OYSTER SHELL CALCIUM + D) 500-400 MG-UNIT TABS    Take 1 tablet by mouth daily.   CETIRIZINE (ZYRTEC ALLERGY) 10 MG TABLET    Take 10 mg by mouth daily. To help with itching   CHOLECALCIFEROL (VITAMIN D) 1000 UNITS TABLET    Take 2,000 Units by mouth daily.   DOCUSATE SODIUM 100 MG CAPS    Take 100 mg by mouth 2 (two) times daily.   DOXAZOSIN (CARDURA) 2  MG TABLET    Take 2 mg by mouth daily.   GLUCOSAMINE-CHONDROITIN 500-400 MG CAPS    Take 3 capsules by mouth daily.   LOSARTAN (COZAAR) 50 MG TABLET    Take 50 mg by mouth daily. For Hypertention   OMEPRAZOLE (PRILOSEC) 20 MG CAPSULE    Take 20 mg by mouth daily.   RIVAROXABAN (XARELTO) 15 MG TABS TABLET    Take 15 mg by mouth daily after supper.  Modified Medications   No medications on file  Discontinued Medications   No medications on file     Physical Exam: Physical Exam  Constitutional: He is oriented to person, place, and time. He appears well-developed and well-nourished. No distress.  HENT:  Head: Normocephalic and atraumatic.  Right Ear: External ear  normal.  Left Ear: External ear normal.  Nose: Nose normal.  Mouth/Throat: Oropharynx is clear and moist. No oropharyngeal exudate.  Eyes: Conjunctivae and EOM are normal. Pupils are equal, round, and reactive to light. Right eye exhibits no discharge. Left eye exhibits no discharge. No scleral icterus.  Neck: Normal range of motion. Neck supple. No JVD present. No tracheal deviation present. No thyromegaly present.  Cardiovascular: Normal rate and intact distal pulses.   Murmur heard. A-fib. EM 3/6  Pulmonary/Chest: Effort normal. No stridor. No respiratory distress. He has no wheezes. He has rales. He exhibits no tenderness.  Posterior mid to lower lungs dry rales.   Abdominal: Soft. Bowel sounds are normal. He exhibits no distension. There is no tenderness. There is no rebound and no guarding.  Musculoskeletal: Normal range of motion. He exhibits no edema and no tenderness.  Motor scooter for mobility.   Lymphadenopathy:    He has no cervical adenopathy.  Neurological: He is alert and oriented to person, place, and time. He has normal reflexes. No cranial nerve deficit. He exhibits abnormal muscle tone. Coordination normal.  Reported more slurred speech in pm. No limb weakness or facial droop.   Skin: Skin is dry. No rash  noted. He is not diaphoretic. No erythema. No pallor.  Scratchy marks left shin and right abd.   Psychiatric: He has a normal mood and affect. His behavior is normal. Judgment and thought content normal.  Repetitive. Confusion. Anxious.     Filed Vitals:   05/19/14 1314  BP: 132/58  Pulse: 62  Temp: 98.6 F (37 C)  TempSrc: Tympanic  Resp: 20  SpO2: 97%      Labs reviewed: Basic Metabolic Panel:  Recent Labs  07/08/13 03/13/14  03/16/14 0441 03/17/14 0627 03/19/14 0613 03/27/14 04/22/14 05/18/14  NA 141 138  < > 140 139 138 138 142 135*  K 3.9 4.3  < > 4.2 4.2 4.3 4.4 3.8 4.1  CL  --   --   < > 107 107 105  --   --   --   CO2  --   --   < > 22 23 24   --   --   --   GLUCOSE  --   --   < > 105* 127* 108*  --   --   --   BUN 25* 29*  < > 22 26* 22 25* 19 19  CREATININE 1.2 1.0  < > 0.98 0.97 0.94 1.1 0.8 1.1  CALCIUM  --   --   < > 8.5 8.4 8.6  --   --   --   TSH  --  1.08  --   --   --   --   --   --   --   < > = values in this interval not displayed. Liver Function Tests:  Recent Labs  03/14/14 1800 03/27/14 04/22/14 05/18/14  AST 21 14 14 15   ALT 17 11 9* 11  ALKPHOS 59 42 44 55  BILITOT 0.3  --   --   --   PROT 7.1  --   --   --   ALBUMIN 2.3*  --   --   --    CBC:  Recent Labs  03/14/14 1800  03/16/14 0441 03/17/14 0627 03/19/14 0613 03/27/14 04/22/14 05/18/14  WBC 13.3*  < > 11.1* 10.9* 11.6* 12.1 5.7 4.0  NEUTROABS 11.2*  --   --   --   --   --   --   --  HGB 9.8*  < > 10.6* 11.0* 10.5* 11.6* 11.1* 11.9*  HCT 30.5*  < > 31.7* 34.1* 31.6* 34* 32* 34*  MCV 93.6  < > 93.5 94.2 93.8  --   --   --   PLT 207  < > 248 244 269 318 192 158  < > = values in this interval not displayed.   Past Procedures:  05/18/14 CXR bibasilar atelectasis changes, negative for focal pneumonia, Cardiomegaly without evident heart failure findings.   Assessment/Plan Cough 05/18/14 CXR bibasilar atelectasis changes, negative for focal pneumonia, Cardiomegaly without  evident heart failure findings. Will have Codeine/Guaifenesin prn available to him. Dc Losartan to r/o possible cause of cough. Start Metoprolol for blood pressure control.    GERD (gastroesophageal reflux disease) Stable takes Omeprazole 20mg  daily.      Essential hypertension, benign Switch from Losartan to Metoprolol for blood pressure control.     Family/ Staff Communication: observe the patient.   Goals of Care: SNF  Labs/tests ordered: none.

## 2014-05-19 NOTE — Assessment & Plan Note (Addendum)
05/18/14 CXR bibasilar atelectasis changes, negative for focal pneumonia, Cardiomegaly without evident heart failure findings. Will have Codeine/Guaifenesin prn available to him. Dc Losartan to r/o possible cause of cough. Start Metoprolol for blood pressure control.

## 2014-05-19 NOTE — Assessment & Plan Note (Signed)
Stable takes Omeprazole 20mg  daily.

## 2014-05-19 NOTE — Assessment & Plan Note (Signed)
Switch from Losartan to Metoprolol for blood pressure control.

## 2014-06-12 ENCOUNTER — Non-Acute Institutional Stay (SKILLED_NURSING_FACILITY): Payer: Medicare Other | Admitting: Nurse Practitioner

## 2014-06-12 ENCOUNTER — Encounter: Payer: Self-pay | Admitting: Nurse Practitioner

## 2014-06-12 DIAGNOSIS — L299 Pruritus, unspecified: Secondary | ICD-10-CM

## 2014-06-12 DIAGNOSIS — I10 Essential (primary) hypertension: Secondary | ICD-10-CM

## 2014-06-12 DIAGNOSIS — F4323 Adjustment disorder with mixed anxiety and depressed mood: Secondary | ICD-10-CM

## 2014-06-12 DIAGNOSIS — I442 Atrioventricular block, complete: Secondary | ICD-10-CM

## 2014-06-12 DIAGNOSIS — I4891 Unspecified atrial fibrillation: Secondary | ICD-10-CM

## 2014-06-12 DIAGNOSIS — I482 Chronic atrial fibrillation, unspecified: Secondary | ICD-10-CM

## 2014-06-12 DIAGNOSIS — K219 Gastro-esophageal reflux disease without esophagitis: Secondary | ICD-10-CM

## 2014-06-12 DIAGNOSIS — K5900 Constipation, unspecified: Secondary | ICD-10-CM

## 2014-06-12 NOTE — Assessment & Plan Note (Signed)
chronic itching-red discoloration to knuckles, scratched self until bleed on L shin/R abd. Continue Zyrtec daily

## 2014-06-12 NOTE — Assessment & Plan Note (Signed)
Stable takes Omeprazole 20mg  daily.

## 2014-06-12 NOTE — Assessment & Plan Note (Signed)
Controlled, takes Metoprolol 25mg bid.   

## 2014-06-12 NOTE — Assessment & Plan Note (Signed)
Stable, takes Colace bid.   

## 2014-06-12 NOTE — Assessment & Plan Note (Signed)
Has pacemaker  

## 2014-06-12 NOTE — Assessment & Plan Note (Signed)
Rate controlled. Takes Xarelto for thromboembolic events risk reduction.  

## 2014-06-12 NOTE — Progress Notes (Signed)
Patient ID: Nicholas Hood, male   DOB: 1916/10/22, 80 y.o.   MRN: [id number]     Allergies  Allergen Reactions  . Accupril [Quinapril Hcl]     Per MAR  . Alacepril     Per MAR  . Altace [Ramipril]     Unknown reaction   . Hctz [Hydrochlorothiazide]     Unknown reaction  . Inderal [Propranolol]     Per MAR  . Mobic [Meloxicam]     Per MAR  . Monopril [Fosinopril]     Per MAR  . Quinapril Hcl     Unknown reaction  . Reserpine     Per [facility]    Chief Complaint  Patient presents with  . Medical Management of Chronic Issues    HPI: Patient is a 80 y.o. male seen in the SNF at [facility] today for chronic medical conditions.     Hospitalized 03/14/2014-03/20/2014 for Delirium-acute resolved, gram-negative rods sepsis UTI urinary tract infection-complete 10 day course of Levaquin SNF.   Problem List Items Addressed This Visit   Adjustment disorder with mixed anxiety and depressed mood     Stabilized. Takes lorazepam 0.5mg  qhs and prn also available to him.        Atrial fibrillation     Rate controlled. Takes Xarelto for thromboembolic events risk reduction.      Complete heart block     Has pacemaker.     Essential hypertension, benign     Controlled, takes  Metoprolol 25mg  bid.      GERD (gastroesophageal reflux disease) - Primary     Stable takes Omeprazole 20mg  daily.       Pruritus     chronic itching-red discoloration to knuckles, scratched self until bleed on L shin/R abd. Continue Zyrtec daily        Unspecified constipation     Stable, takes Colace bid.         Review of Systems:  Review of Systems  Constitutional: Negative for fever, chills, weight loss, malaise/fatigue and diaphoresis.  HENT: Positive for hearing loss. Negative for congestion, ear discharge, ear pain, nosebleeds and tinnitus.   Eyes: Negative for blurred vision, double vision, photophobia, pain and discharge.  Respiratory: Positive for cough. Negative for hemoptysis,  sputum production, shortness of breath, wheezing and stridor.   Cardiovascular: Negative for chest pain, palpitations, orthopnea, claudication, leg swelling and PND.  Gastrointestinal: Positive for constipation. Negative for heartburn, nausea, vomiting, abdominal pain, diarrhea and blood in stool.  Genitourinary: Positive for frequency. Negative for dysuria, urgency, hematuria and flank pain.  Musculoskeletal: Negative for back pain, falls, joint pain, myalgias and neck pain.       W/c for mobility. Mechanical lift to get out/in bed  Skin: Positive for itching.       Hx of chronic itching. R heel pressure ulcer-open-a penny size. Coccyx beefy red area.   Neurological: Positive for tremors. Negative for dizziness, tingling, sensory change, speech change, focal weakness, seizures, loss of consciousness, weakness and headaches.       Noted resting tremor in the right fingers  Endo/Heme/Allergies: Positive for environmental allergies. Negative for polydipsia. Bruises/bleeds easily.  Psychiatric/Behavioral: Positive for memory loss. Negative for depression, suicidal ideas, hallucinations and substance abuse. The patient is not nervous/anxious and does not have insomnia.      Past Medical History  Diagnosis Date  . Hypertension   . Prostate cancer   . Spinal stenosis   . Peripheral neuropathy   . Diverticulosis   .  Complete heart block     s/p PPM  . Vitamin D deficiency   . Paroxysmal atrial fibrillation   . Congestive heart failure, unspecified 03/05/2012  . Unspecified hypertrophic and atrophic condition of skin 01/16/2012  . Sebaceous cyst 01/16/2012  . Unspecified constipation 11/28/2011  . Other malaise and fatigue 01/28/2008  . Nonspecific abnormal electrocardiogram (ECG) (EKG) 01/28/2008  . Internal hemorrhoids without mention of complication 07/16/2682  . Unspecified hearing loss 02/19/2007  . Osteoarthrosis, unspecified whether generalized or localized, unspecified site 02/19/2007  .  Abnormality of gait 06/04/1999  . Aortic valve disorders 06/04/1979  . Cerebellar atrophy 03/20/14  . Cerebrovascular disease 03/20/14    small vessell disease  . Urinary tract infection, site not specified 03/14/14  . Delirium 03/14/14  . Sepsis 03/14/14  . Anemia of chronic disease 2014  . Dysphagia 2015   Past Surgical History  Procedure Laterality Date  . Pacemaker insertion  01-04-2010    STJ 2210-RF dual chamber pacemaker implanted by Dr Rayann Heman for CHB  . Back surgery  2000    Spinal stenosis Dr. Achilles Dunk  . Nasal polyp excision    . Tonsillectomy    . Cataract extraction w/ intraocular lens  implant, bilateral  10/2009    Dr. Ellie Lunch  . Pacemaker placement  01/05/2010  . Colonoscopy  07/25/2007    int hem. diverticulosis  Dr. Lajoyce Corners   Social History:   reports that he quit smoking about 76 years ago. He has never used smokeless tobacco. He reports that he does not drink alcohol or use illicit drugs.  Family History  Problem Relation Age of Onset  . Coronary artery disease      family history  . Stroke Mother   . Heart disease Father   . Heart disease Brother   . Heart disease Brother   . Heart disease Brother   . Lung disease Brother     Medications: Patient's Medications  New Prescriptions   No medications on file  Previous Medications   ACETAMINOPHEN (TYLENOL) 325 MG TABLET    Take 650 mg by mouth every 4 (four) hours as needed for fever (temp over 100).   ALBUTEROL (PROVENTIL) (2.5 MG/3ML) 0.083% NEBULIZER SOLUTION    Take 3 mLs (2.5 mg total) by nebulization every 4 (four) hours as needed for wheezing or shortness of breath.   BENZONATATE (TESSALON) 200 MG CAPSULE    Take 1 capsule (200 mg total) by mouth 3 (three) times daily. For cough   CALCIUM CARB-CHOLECALCIFEROL (OYSTER SHELL CALCIUM + D) 500-400 MG-UNIT TABS    Take 1 tablet by mouth daily.   CETIRIZINE (ZYRTEC ALLERGY) 10 MG TABLET    Take 10 mg by mouth daily. To help with itching   CHOLECALCIFEROL (VITAMIN D)  1000 UNITS TABLET    Take 2,000 Units by mouth daily.   DOCUSATE SODIUM 100 MG CAPS    Take 100 mg by mouth 2 (two) times daily.   DOXAZOSIN (CARDURA) 2 MG TABLET    Take 2 mg by mouth daily.   GLUCOSAMINE-CHONDROITIN 500-400 MG CAPS    Take 3 capsules by mouth daily.   LOSARTAN (COZAAR) 50 MG TABLET    Take 50 mg by mouth daily. For Hypertention   OMEPRAZOLE (PRILOSEC) 20 MG CAPSULE    Take 20 mg by mouth daily.   RIVAROXABAN (XARELTO) 15 MG TABS TABLET    Take 15 mg by mouth daily after supper.  Modified Medications   No medications on file  Discontinued Medications   No medications on file     Physical Exam: Physical Exam  Constitutional: He is oriented to person, place, and time. He appears well-developed and well-nourished. No distress.  HENT:  Head: Normocephalic and atraumatic.  Right Ear: External ear normal.  Left Ear: External ear normal.  Nose: Nose normal.  Mouth/Throat: Oropharynx is clear and moist. No oropharyngeal exudate.  Eyes: Conjunctivae and EOM are normal. Pupils are equal, round, and reactive to light. Right eye exhibits no discharge. Left eye exhibits no discharge. No scleral icterus.  Neck: Normal range of motion. Neck supple. No JVD present. No tracheal deviation present. No thyromegaly present.  Cardiovascular: Normal rate and intact distal pulses.   Murmur heard. A-fib. EM 3/6  Pulmonary/Chest: Effort normal. No stridor. No respiratory distress. He has no wheezes. He has rales. He exhibits no tenderness.  Posterior mid to lower lungs dry rales.   Abdominal: Soft. Bowel sounds are normal. He exhibits no distension. There is no tenderness. There is no rebound and no guarding.  Musculoskeletal: Normal range of motion. He exhibits no edema and no tenderness.  Motor scooter for mobility.   Lymphadenopathy:    He has no cervical adenopathy.  Neurological: He is alert and oriented to person, place, and time. He has normal reflexes. No cranial nerve deficit. He  exhibits abnormal muscle tone. Coordination normal.  Reported more slurred speech in pm. No limb weakness or facial droop.   Skin: Skin is dry. No rash noted. He is not diaphoretic. No erythema. No pallor.  Scratchy marks left shin and right abd.   Psychiatric: He has a normal mood and affect. His behavior is normal. Judgment and thought content normal.  Repetitive. Confusion. Anxious.     Filed Vitals:   06/12/14 1156  BP: 126/64  Pulse: 66  Temp: 98.4 F (36.9 C)  TempSrc: Tympanic  Resp: 18      Labs reviewed: Basic Metabolic Panel:  Recent Labs  07/08/13 03/13/14  03/16/14 0441 03/17/14 0627 03/19/14 0613 03/27/14 04/22/14 05/18/14  NA 141 138  < > 140 139 138 138 142 135*  K 3.9 4.3  < > 4.2 4.2 4.3 4.4 3.8 4.1  CL  --   --   < > 107 107 105  --   --   --   CO2  --   --   < > 22 23 24   --   --   --   GLUCOSE  --   --   < > 105* 127* 108*  --   --   --   BUN 25* 29*  < > 22 26* 22 25* 19 19  CREATININE 1.2 1.0  < > 0.98 0.97 0.94 1.1 0.8 1.1  CALCIUM  --   --   < > 8.5 8.4 8.6  --   --   --   TSH  --  1.08  --   --   --   --   --   --   --   < > = values in this interval not displayed. Liver Function Tests:  Recent Labs  03/14/14 1800 03/27/14 04/22/14 05/18/14  AST 21 14 14 15   ALT 17 11 9* 11  ALKPHOS 59 42 44 55  BILITOT 0.3  --   --   --   PROT 7.1  --   --   --   ALBUMIN 2.3*  --   --   --    CBC:  Recent Labs  03/14/14 1800  03/16/14 0441 03/17/14 0627 03/19/14 0613 03/27/14 04/22/14 05/18/14  WBC 13.3*  < > 11.1* 10.9* 11.6* 12.1 5.7 4.0  NEUTROABS 11.2*  --   --   --   --   --   --   --   HGB 9.8*  < > 10.6* 11.0* 10.5* 11.6* 11.1* 11.9*  HCT 30.5*  < > 31.7* 34.1* 31.6* 34* 32* 34*  MCV 93.6  < > 93.5 94.2 93.8  --   --   --   PLT 207  < > 248 244 269 318 192 158  < > = values in this interval not displayed.   Past Procedures:  05/18/14 CXR bibasilar atelectasis changes, negative for focal pneumonia, Cardiomegaly without evident heart  failure findings.   Assessment/Plan GERD (gastroesophageal reflux disease) Stable takes Omeprazole 20mg  daily.     Essential hypertension, benign Controlled, takes  Metoprolol 25mg  bid.    Adjustment disorder with mixed anxiety and depressed mood Stabilized. Takes lorazepam 0.5mg  qhs and prn also available to him.      Atrial fibrillation Rate controlled. Takes Xarelto for thromboembolic events risk reduction.    Complete heart block Has pacemaker.   Unspecified constipation Stable, takes Colace bid.    Pruritus chronic itching-red discoloration to knuckles, scratched self until bleed on L shin/R abd. Continue Zyrtec daily        Family/ Staff Communication: observe the patient.   Goals of Care: SNF  Labs/tests ordered: none.

## 2014-06-12 NOTE — Assessment & Plan Note (Signed)
Stabilized. Takes lorazepam 0.5mg  qhs and prn also available to him.

## 2014-06-17 ENCOUNTER — Telehealth: Payer: Self-pay | Admitting: *Deleted

## 2014-06-17 NOTE — Telephone Encounter (Signed)
Attempted to call patient per Dr. Rayann Heman re: increase of A-fib.  Phone number listed is not in service.

## 2014-06-18 ENCOUNTER — Encounter: Payer: Self-pay | Admitting: Internal Medicine

## 2014-06-22 ENCOUNTER — Encounter: Payer: Self-pay | Admitting: Internal Medicine

## 2014-06-26 ENCOUNTER — Encounter: Payer: Self-pay | Admitting: Nurse Practitioner

## 2014-06-26 ENCOUNTER — Non-Acute Institutional Stay (SKILLED_NURSING_FACILITY): Payer: Medicare Other | Admitting: Nurse Practitioner

## 2014-06-26 DIAGNOSIS — L89899 Pressure ulcer of other site, unspecified stage: Secondary | ICD-10-CM

## 2014-06-26 DIAGNOSIS — I482 Chronic atrial fibrillation, unspecified: Secondary | ICD-10-CM

## 2014-06-26 DIAGNOSIS — I4891 Unspecified atrial fibrillation: Secondary | ICD-10-CM

## 2014-06-26 DIAGNOSIS — I10 Essential (primary) hypertension: Secondary | ICD-10-CM

## 2014-06-26 DIAGNOSIS — F4323 Adjustment disorder with mixed anxiety and depressed mood: Secondary | ICD-10-CM

## 2014-06-26 DIAGNOSIS — L299 Pruritus, unspecified: Secondary | ICD-10-CM

## 2014-06-26 DIAGNOSIS — L89891 Pressure ulcer of other site, stage 1: Secondary | ICD-10-CM

## 2014-06-26 DIAGNOSIS — K219 Gastro-esophageal reflux disease without esophagitis: Secondary | ICD-10-CM

## 2014-06-26 DIAGNOSIS — L8991 Pressure ulcer of unspecified site, stage 1: Secondary | ICD-10-CM

## 2014-06-26 DIAGNOSIS — K5900 Constipation, unspecified: Secondary | ICD-10-CM

## 2014-06-26 NOTE — Progress Notes (Signed)
Patient ID: Nicholas Hood, male   DOB: [birth year], 80 y.o.   MRN: [id number]     Allergies  Allergen Reactions  . Accupril [Quinapril Hcl]     Per MAR  . Alacepril     Per MAR  . Altace [Ramipril]     Unknown reaction   . Hctz [Hydrochlorothiazide]     Unknown reaction  . Inderal [Propranolol]     Per MAR  . Mobic [Meloxicam]     Per MAR  . Monopril [Fosinopril]     Per MAR  . Quinapril Hcl     Unknown reaction  . Reserpine     Per [facility]    Chief Complaint  Patient presents with  . Medical Management of Chronic Issues  . Acute Visit    left toes pressure areas.     HPI: Patient is a 80 y.o. male seen in the SNF at [facility] today for pressure areas on the top of the left toes and chronic medical conditions.     Hospitalized 03/14/2014-03/20/2014 for Delirium-acute resolved, gram-negative rods sepsis UTI urinary tract infection-complete 10 day course of Levaquin SNF.   Problem List Items Addressed This Visit   Adjustment disorder with mixed anxiety and depressed mood - Primary     Stabilized. Takes lorazepam 0.5mg  qhs and prn also available to him.        Atrial fibrillation     Rate controlled. Takes Xarelto for thromboembolic events risk reduction.       Essential hypertension, benign     Controlled, takes  Metoprolol 25mg  bid.      GERD (gastroesophageal reflux disease)     Stable takes Omeprazole 20mg  daily.        Pressure ulcer of toe, stage 1     Reddened area on the top of the left toes-pressure reduction and recommend open toe shoes.     Pruritus     chronic itching-red discoloration to knuckles, scratched self until bleed on L shin/R abd. Continue Zyrtec daily       Unspecified constipation     Stable, takes Colace bid.          Review of Systems:  Review of Systems  Constitutional: Negative for fever, chills, weight loss, malaise/fatigue and diaphoresis.  HENT: Positive for hearing loss. Negative for congestion, ear  discharge, ear pain, nosebleeds and tinnitus.   Eyes: Negative for blurred vision, double vision, photophobia, pain and discharge.  Respiratory: Positive for cough. Negative for hemoptysis, sputum production, shortness of breath, wheezing and stridor.   Cardiovascular: Negative for chest pain, palpitations, orthopnea, claudication, leg swelling and PND.  Gastrointestinal: Positive for constipation. Negative for heartburn, nausea, vomiting, abdominal pain, diarrhea and blood in stool.  Genitourinary: Positive for frequency. Negative for dysuria, urgency, hematuria and flank pain.  Musculoskeletal: Negative for back pain, falls, joint pain, myalgias and neck pain.       W/c for mobility. Mechanical lift to get out/in bed  Skin: Positive for itching.       Hx of chronic itching. R heel pressure ulcer-open-a penny size-healed. Coccyx beefy red area-healed. New pressure areas on the top of the left toes-no open area, un blanchable redness.   Neurological: Positive for tremors. Negative for dizziness, tingling, sensory change, speech change, focal weakness, seizures, loss of consciousness, weakness and headaches.       Noted resting tremor in the right fingers  Endo/Heme/Allergies: Positive for environmental allergies. Negative for polydipsia. Bruises/bleeds easily.  Psychiatric/Behavioral: Positive for  memory loss. Negative for depression, suicidal ideas, hallucinations and substance abuse. The patient is not nervous/anxious and does not have insomnia.      Past Medical History  Diagnosis Date  . Hypertension   . Prostate cancer   . Spinal stenosis   . Peripheral neuropathy   . Diverticulosis   . Complete heart block     s/p PPM  . Vitamin D deficiency   . Paroxysmal atrial fibrillation   . Congestive heart failure, unspecified 03/05/2012  . Unspecified hypertrophic and atrophic condition of skin 01/16/2012  . Sebaceous cyst 01/16/2012  . Unspecified constipation 11/28/2011  . Other malaise and  fatigue 01/28/2008  . Nonspecific abnormal electrocardiogram (ECG) (EKG) 01/28/2008  . Internal hemorrhoids without mention of complication 7/82/9562  . Unspecified hearing loss 02/19/2007  . Osteoarthrosis, unspecified whether generalized or localized, unspecified site 02/19/2007  . Abnormality of gait 06/04/1999  . Aortic valve disorders 06/04/1979  . Cerebellar atrophy 03/20/14  . Cerebrovascular disease 03/20/14    small vessell disease  . Urinary tract infection, site not specified 03/14/14  . Delirium 03/14/14  . Sepsis 03/14/14  . Anemia of chronic disease 2014  . Dysphagia 2015   Past Surgical History  Procedure Laterality Date  . Pacemaker insertion  01-04-2010    STJ 2210-RF dual chamber pacemaker implanted by Dr Rayann Heman for CHB  . Back surgery  2000    Spinal stenosis Dr. Achilles Dunk  . Nasal polyp excision    . Tonsillectomy    . Cataract extraction w/ intraocular lens  implant, bilateral  10/2009    Dr. Ellie Lunch  . Pacemaker placement  01/05/2010  . Colonoscopy  07/25/2007    int hem. diverticulosis  Dr. Lajoyce Corners   Social History:   reports that he quit smoking about 76 years ago. He has never used smokeless tobacco. He reports that he does not drink alcohol or use illicit drugs.  Family History  Problem Relation Age of Onset  . Coronary artery disease      family history  . Stroke Mother   . Heart disease Father   . Heart disease Brother   . Heart disease Brother   . Heart disease Brother   . Lung disease Brother     Medications: Patient's Medications  New Prescriptions   No medications on file  Previous Medications   ACETAMINOPHEN (TYLENOL) 325 MG TABLET    Take 650 mg by mouth every 4 (four) hours as needed for fever (temp over 100).   ALBUTEROL (PROVENTIL) (2.5 MG/3ML) 0.083% NEBULIZER SOLUTION    Take 3 mLs (2.5 mg total) by nebulization every 4 (four) hours as needed for wheezing or shortness of breath.   BENZONATATE (TESSALON) 200 MG CAPSULE    Take 1 capsule (200 mg total)  by mouth 3 (three) times daily. For cough   CALCIUM CARB-CHOLECALCIFEROL (OYSTER SHELL CALCIUM + D) 500-400 MG-UNIT TABS    Take 1 tablet by mouth daily.   CETIRIZINE (ZYRTEC ALLERGY) 10 MG TABLET    Take 10 mg by mouth daily. To help with itching   CHOLECALCIFEROL (VITAMIN D) 1000 UNITS TABLET    Take 2,000 Units by mouth daily.   DOCUSATE SODIUM 100 MG CAPS    Take 100 mg by mouth 2 (two) times daily.   DOXAZOSIN (CARDURA) 2 MG TABLET    Take 2 mg by mouth daily.   GLUCOSAMINE-CHONDROITIN 500-400 MG CAPS    Take 3 capsules by mouth daily.   LOSARTAN (COZAAR) 50 MG  TABLET    Take 50 mg by mouth daily. For Hypertention   OMEPRAZOLE (PRILOSEC) 20 MG CAPSULE    Take 20 mg by mouth daily.   RIVAROXABAN (XARELTO) 15 MG TABS TABLET    Take 15 mg by mouth daily after supper.  Modified Medications   No medications on file  Discontinued Medications   No medications on file     Physical Exam: Physical Exam  Constitutional: He is oriented to person, place, and time. He appears well-developed and well-nourished. No distress.  HENT:  Head: Normocephalic and atraumatic.  Right Ear: External ear normal.  Left Ear: External ear normal.  Nose: Nose normal.  Mouth/Throat: Oropharynx is clear and moist. No oropharyngeal exudate.  Eyes: Conjunctivae and EOM are normal. Pupils are equal, round, and reactive to light. Right eye exhibits no discharge. Left eye exhibits no discharge. No scleral icterus.  Neck: Normal range of motion. Neck supple. No JVD present. No tracheal deviation present. No thyromegaly present.  Cardiovascular: Normal rate and intact distal pulses.   Murmur heard. A-fib. EM 3/6  Pulmonary/Chest: Effort normal. No stridor. No respiratory distress. He has no wheezes. He has rales. He exhibits no tenderness.  Posterior mid to lower lungs dry rales.   Abdominal: Soft. Bowel sounds are normal. He exhibits no distension. There is no tenderness. There is no rebound and no guarding.    Musculoskeletal: Normal range of motion. He exhibits no edema and no tenderness.  Motor scooter for mobility.   Lymphadenopathy:    He has no cervical adenopathy.  Neurological: He is alert and oriented to person, place, and time. He has normal reflexes. No cranial nerve deficit. He exhibits abnormal muscle tone. Coordination normal.  Reported more slurred speech in pm. No limb weakness or facial droop.   Skin: Skin is dry. No rash noted. He is not diaphoretic. No erythema. No pallor.  Scratchy marks left shin and right abd. New reddened areas on the top of the left toes.   Psychiatric: He has a normal mood and affect. His behavior is normal. Judgment and thought content normal.  Repetitive. Confusion. Anxious.     Filed Vitals:   06/26/14 1411  BP: 128/76  Pulse: 69  Temp: 97 F (36.1 C)  TempSrc: Tympanic  Resp: 18      Labs reviewed: Basic Metabolic Panel:  Recent Labs  07/08/13 03/13/14  03/16/14 0441 03/17/14 0627 03/19/14 0613 03/27/14 04/22/14 05/18/14  NA 141 138  < > 140 139 138 138 142 135*  K 3.9 4.3  < > 4.2 4.2 4.3 4.4 3.8 4.1  CL  --   --   < > 107 107 105  --   --   --   CO2  --   --   < > 22 23 24   --   --   --   GLUCOSE  --   --   < > 105* 127* 108*  --   --   --   BUN 25* 29*  < > 22 26* 22 25* 19 19  CREATININE 1.2 1.0  < > 0.98 0.97 0.94 1.1 0.8 1.1  CALCIUM  --   --   < > 8.5 8.4 8.6  --   --   --   TSH  --  1.08  --   --   --   --   --   --   --   < > = values in this interval not displayed. Liver  Function Tests:  Recent Labs  03/14/14 1800 03/27/14 04/22/14 05/18/14  AST 21 14 14 15   ALT 17 11 9* 11  ALKPHOS 59 42 44 55  BILITOT 0.3  --   --   --   PROT 7.1  --   --   --   ALBUMIN 2.3*  --   --   --    CBC:  Recent Labs  03/14/14 1800  03/16/14 0441 03/17/14 0627 03/19/14 0613 03/27/14 04/22/14 05/18/14  WBC 13.3*  < > 11.1* 10.9* 11.6* 12.1 5.7 4.0  NEUTROABS 11.2*  --   --   --   --   --   --   --   HGB 9.8*  < > 10.6* 11.0*  10.5* 11.6* 11.1* 11.9*  HCT 30.5*  < > 31.7* 34.1* 31.6* 34* 32* 34*  MCV 93.6  < > 93.5 94.2 93.8  --   --   --   PLT 207  < > 248 244 269 318 192 158  < > = values in this interval not displayed.   Past Procedures:  05/18/14 CXR bibasilar atelectasis changes, negative for focal pneumonia, Cardiomegaly without evident heart failure findings.   Assessment/Plan Adjustment disorder with mixed anxiety and depressed mood Stabilized. Takes lorazepam 0.5mg  qhs and prn also available to him.      Atrial fibrillation Rate controlled. Takes Xarelto for thromboembolic events risk reduction.     Essential hypertension, benign Controlled, takes  Metoprolol 25mg  bid.    GERD (gastroesophageal reflux disease) Stable takes Omeprazole 20mg  daily.      Unspecified constipation Stable, takes Colace bid.     Pruritus chronic itching-red discoloration to knuckles, scratched self until bleed on L shin/R abd. Continue Zyrtec daily     Pressure ulcer of toe, stage 1 Reddened area on the top of the left toes-pressure reduction and recommend open toe shoes.     Family/ Staff Communication: observe the patient.   Goals of Care: SNF  Labs/tests ordered: none.

## 2014-07-06 ENCOUNTER — Encounter: Payer: Self-pay | Admitting: Nurse Practitioner

## 2014-07-06 DIAGNOSIS — L89891 Pressure ulcer of other site, stage 1: Secondary | ICD-10-CM | POA: Insufficient documentation

## 2014-07-06 NOTE — Assessment & Plan Note (Signed)
Stabilized. Takes lorazepam 0.5mg  qhs and prn also available to him.

## 2014-07-06 NOTE — Assessment & Plan Note (Signed)
Controlled, takes Metoprolol 25mg bid.   

## 2014-07-06 NOTE — Assessment & Plan Note (Signed)
Stable takes Omeprazole 20mg  daily.

## 2014-07-06 NOTE — Assessment & Plan Note (Signed)
Stable, takes Colace bid.   

## 2014-07-06 NOTE — Assessment & Plan Note (Signed)
chronic itching-red discoloration to knuckles, scratched self until bleed on L shin/R abd. Continue Zyrtec daily

## 2014-07-06 NOTE — Assessment & Plan Note (Signed)
Rate controlled. Takes Xarelto for thromboembolic events risk reduction.  

## 2014-07-06 NOTE — Assessment & Plan Note (Signed)
Reddened area on the top of the left toes-pressure reduction and recommend open toe shoes.

## 2014-07-15 ENCOUNTER — Ambulatory Visit (INDEPENDENT_AMBULATORY_CARE_PROVIDER_SITE_OTHER): Payer: Medicare Other | Admitting: *Deleted

## 2014-07-15 DIAGNOSIS — I441 Atrioventricular block, second degree: Secondary | ICD-10-CM

## 2014-07-15 NOTE — Progress Notes (Signed)
Remote pacemaker transmission.   

## 2014-07-17 LAB — MDC_IDC_ENUM_SESS_TYPE_REMOTE
Brady Statistic AP VP Percent: 34 %
Brady Statistic AP VS Percent: 1 %
Brady Statistic AS VP Percent: 65 %
Brady Statistic RA Percent Paced: 14 %
Date Time Interrogation Session: 20150923070419
Implantable Pulse Generator Serial Number: 7126761
Lead Channel Impedance Value: 610 Ohm
Lead Channel Pacing Threshold Pulse Width: 0.5 ms
Lead Channel Sensing Intrinsic Amplitude: 12 mV
Lead Channel Setting Pacing Amplitude: 1.125
Lead Channel Setting Pacing Amplitude: 2 V
Lead Channel Setting Pacing Pulse Width: 0.5 ms
Lead Channel Setting Sensing Sensitivity: 2.5 mV
MDC IDC MSMT BATTERY REMAINING LONGEVITY: 58 mo
MDC IDC MSMT BATTERY REMAINING PERCENTAGE: 48 %
MDC IDC MSMT BATTERY VOLTAGE: 2.89 V
MDC IDC MSMT LEADCHNL RA IMPEDANCE VALUE: 360 Ohm
MDC IDC MSMT LEADCHNL RA PACING THRESHOLD AMPLITUDE: 1 V
MDC IDC MSMT LEADCHNL RA PACING THRESHOLD PULSEWIDTH: 0.5 ms
MDC IDC MSMT LEADCHNL RA SENSING INTR AMPL: 3.8 mV
MDC IDC MSMT LEADCHNL RV PACING THRESHOLD AMPLITUDE: 0.875 V
MDC IDC STAT BRADY AS VS PERCENT: 1 %
MDC IDC STAT BRADY RV PERCENT PACED: 99 %

## 2014-07-27 ENCOUNTER — Encounter: Payer: Self-pay | Admitting: Cardiology

## 2014-07-30 ENCOUNTER — Encounter: Payer: Self-pay | Admitting: Internal Medicine

## 2014-08-13 LAB — CBC AND DIFFERENTIAL
HCT: 38 % — AB (ref 41–53)
Hemoglobin: 12.5 g/dL — AB (ref 13.5–17.5)
Platelets: 245 K/µL (ref 150–399)
WBC: 7.8 10*3/mL

## 2014-08-13 LAB — BASIC METABOLIC PANEL WITH GFR
BUN: 22 mg/dL — AB (ref 4–21)
Creatinine: 0.9 mg/dL (ref 0.6–1.3)
Glucose: 153 mg/dL
Potassium: 3.9 mmol/L (ref 3.4–5.3)
Sodium: 140 mmol/L (ref 137–147)

## 2014-08-14 ENCOUNTER — Non-Acute Institutional Stay (SKILLED_NURSING_FACILITY): Payer: Medicare Other | Admitting: Nurse Practitioner

## 2014-08-14 ENCOUNTER — Encounter: Payer: Self-pay | Admitting: Nurse Practitioner

## 2014-08-14 DIAGNOSIS — R319 Hematuria, unspecified: Secondary | ICD-10-CM

## 2014-08-14 DIAGNOSIS — D638 Anemia in other chronic diseases classified elsewhere: Secondary | ICD-10-CM

## 2014-08-14 DIAGNOSIS — N390 Urinary tract infection, site not specified: Secondary | ICD-10-CM

## 2014-08-14 DIAGNOSIS — K5900 Constipation, unspecified: Secondary | ICD-10-CM

## 2014-08-14 DIAGNOSIS — I10 Essential (primary) hypertension: Secondary | ICD-10-CM

## 2014-08-14 DIAGNOSIS — F4323 Adjustment disorder with mixed anxiety and depressed mood: Secondary | ICD-10-CM

## 2014-08-14 DIAGNOSIS — I480 Paroxysmal atrial fibrillation: Secondary | ICD-10-CM

## 2014-08-14 NOTE — Assessment & Plan Note (Signed)
Stabilized. Takes lorazepam 0.5mg  qhs and prn also available to him.

## 2014-08-14 NOTE — Assessment & Plan Note (Signed)
Controlled, takes Metoprolol 25mg bid.   

## 2014-08-14 NOTE — Assessment & Plan Note (Signed)
08/12/14 Hgb 12.5   

## 2014-08-14 NOTE — Assessment & Plan Note (Signed)
Rate controlled. Takes Xarelto for thromboembolic events risk reduction.  

## 2014-08-14 NOTE — Assessment & Plan Note (Signed)
Stable, takes Colace bid.   

## 2014-08-14 NOTE — Assessment & Plan Note (Addendum)
05/07/14 Urine culture E. Coli>100,000c/ml, Septra DS bid x 10days.  08/13/14 Urine culture gram negative rods>100,000c/ml. Septra DS bid x 10days. Hematuria x1

## 2014-08-14 NOTE — Progress Notes (Signed)
Patient ID: Nicholas Hood, male   DOB: 1916-08-06, 80 y.o.   MRN: [id number]     Allergies  Allergen Reactions  . Accupril [Quinapril Hcl]     Per MAR  . Alacepril     Per MAR  . Altace [Ramipril]     Unknown reaction   . Hctz [Hydrochlorothiazide]     Unknown reaction  . Inderal [Propranolol]     Per MAR  . Mobic [Meloxicam]     Per MAR  . Monopril [Fosinopril]     Per MAR  . Quinapril Hcl     Unknown reaction  . Reserpine     Per [facility]    Chief Complaint  Patient presents with  . Medical Management of Chronic Issues  . Acute Visit    lethargy    HPI: Patient is a 80 y.o. male seen in the SNF at [facility] today for lethargy, hematuria x1,  and chronic medical conditions.     Hospitalized 03/14/2014-03/20/2014 for Delirium-acute resolved, gram-negative rods sepsis UTI urinary tract infection-complete 10 day course of Levaquin SNF.   Problem List Items Addressed This Visit   UTI (urinary tract infection) - Primary     05/07/14 Urine culture E. Coli>100,000c/ml, Septra DS bid x 10days.  08/13/14 Urine culture gram negative rods>100,000c/ml. Septra DS bid x 10days. Hematuria x1     Essential hypertension, benign     Controlled, takes  Metoprolol 25mg  bid.       Constipation     Stable, takes Colace bid.       Atrial fibrillation     Rate controlled. Takes Xarelto for thromboembolic events risk reduction.       Anemia of chronic disease     08/12/14 Hgb 12.5    Adjustment disorder with mixed anxiety and depressed mood     Stabilized. Takes lorazepam 0.5mg  qhs and prn also available to him.            Review of Systems:  Review of Systems  Constitutional: Positive for malaise/fatigue. Negative for fever, chills, weight loss and diaphoresis.       Lethargy.  HENT: Positive for hearing loss. Negative for congestion, ear discharge, ear pain, nosebleeds and tinnitus.   Eyes: Negative for blurred vision, double vision, photophobia, pain and  discharge.  Respiratory: Positive for cough. Negative for hemoptysis, sputum production, shortness of breath, wheezing and stridor.   Cardiovascular: Negative for chest pain, palpitations, orthopnea, claudication, leg swelling and PND.  Gastrointestinal: Positive for constipation. Negative for heartburn, nausea, vomiting, abdominal pain, diarrhea and blood in stool.  Genitourinary: Positive for frequency and hematuria. Negative for dysuria, urgency and flank pain.       X1 08/12/14  Musculoskeletal: Negative for back pain, falls, joint pain, myalgias and neck pain.       W/c for mobility. Mechanical lift to get out/in bed  Skin: Positive for itching.       Hx of chronic itching. R heel pressure ulcer-open-a penny size-healed. Coccyx beefy red area-healed. New pressure areas on the top of the left toes-no open area, un blanchable redness.   Neurological: Positive for tremors. Negative for dizziness, tingling, sensory change, speech change, focal weakness, seizures, loss of consciousness, weakness and headaches.       Noted resting tremor in the right fingers  Endo/Heme/Allergies: Positive for environmental allergies. Negative for polydipsia. Bruises/bleeds easily.  Psychiatric/Behavioral: Positive for memory loss. Negative for depression, suicidal ideas, hallucinations and substance abuse. The patient is not nervous/anxious  and does not have insomnia.      Past Medical History  Diagnosis Date  . Hypertension   . Prostate cancer   . Spinal stenosis   . Peripheral neuropathy   . Diverticulosis   . Complete heart block     s/p PPM  . Vitamin D deficiency   . Paroxysmal atrial fibrillation   . Congestive heart failure, unspecified 03/05/2012  . Unspecified hypertrophic and atrophic condition of skin 01/16/2012  . Sebaceous cyst 01/16/2012  . Unspecified constipation 11/28/2011  . Other malaise and fatigue 01/28/2008  . Nonspecific abnormal electrocardiogram (ECG) (EKG) 01/28/2008  . Internal  hemorrhoids without mention of complication 5/70/1779  . Unspecified hearing loss 02/19/2007  . Osteoarthrosis, unspecified whether generalized or localized, unspecified site 02/19/2007  . Abnormality of gait 06/04/1999  . Aortic valve disorders 06/04/1979  . Cerebellar atrophy 03/20/14  . Cerebrovascular disease 03/20/14    small vessell disease  . Urinary tract infection, site not specified 03/14/14  . Delirium 03/14/14  . Sepsis 03/14/14  . Anemia of chronic disease 2014  . Dysphagia 2015   Past Surgical History  Procedure Laterality Date  . Pacemaker insertion  01-04-2010    STJ 2210-RF dual chamber pacemaker implanted by Dr Rayann Heman for CHB  . Back surgery  2000    Spinal stenosis Dr. Achilles Dunk  . Nasal polyp excision    . Tonsillectomy    . Cataract extraction w/ intraocular lens  implant, bilateral  10/2009    Dr. Ellie Lunch  . Pacemaker placement  01/05/2010  . Colonoscopy  07/25/2007    int hem. diverticulosis  Dr. Lajoyce Corners   Social History:   reports that he quit smoking about 76 years ago. He has never used smokeless tobacco. He reports that he does not drink alcohol or use illicit drugs.  Family History  Problem Relation Age of Onset  . Coronary artery disease      family history  . Stroke Mother   . Heart disease Father   . Heart disease Brother   . Heart disease Brother   . Heart disease Brother   . Lung disease Brother     Medications: Patient's Medications  New Prescriptions   No medications on file  Previous Medications   ACETAMINOPHEN (TYLENOL) 325 MG TABLET    Take 650 mg by mouth every 4 (four) hours as needed for fever (temp over 100).   ALBUTEROL (PROVENTIL) (2.5 MG/3ML) 0.083% NEBULIZER SOLUTION    Take 3 mLs (2.5 mg total) by nebulization every 4 (four) hours as needed for wheezing or shortness of breath.   BENZONATATE (TESSALON) 200 MG CAPSULE    Take 1 capsule (200 mg total) by mouth 3 (three) times daily. For cough   CALCIUM CARB-CHOLECALCIFEROL (OYSTER SHELL  CALCIUM + D) 500-400 MG-UNIT TABS    Take 1 tablet by mouth daily.   CETIRIZINE (ZYRTEC ALLERGY) 10 MG TABLET    Take 10 mg by mouth daily. To help with itching   CHOLECALCIFEROL (VITAMIN D) 1000 UNITS TABLET    Take 2,000 Units by mouth daily.   DOCUSATE SODIUM 100 MG CAPS    Take 100 mg by mouth 2 (two) times daily.   DOXAZOSIN (CARDURA) 2 MG TABLET    Take 2 mg by mouth daily.   GLUCOSAMINE-CHONDROITIN 500-400 MG CAPS    Take 3 capsules by mouth daily.   LOSARTAN (COZAAR) 50 MG TABLET    Take 50 mg by mouth daily. For Hypertention   OMEPRAZOLE (PRILOSEC)  20 MG CAPSULE    Take 20 mg by mouth daily.   RIVAROXABAN (XARELTO) 15 MG TABS TABLET    Take 15 mg by mouth daily after supper.  Modified Medications   No medications on file  Discontinued Medications   No medications on file     Physical Exam: Physical Exam  Constitutional: He is oriented to person, place, and time. He appears well-developed and well-nourished. No distress.  HENT:  Head: Normocephalic and atraumatic.  Right Ear: External ear normal.  Left Ear: External ear normal.  Nose: Nose normal.  Mouth/Throat: Oropharynx is clear and moist. No oropharyngeal exudate.  Eyes: Conjunctivae and EOM are normal. Pupils are equal, round, and reactive to light. Right eye exhibits no discharge. Left eye exhibits no discharge. No scleral icterus.  Neck: Normal range of motion. Neck supple. No JVD present. No tracheal deviation present. No thyromegaly present.  Cardiovascular: Normal rate and intact distal pulses.   Murmur heard. A-fib. EM 3/6  Pulmonary/Chest: Effort normal. No stridor. No respiratory distress. He has no wheezes. He has rales. He exhibits no tenderness.  Posterior mid to lower lungs dry rales.   Abdominal: Soft. Bowel sounds are normal. He exhibits no distension. There is no tenderness. There is no rebound and no guarding.  Musculoskeletal: Normal range of motion. He exhibits no edema and no tenderness.  Motor  scooter for mobility.   Lymphadenopathy:    He has no cervical adenopathy.  Neurological: He is alert and oriented to person, place, and time. He has normal reflexes. No cranial nerve deficit. He exhibits abnormal muscle tone. Coordination normal.  Reported more slurred speech in pm. No limb weakness or facial droop.   Skin: Skin is dry. No rash noted. He is not diaphoretic. No erythema. No pallor.  Scratchy marks left shin and right abd. New reddened areas on the top of the left toes.   Psychiatric: He has a normal mood and affect. His behavior is normal. Judgment and thought content normal.  Repetitive. Confusion. Anxious.     Filed Vitals:   08/14/14 1426  BP: 104/65  Pulse: 70  Temp: 98.5 F (36.9 C)  TempSrc: Tympanic  Resp: 18      Labs reviewed: Basic Metabolic Panel:  Recent Labs  03/13/14  03/16/14 0441 03/17/14 0627 03/19/14 0613  04/22/14 05/18/14 08/13/14  NA 138  < > 140 139 138  < > 142 135* 140  K 4.3  < > 4.2 4.2 4.3  < > 3.8 4.1 3.9  CL  --   < > 107 107 105  --   --   --   --   CO2  --   < > 22 23 24   --   --   --   --   GLUCOSE  --   < > 105* 127* 108*  --   --   --   --   BUN 29*  < > 22 26* 22  < > 19 19 22*  CREATININE 1.0  < > 0.98 0.97 0.94  < > 0.8 1.1 0.9  CALCIUM  --   < > 8.5 8.4 8.6  --   --   --   --   TSH 1.08  --   --   --   --   --   --   --   --   < > = values in this interval not displayed. Liver Function Tests:  Recent Labs  03/14/14 1800 03/27/14  04/22/14 05/18/14  AST 21 14 14 15   ALT 17 11 9* 11  ALKPHOS 59 42 44 55  BILITOT 0.3  --   --   --   PROT 7.1  --   --   --   ALBUMIN 2.3*  --   --   --    CBC:  Recent Labs  03/14/14 1800  03/16/14 0441 03/17/14 0627 03/19/14 0613  04/22/14 05/18/14 08/13/14  WBC 13.3*  < > 11.1* 10.9* 11.6*  < > 5.7 4.0 7.8  NEUTROABS 11.2*  --   --   --   --   --   --   --   --   HGB 9.8*  < > 10.6* 11.0* 10.5*  < > 11.1* 11.9* 12.5*  HCT 30.5*  < > 31.7* 34.1* 31.6*  < > 32* 34* 38*    MCV 93.6  < > 93.5 94.2 93.8  --   --   --   --   PLT 207  < > 248 244 269  < > 192 158 245  < > = values in this interval not displayed.   Past Procedures:  05/18/14 CXR bibasilar atelectasis changes, negative for focal pneumonia, Cardiomegaly without evident heart failure findings.   08/12/14 US bladder: very large prostate, no massive distension of the urinary bladder is present, 6.4cmx6.4cmx7.1cm  Assessment/Plan UTI (urinary tract infection) 05/07/14 Urine culture E. Coli>100,000c/ml, Septra DS bid x 10days.  08/13/14 Urine culture gram negative rods>100,000c/ml. Septra DS bid x 10days. Hematuria x1   Constipation Stable, takes Colace bid.     Essential hypertension, benign Controlled, takes  Metoprolol 25mg  bid.     Atrial fibrillation Rate controlled. Takes Xarelto for thromboembolic events risk reduction.     Adjustment disorder with mixed anxiety and depressed mood Stabilized. Takes lorazepam 0.5mg  qhs and prn also available to him.       Anemia of chronic disease 08/12/14 Hgb 12.5    Family/ Staff Communication: observe the patient.   Goals of Care: SNF  Labs/tests ordered: urine culture, CBC, BMP, US bladder done 08/12/14

## 2014-08-18 ENCOUNTER — Encounter: Payer: Self-pay | Admitting: Nurse Practitioner

## 2014-08-18 ENCOUNTER — Non-Acute Institutional Stay (SKILLED_NURSING_FACILITY): Payer: Medicare Other | Admitting: Nurse Practitioner

## 2014-08-18 DIAGNOSIS — F4323 Adjustment disorder with mixed anxiety and depressed mood: Secondary | ICD-10-CM

## 2014-08-18 DIAGNOSIS — D638 Anemia in other chronic diseases classified elsewhere: Secondary | ICD-10-CM

## 2014-08-18 DIAGNOSIS — I10 Essential (primary) hypertension: Secondary | ICD-10-CM

## 2014-08-18 DIAGNOSIS — R319 Hematuria, unspecified: Secondary | ICD-10-CM

## 2014-08-18 DIAGNOSIS — N390 Urinary tract infection, site not specified: Secondary | ICD-10-CM

## 2014-08-18 DIAGNOSIS — K5900 Constipation, unspecified: Secondary | ICD-10-CM

## 2014-08-18 DIAGNOSIS — W19XXXA Unspecified fall, initial encounter: Secondary | ICD-10-CM | POA: Insufficient documentation

## 2014-08-18 DIAGNOSIS — Y92129 Unspecified place in nursing home as the place of occurrence of the external cause: Principal | ICD-10-CM

## 2014-08-18 DIAGNOSIS — I482 Chronic atrial fibrillation, unspecified: Secondary | ICD-10-CM

## 2014-08-18 NOTE — Assessment & Plan Note (Signed)
Stabilized. Takes lorazepam 0.5mg  qhs and prn also available to him.

## 2014-08-18 NOTE — Assessment & Plan Note (Signed)
Controlled, takes Metoprolol 25mg bid.   

## 2014-08-18 NOTE — Assessment & Plan Note (Signed)
08/15/14 found on floor in sitting position next to his bed.

## 2014-08-18 NOTE — Progress Notes (Signed)
Patient ID: Nicholas Hood, male   DOB: 1916-09-29, 80 y.o.   MRN: [id number]     Allergies  Allergen Reactions  . Accupril [Quinapril Hcl]     Per MAR  . Alacepril     Per MAR  . Altace [Ramipril]     Unknown reaction   . Hctz [Hydrochlorothiazide]     Unknown reaction  . Inderal [Propranolol]     Per MAR  . Mobic [Meloxicam]     Per MAR  . Monopril [Fosinopril]     Per MAR  . Quinapril Hcl     Unknown reaction  . Reserpine     Per [facility]    Chief Complaint  Patient presents with  . Medical Management of Chronic Issues  . Acute Visit    s/p fall    HPI: Patient is a 80 y.o. male seen in the SNF at [facility] today for fall 08/15/14 and chronic medical conditions.     Hospitalized 03/14/2014-03/20/2014 for Delirium-acute resolved, gram-negative rods sepsis UTI urinary tract infection-complete 10 day course of Levaquin SNF.   Problem List Items Addressed This Visit   UTI (urinary tract infection) - Primary     05/07/14 Urine culture E. Coli>100,000c/ml, Septra DS bid x 10days.  08/13/14 Urine culture P. stuartii>100,000c/ml. Septra DS bid x 10days.      Fall at nursing home     08/15/14 found on floor in sitting position next to his bed.     Essential hypertension, benign     Controlled, takes  Metoprolol 25mg  bid.       Constipation     Stable, takes Colace bid.       Atrial fibrillation     Rate controlled. Takes Xarelto for thromboembolic events risk reduction.    Anemia of chronic disease     08/12/14 Hgb 12.5     Adjustment disorder with mixed anxiety and depressed mood     Stabilized. Takes lorazepam 0.5mg  qhs and prn also available to him.           Review of Systems:  Review of Systems  Constitutional: Positive for malaise/fatigue. Negative for fever, chills, weight loss and diaphoresis.       Lethargy.  HENT: Positive for hearing loss. Negative for congestion, ear discharge, ear pain, nosebleeds and tinnitus.   Eyes: Negative for  blurred vision, double vision, photophobia, pain and discharge.  Respiratory: Positive for cough. Negative for hemoptysis, sputum production, shortness of breath, wheezing and stridor.   Cardiovascular: Negative for chest pain, palpitations, orthopnea, claudication, leg swelling and PND.  Gastrointestinal: Positive for constipation. Negative for heartburn, nausea, vomiting, abdominal pain, diarrhea and blood in stool.  Genitourinary: Positive for frequency and hematuria. Negative for dysuria, urgency and flank pain.       X1 08/12/14  Musculoskeletal: Positive for falls. Negative for back pain, joint pain, myalgias and neck pain.       W/c for mobility. Mechanical lift to get out/in bed 08/15/14 found on floor sitting by his bed-no apparent injury.   Skin: Positive for itching.       Hx of chronic itching. R heel pressure ulcer-open-a penny size-healed. Coccyx beefy red area-healed. New pressure areas on the top of the left toes-no open area, un blanchable redness.   Neurological: Positive for tremors. Negative for dizziness, tingling, sensory change, speech change, focal weakness, seizures, loss of consciousness, weakness and headaches.       Noted resting tremor in the right fingers  Endo/Heme/Allergies: Positive for environmental allergies. Negative for polydipsia. Bruises/bleeds easily.  Psychiatric/Behavioral: Positive for memory loss. Negative for depression, suicidal ideas, hallucinations and substance abuse. The patient is not nervous/anxious and does not have insomnia.      Past Medical History  Diagnosis Date  . Hypertension   . Prostate cancer   . Spinal stenosis   . Peripheral neuropathy   . Diverticulosis   . Complete heart block     s/p PPM  . Vitamin D deficiency   . Paroxysmal atrial fibrillation   . Congestive heart failure, unspecified 03/05/2012  . Unspecified hypertrophic and atrophic condition of skin 01/16/2012  . Sebaceous cyst 01/16/2012  . Unspecified  constipation 11/28/2011  . Other malaise and fatigue 01/28/2008  . Nonspecific abnormal electrocardiogram (ECG) (EKG) 01/28/2008  . Internal hemorrhoids without mention of complication 7/51/0258  . Unspecified hearing loss 02/19/2007  . Osteoarthrosis, unspecified whether generalized or localized, unspecified site 02/19/2007  . Abnormality of gait 06/04/1999  . Aortic valve disorders 06/04/1979  . Cerebellar atrophy 03/20/14  . Cerebrovascular disease 03/20/14    small vessell disease  . Urinary tract infection, site not specified 03/14/14  . Delirium 03/14/14  . Sepsis 03/14/14  . Anemia of chronic disease 2014  . Dysphagia 2015   Past Surgical History  Procedure Laterality Date  . Pacemaker insertion  01-04-2010    STJ 2210-RF dual chamber pacemaker implanted by Dr Rayann Heman for CHB  . Back surgery  2000    Spinal stenosis Dr. Achilles Dunk  . Nasal polyp excision    . Tonsillectomy    . Cataract extraction w/ intraocular lens  implant, bilateral  10/2009    Dr. Ellie Lunch  . Pacemaker placement  01/05/2010  . Colonoscopy  07/25/2007    int hem. diverticulosis  Dr. Lajoyce Corners   Social History:   reports that he quit smoking about 76 years ago. He has never used smokeless tobacco. He reports that he does not drink alcohol or use illicit drugs.  Family History  Problem Relation Age of Onset  . Coronary artery disease      family history  . Stroke Mother   . Heart disease Father   . Heart disease Brother   . Heart disease Brother   . Heart disease Brother   . Lung disease Brother     Medications: Patient's Medications  New Prescriptions   No medications on file  Previous Medications   ACETAMINOPHEN (TYLENOL) 325 MG TABLET    Take 650 mg by mouth every 4 (four) hours as needed for fever (temp over 100).   ALBUTEROL (PROVENTIL) (2.5 MG/3ML) 0.083% NEBULIZER SOLUTION    Take 3 mLs (2.5 mg total) by nebulization every 4 (four) hours as needed for wheezing or shortness of breath.   BENZONATATE (TESSALON) 200  MG CAPSULE    Take 1 capsule (200 mg total) by mouth 3 (three) times daily. For cough   CALCIUM CARB-CHOLECALCIFEROL (OYSTER SHELL CALCIUM + D) 500-400 MG-UNIT TABS    Take 1 tablet by mouth daily.   CETIRIZINE (ZYRTEC ALLERGY) 10 MG TABLET    Take 10 mg by mouth daily. To help with itching   CHOLECALCIFEROL (VITAMIN D) 1000 UNITS TABLET    Take 2,000 Units by mouth daily.   DOCUSATE SODIUM 100 MG CAPS    Take 100 mg by mouth 2 (two) times daily.   DOXAZOSIN (CARDURA) 2 MG TABLET    Take 2 mg by mouth daily.   GLUCOSAMINE-CHONDROITIN 500-400 MG CAPS  Take 3 capsules by mouth daily.   LOSARTAN (COZAAR) 50 MG TABLET    Take 50 mg by mouth daily. For Hypertention   OMEPRAZOLE (PRILOSEC) 20 MG CAPSULE    Take 20 mg by mouth daily.   RIVAROXABAN (XARELTO) 15 MG TABS TABLET    Take 15 mg by mouth daily after supper.  Modified Medications   No medications on file  Discontinued Medications   No medications on file     Physical Exam: Physical Exam  Constitutional: He is oriented to person, place, and time. He appears well-developed and well-nourished. No distress.  HENT:  Head: Normocephalic and atraumatic.  Right Ear: External ear normal.  Left Ear: External ear normal.  Nose: Nose normal.  Mouth/Throat: Oropharynx is clear and moist. No oropharyngeal exudate.  Eyes: Conjunctivae and EOM are normal. Pupils are equal, round, and reactive to light. Right eye exhibits no discharge. Left eye exhibits no discharge. No scleral icterus.  Neck: Normal range of motion. Neck supple. No JVD present. No tracheal deviation present. No thyromegaly present.  Cardiovascular: Normal rate and intact distal pulses.   Murmur heard. A-fib. EM 3/6  Pulmonary/Chest: Effort normal. No stridor. No respiratory distress. He has no wheezes. He has rales. He exhibits no tenderness.  Posterior mid to lower lungs dry rales.   Abdominal: Soft. Bowel sounds are normal. He exhibits no distension. There is no tenderness.  There is no rebound and no guarding.  Musculoskeletal: Normal range of motion. He exhibits no edema and no tenderness.  Motor scooter for mobility.   Lymphadenopathy:    He has no cervical adenopathy.  Neurological: He is alert and oriented to person, place, and time. He has normal reflexes. No cranial nerve deficit. He exhibits abnormal muscle tone. Coordination normal.  Reported more slurred speech in pm. No limb weakness or facial droop.   Skin: Skin is dry. No rash noted. He is not diaphoretic. No erythema. No pallor.  Scratchy marks left shin and right abd. New reddened areas on the top of the left toes.   Psychiatric: He has a normal mood and affect. His behavior is normal. Judgment and thought content normal.  Repetitive. Confusion. Anxious.     Filed Vitals:   08/18/14 1003  BP: 124/78  Pulse: 74  Temp: 97.8 F (36.6 C)  TempSrc: Tympanic  Resp: 16      Labs reviewed: Basic Metabolic Panel:  Recent Labs  03/13/14  03/16/14 0441 03/17/14 0627 03/19/14 0613  04/22/14 05/18/14 08/13/14  NA 138  < > 140 139 138  < > 142 135* 140  K 4.3  < > 4.2 4.2 4.3  < > 3.8 4.1 3.9  CL  --   < > 107 107 105  --   --   --   --   CO2  --   < > 22 23 24   --   --   --   --   GLUCOSE  --   < > 105* 127* 108*  --   --   --   --   BUN 29*  < > 22 26* 22  < > 19 19 22*  CREATININE 1.0  < > 0.98 0.97 0.94  < > 0.8 1.1 0.9  CALCIUM  --   < > 8.5 8.4 8.6  --   --   --   --   TSH 1.08  --   --   --   --   --   --   --   --   < > =  values in this interval not displayed. Liver Function Tests:  Recent Labs  03/14/14 1800 03/27/14 04/22/14 05/18/14  AST 21 14 14 15   ALT 17 11 9* 11  ALKPHOS 59 42 44 55  BILITOT 0.3  --   --   --   PROT 7.1  --   --   --   ALBUMIN 2.3*  --   --   --    CBC:  Recent Labs  03/14/14 1800  03/16/14 0441 03/17/14 0627 03/19/14 0613  04/22/14 05/18/14 08/13/14  WBC 13.3*  < > 11.1* 10.9* 11.6*  < > 5.7 4.0 7.8  NEUTROABS 11.2*  --   --   --   --   --    --   --   --   HGB 9.8*  < > 10.6* 11.0* 10.5*  < > 11.1* 11.9* 12.5*  HCT 30.5*  < > 31.7* 34.1* 31.6*  < > 32* 34* 38*  MCV 93.6  < > 93.5 94.2 93.8  --   --   --   --   PLT 207  < > 248 244 269  < > 192 158 245  < > = values in this interval not displayed.   Past Procedures:  05/18/14 CXR bibasilar atelectasis changes, negative for focal pneumonia, Cardiomegaly without evident heart failure findings.   08/12/14 US bladder: very large prostate, no massive distension of the urinary bladder is present, 6.4cmx6.4cmx7.1cm  Assessment/Plan UTI (urinary tract infection) 05/07/14 Urine culture E. Coli>100,000c/ml, Septra DS bid x 10days.  08/13/14 Urine culture P. stuartii>100,000c/ml. Septra DS bid x 10days.    Fall at nursing home 08/15/14 found on floor in sitting position next to his bed.   Adjustment disorder with mixed anxiety and depressed mood Stabilized. Takes lorazepam 0.5mg  qhs and prn also available to him.      Anemia of chronic disease 08/12/14 Hgb 12.5   Atrial fibrillation Rate controlled. Takes Xarelto for thromboembolic events risk reduction.  Constipation Stable, takes Colace bid.     Essential hypertension, benign Controlled, takes  Metoprolol 25mg  bid.       Family/ Staff Communication: observe the patient.   Goals of Care: SNF  Labs/tests ordered: none

## 2014-08-18 NOTE — Assessment & Plan Note (Signed)
08/12/14 Hgb 12.5   

## 2014-08-18 NOTE — Assessment & Plan Note (Signed)
Stable, takes Colace bid.   

## 2014-08-18 NOTE — Assessment & Plan Note (Signed)
05/07/14 Urine culture E. Coli>100,000c/ml, Septra DS bid x 10days.  08/13/14 Urine culture P. stuartii>100,000c/ml. Septra DS bid x 10days.

## 2014-08-18 NOTE — Assessment & Plan Note (Signed)
Rate controlled. Takes Xarelto for thromboembolic events risk reduction.  

## 2014-08-26 ENCOUNTER — Other Ambulatory Visit: Payer: Self-pay | Admitting: *Deleted

## 2014-08-26 MED ORDER — LORAZEPAM 1 MG PO TABS: ORAL_TABLET | ORAL | Status: DC

## 2014-08-26 NOTE — Telephone Encounter (Signed)
[facility] 

## 2014-09-04 ENCOUNTER — Non-Acute Institutional Stay (SKILLED_NURSING_FACILITY): Payer: Medicare Other | Admitting: Nurse Practitioner

## 2014-09-04 DIAGNOSIS — K5900 Constipation, unspecified: Secondary | ICD-10-CM

## 2014-09-04 DIAGNOSIS — I482 Chronic atrial fibrillation, unspecified: Secondary | ICD-10-CM

## 2014-09-04 DIAGNOSIS — I10 Essential (primary) hypertension: Secondary | ICD-10-CM

## 2014-09-04 DIAGNOSIS — D638 Anemia in other chronic diseases classified elsewhere: Secondary | ICD-10-CM

## 2014-09-04 DIAGNOSIS — F4323 Adjustment disorder with mixed anxiety and depressed mood: Secondary | ICD-10-CM

## 2014-09-04 DIAGNOSIS — N390 Urinary tract infection, site not specified: Secondary | ICD-10-CM

## 2014-09-04 NOTE — Assessment & Plan Note (Signed)
08/12/14 Hgb 12.5   

## 2014-09-04 NOTE — Progress Notes (Signed)
Patient ID: Nicholas Hood, male   DOB: 1915-12-29, 80 y.o.   MRN: [id number]     Allergies  Allergen Reactions  . Accupril [Quinapril Hcl]     Per MAR  . Alacepril     Per MAR  . Altace [Ramipril]     Unknown reaction   . Hctz [Hydrochlorothiazide]     Unknown reaction  . Inderal [Propranolol]     Per MAR  . Mobic [Meloxicam]     Per MAR  . Monopril [Fosinopril]     Per MAR  . Quinapril Hcl     Unknown reaction  . Reserpine     Per [facility]    Chief Complaint  Patient presents with  . Medical Management of Chronic Issues  . Acute Visit  . Anxiety    HPI: Patient is a 80 y.o. male seen in the SNF at [facility] today for anxiety/agitation/depression and chronic medical conditions.     Hospitalized 03/14/2014-03/20/2014 for Delirium-acute resolved, gram-negative rods sepsis UTI urinary tract infection-complete 10 day course of Levaquin SNF.   Problem List Items Addressed This Visit    UTI (urinary tract infection)    Fully treated and clinically resolved.     Essential hypertension, benign    Controlled, takes  Metoprolol 25mg  bid.        Constipation    Stable, takes Colace bid.       Atrial fibrillation    Rate controlled. Takes Xarelto for thromboembolic events risk reduction.     Anemia of chronic disease    08/12/14 Hgb 12.5     Adjustment disorder with mixed anxiety and depressed mood - Primary    09/04/14 Pharm decrease Lorazepam to 0.5mg  qhs and q6h prn-no prn doses are being needed-GDR.        Review of Systems:  Review of Systems  Constitutional: Negative for fever, chills, weight loss, malaise/fatigue and diaphoresis.  HENT: Positive for hearing loss. Negative for congestion, ear discharge, ear pain, nosebleeds and tinnitus.   Eyes: Negative for blurred vision, double vision, photophobia, pain and discharge.  Respiratory: Positive for cough. Negative for hemoptysis, sputum production, shortness of breath, wheezing and stridor.     Cardiovascular: Negative for chest pain, palpitations, orthopnea, claudication, leg swelling and PND.  Gastrointestinal: Positive for constipation. Negative for heartburn, nausea, vomiting, abdominal pain, diarrhea and blood in stool.  Genitourinary: Positive for frequency. Negative for dysuria, urgency, hematuria and flank pain.       Hematuria x1 08/12/14  Musculoskeletal: Positive for falls. Negative for myalgias, back pain, joint pain and neck pain.       W/c for mobility. Mechanical lift to get out/in bed 08/15/14 found on floor sitting by his bed-no apparent injury.   Skin: Positive for itching.       Hx of chronic itching. R heel pressure ulcer-open-a penny size-healed. Coccyx beefy red area-healed. New pressure areas on the top of the left toes-no open area, un blanchable redness.   Neurological: Positive for tremors. Negative for dizziness, tingling, sensory change, speech change, focal weakness, seizures, loss of consciousness, weakness and headaches.       Noted resting tremor in the right fingers  Endo/Heme/Allergies: Positive for environmental allergies. Negative for polydipsia. Bruises/bleeds easily.  Psychiatric/Behavioral: Positive for memory loss. Negative for depression, suicidal ideas, hallucinations and substance abuse. The patient is not nervous/anxious and does not have insomnia.      Past Medical History  Diagnosis Date  . Hypertension   . Prostate  cancer   . Spinal stenosis   . Peripheral neuropathy   . Diverticulosis   . Complete heart block     s/p PPM  . Vitamin D deficiency   . Paroxysmal atrial fibrillation   . Congestive heart failure, unspecified 03/05/2012  . Unspecified hypertrophic and atrophic condition of skin 01/16/2012  . Sebaceous cyst 01/16/2012  . Unspecified constipation 11/28/2011  . Other malaise and fatigue 01/28/2008  . Nonspecific abnormal electrocardiogram (ECG) (EKG) 01/28/2008  . Internal hemorrhoids without mention of complication 07/13/1940   . Unspecified hearing loss 02/19/2007  . Osteoarthrosis, unspecified whether generalized or localized, unspecified site 02/19/2007  . Abnormality of gait 06/04/1999  . Aortic valve disorders 06/04/1979  . Cerebellar atrophy 03/20/14  . Cerebrovascular disease 03/20/14    small vessell disease  . Urinary tract infection, site not specified 03/14/14  . Delirium 03/14/14  . Sepsis 03/14/14  . Anemia of chronic disease 2014  . Dysphagia 2015   Past Surgical History  Procedure Laterality Date  . Pacemaker insertion  01-04-2010    STJ 2210-RF dual chamber pacemaker implanted by Dr Rayann Heman for CHB  . Back surgery  2000    Spinal stenosis Dr. Achilles Dunk  . Nasal polyp excision    . Tonsillectomy    . Cataract extraction w/ intraocular lens  implant, bilateral  10/2009    Dr. Ellie Lunch  . Pacemaker placement  01/05/2010  . Colonoscopy  07/25/2007    int hem. diverticulosis  Dr. Lajoyce Corners   Social History:   reports that he quit smoking about 76 years ago. He has never used smokeless tobacco. He reports that he does not drink alcohol or use illicit drugs.  Family History  Problem Relation Age of Onset  . Coronary artery disease      family history  . Stroke Mother   . Heart disease Father   . Heart disease Brother   . Heart disease Brother   . Heart disease Brother   . Lung disease Brother     Medications: Patient's Medications  New Prescriptions   No medications on file  Previous Medications   ACETAMINOPHEN (TYLENOL) 325 MG TABLET    Take 650 mg by mouth every 4 (four) hours as needed for fever (temp over 100).   ALBUTEROL (PROVENTIL) (2.5 MG/3ML) 0.083% NEBULIZER SOLUTION    Take 3 mLs (2.5 mg total) by nebulization every 4 (four) hours as needed for wheezing or shortness of breath.   BENZONATATE (TESSALON) 200 MG CAPSULE    Take 1 capsule (200 mg total) by mouth 3 (three) times daily. For cough   CALCIUM CARB-CHOLECALCIFEROL (OYSTER SHELL CALCIUM + D) 500-400 MG-UNIT TABS    Take 1 tablet by mouth  daily.   CETIRIZINE (ZYRTEC ALLERGY) 10 MG TABLET    Take 10 mg by mouth daily. To help with itching   CHOLECALCIFEROL (VITAMIN D) 1000 UNITS TABLET    Take 2,000 Units by mouth daily.   DOCUSATE SODIUM 100 MG CAPS    Take 100 mg by mouth 2 (two) times daily.   DOXAZOSIN (CARDURA) 2 MG TABLET    Take 2 mg by mouth daily.   GLUCOSAMINE-CHONDROITIN 500-400 MG CAPS    Take 3 capsules by mouth daily.   LORAZEPAM (ATIVAN) 0.5 MG TABLET    Take 0.5 mg by mouth every 6 (six) hours as needed for anxiety. 0.5mg  po qhs.   LOSARTAN (COZAAR) 50 MG TABLET    Take 50 mg by mouth daily. For Hypertention  OMEPRAZOLE (PRILOSEC) 20 MG CAPSULE    Take 20 mg by mouth daily.   RIVAROXABAN (XARELTO) 15 MG TABS TABLET    Take 15 mg by mouth daily after supper.  Modified Medications   No medications on file  Discontinued Medications   LORAZEPAM (ATIVAN) 1 MG TABLET    Take one tablet by mouth every evening and take one tablet by mouth every 6 hours as needed for agitation     Physical Exam: Physical Exam  Constitutional: He is oriented to person, place, and time. He appears well-developed and well-nourished. No distress.  HENT:  Head: Normocephalic and atraumatic.  Right Ear: External ear normal.  Left Ear: External ear normal.  Nose: Nose normal.  Mouth/Throat: Oropharynx is clear and moist. No oropharyngeal exudate.  Eyes: Conjunctivae and EOM are normal. Pupils are equal, round, and reactive to light. Right eye exhibits no discharge. Left eye exhibits no discharge. No scleral icterus.  Neck: Normal range of motion. Neck supple. No JVD present. No tracheal deviation present. No thyromegaly present.  Cardiovascular: Normal rate and intact distal pulses.   Murmur heard. A-fib. EM 3/6  Pulmonary/Chest: Effort normal. No stridor. No respiratory distress. He has no wheezes. He has rales. He exhibits no tenderness.  Posterior mid to lower lungs dry rales.   Abdominal: Soft. Bowel sounds are normal. He exhibits  no distension. There is no tenderness. There is no rebound and no guarding.  Musculoskeletal: Normal range of motion. He exhibits no edema or tenderness.  Motor scooter for mobility.   Lymphadenopathy:    He has no cervical adenopathy.  Neurological: He is alert and oriented to person, place, and time. He has normal reflexes. No cranial nerve deficit. He exhibits abnormal muscle tone. Coordination normal.  Reported more slurred speech in pm. No limb weakness or facial droop.   Skin: Skin is dry. No rash noted. He is not diaphoretic. No erythema. No pallor.  Scratchy marks left shin and right abd  Psychiatric: He has a normal mood and affect. His behavior is normal. Judgment and thought content normal.  Repetitive. Confusion. Anxious. On and off    Filed Vitals:   09/04/14 1456  BP: 126/77  Pulse: 70  Temp: 97.6 F (36.4 C)  TempSrc: Tympanic  Resp: 20      Labs reviewed: Basic Metabolic Panel:  Recent Labs  03/13/14  03/16/14 0441 03/17/14 0627 03/19/14 0613  04/22/14 05/18/14 08/13/14  NA 138  < > 140 139 138  < > 142 135* 140  K 4.3  < > 4.2 4.2 4.3  < > 3.8 4.1 3.9  CL  --   < > 107 107 105  --   --   --   --   CO2  --   < > 22 23 24   --   --   --   --   GLUCOSE  --   < > 105* 127* 108*  --   --   --   --   BUN 29*  < > 22 26* 22  < > 19 19 22*  CREATININE 1.0  < > 0.98 0.97 0.94  < > 0.8 1.1 0.9  CALCIUM  --   < > 8.5 8.4 8.6  --   --   --   --   TSH 1.08  --   --   --   --   --   --   --   --   < > =  values in this interval not displayed. Liver Function Tests:  Recent Labs  03/14/14 1800 03/27/14 04/22/14 05/18/14  AST 21 14 14 15   ALT 17 11 9* 11  ALKPHOS 59 42 44 55  BILITOT 0.3  --   --   --   PROT 7.1  --   --   --   ALBUMIN 2.3*  --   --   --    CBC:  Recent Labs  03/14/14 1800  03/16/14 0441 03/17/14 0627 03/19/14 0613  04/22/14 05/18/14 08/13/14  WBC 13.3*  < > 11.1* 10.9* 11.6*  < > 5.7 4.0 7.8  NEUTROABS 11.2*  --   --   --   --   --   --    --   --   HGB 9.8*  < > 10.6* 11.0* 10.5*  < > 11.1* 11.9* 12.5*  HCT 30.5*  < > 31.7* 34.1* 31.6*  < > 32* 34* 38*  MCV 93.6  < > 93.5 94.2 93.8  --   --   --   --   PLT 207  < > 248 244 269  < > 192 158 245  < > = values in this interval not displayed.   Past Procedures:  05/18/14 CXR bibasilar atelectasis changes, negative for focal pneumonia, Cardiomegaly without evident heart failure findings.   08/12/14 US bladder: very large prostate, no massive distension of the urinary bladder is present, 6.4cmx6.4cmx7.1cm  Assessment/Plan Adjustment disorder with mixed anxiety and depressed mood 09/04/14 Pharm decrease Lorazepam to 0.5mg  qhs and q6h prn-no prn doses are being needed-GDR.   Anemia of chronic disease 08/12/14 Hgb 12.5   Atrial fibrillation Rate controlled. Takes Xarelto for thromboembolic events risk reduction.   Constipation Stable, takes Colace bid.     Essential hypertension, benign Controlled, takes  Metoprolol 25mg  bid.      UTI (urinary tract infection) Fully treated and clinically resolved.     Family/ Staff Communication: observe the patient.   Goals of Care: SNF  Labs/tests ordered: none

## 2014-09-04 NOTE — Assessment & Plan Note (Signed)
09/04/14 Pharm decrease Lorazepam to 0.5mg  qhs and q6h prn-no prn doses are being needed-GDR.

## 2014-09-04 NOTE — Assessment & Plan Note (Signed)
Rate controlled. Takes Xarelto for thromboembolic events risk reduction.  

## 2014-09-04 NOTE — Assessment & Plan Note (Signed)
Controlled, takes Metoprolol 25mg bid.   

## 2014-09-04 NOTE — Assessment & Plan Note (Signed)
Fully treated and clinically resolved.  

## 2014-09-04 NOTE — Assessment & Plan Note (Signed)
Stable, takes Colace bid.   

## 2014-10-02 ENCOUNTER — Encounter: Payer: Self-pay | Admitting: Nurse Practitioner

## 2014-10-02 ENCOUNTER — Non-Acute Institutional Stay (SKILLED_NURSING_FACILITY): Payer: Medicare Other | Admitting: Nurse Practitioner

## 2014-10-02 DIAGNOSIS — D638 Anemia in other chronic diseases classified elsewhere: Secondary | ICD-10-CM

## 2014-10-02 DIAGNOSIS — I10 Essential (primary) hypertension: Secondary | ICD-10-CM

## 2014-10-02 DIAGNOSIS — K5900 Constipation, unspecified: Secondary | ICD-10-CM

## 2014-10-02 DIAGNOSIS — I482 Chronic atrial fibrillation, unspecified: Secondary | ICD-10-CM

## 2014-10-02 DIAGNOSIS — F4323 Adjustment disorder with mixed anxiety and depressed mood: Secondary | ICD-10-CM

## 2014-10-02 DIAGNOSIS — R269 Unspecified abnormalities of gait and mobility: Secondary | ICD-10-CM

## 2014-10-02 NOTE — Assessment & Plan Note (Signed)
08/12/14 Hgb 12.5   

## 2014-10-02 NOTE — Assessment & Plan Note (Signed)
Rate controlled. Takes Xarelto for thromboembolic events risk reduction.  

## 2014-10-02 NOTE — Assessment & Plan Note (Signed)
09/04/14 Pharm decrease Lorazepam to 0.5mg  qhs and q6h prn-no prn doses are being needed-GDR. 10/02/14 stable.

## 2014-10-02 NOTE — Progress Notes (Signed)
Patient ID: Nicholas Hood, male   DOB: 02/05/1916, 80 y.o.   MRN: [id number]     Allergies  Allergen Reactions  . Accupril [Quinapril Hcl]     Per MAR  . Alacepril     Per MAR  . Altace [Ramipril]     Unknown reaction   . Hctz [Hydrochlorothiazide]     Unknown reaction  . Inderal [Propranolol]     Per MAR  . Mobic [Meloxicam]     Per MAR  . Monopril [Fosinopril]     Per MAR  . Quinapril Hcl     Unknown reaction  . Reserpine     Per [facility]    Chief Complaint  Patient presents with  . Medical Management of Chronic Issues    HPI: Patient is a 80 y.o. male seen in the SNF at [facility] today for chronic medical conditions.     Hospitalized 03/14/2014-03/20/2014 for Delirium-acute resolved, gram-negative rods sepsis UTI urinary tract infection-complete 10 day course of Levaquin SNF.   Problem List Items Addressed This Visit    Gait disorder    W/c for mobility.     Essential hypertension, benign    Controlled, takes  Metoprolol 25mg  bid.       Constipation    Stable, takes Colace bid.      Atrial fibrillation - Primary    Rate controlled. Takes Xarelto for thromboembolic events risk reduction.      Anemia of chronic disease    08/12/14 Hgb 12.5     Adjustment disorder with mixed anxiety and depressed mood    09/04/14 Pharm decrease Lorazepam to 0.5mg  qhs and q6h prn-no prn doses are being needed-GDR. 10/02/14 stable.          Review of Systems:  Review of Systems  Constitutional: Negative for fever, chills, weight loss, malaise/fatigue and diaphoresis.  HENT: Positive for hearing loss. Negative for congestion, ear discharge, ear pain, nosebleeds and tinnitus.   Eyes: Negative for blurred vision, double vision, photophobia, pain and discharge.  Respiratory: Positive for cough. Negative for hemoptysis, sputum production, shortness of breath, wheezing and stridor.   Cardiovascular: Positive for leg swelling. Negative for chest pain, palpitations,  orthopnea, claudication and PND.       BLE edema   Gastrointestinal: Positive for constipation. Negative for heartburn, nausea, vomiting, abdominal pain, diarrhea and blood in stool.  Genitourinary: Positive for frequency. Negative for dysuria, urgency, hematuria and flank pain.       Hematuria x1 08/12/14  Musculoskeletal: Positive for falls. Negative for myalgias, back pain, joint pain and neck pain.       W/c for mobility. Mechanical lift to get out/in bed 08/15/14 found on floor sitting by his bed-no apparent injury.   Skin: Positive for itching.       Hx of chronic itching. R heel pressure ulcer-open-a penny size-healed. Coccyx beefy red area-healed. New pressure areas on the top of the left toes-no open area, un blanchable redness.   Neurological: Positive for tremors. Negative for dizziness, tingling, sensory change, speech change, focal weakness, seizures, loss of consciousness, weakness and headaches.       Noted resting tremor in the right fingers  Endo/Heme/Allergies: Positive for environmental allergies. Negative for polydipsia. Bruises/bleeds easily.  Psychiatric/Behavioral: Positive for memory loss. Negative for depression, suicidal ideas, hallucinations and substance abuse. The patient is not nervous/anxious and does not have insomnia.      Past Medical History  Diagnosis Date  . Hypertension   . Prostate  cancer   . Spinal stenosis   . Peripheral neuropathy   . Diverticulosis   . Complete heart block     s/p PPM  . Vitamin D deficiency   . Paroxysmal atrial fibrillation   . Congestive heart failure, unspecified 03/05/2012  . Unspecified hypertrophic and atrophic condition of skin 01/16/2012  . Sebaceous cyst 01/16/2012  . Unspecified constipation 11/28/2011  . Other malaise and fatigue 01/28/2008  . Nonspecific abnormal electrocardiogram (ECG) (EKG) 01/28/2008  . Internal hemorrhoids without mention of complication 2/70/6237  . Unspecified hearing loss 02/19/2007  .  Osteoarthrosis, unspecified whether generalized or localized, unspecified site 02/19/2007  . Abnormality of gait 06/04/1999  . Aortic valve disorders 06/04/1979  . Cerebellar atrophy 03/20/14  . Cerebrovascular disease 03/20/14    small vessell disease  . Urinary tract infection, site not specified 03/14/14  . Delirium 03/14/14  . Sepsis 03/14/14  . Anemia of chronic disease 2014  . Dysphagia 2015   Past Surgical History  Procedure Laterality Date  . Pacemaker insertion  01-04-2010    STJ 2210-RF dual chamber pacemaker implanted by Dr Rayann Heman for CHB  . Back surgery  2000    Spinal stenosis Dr. Achilles Dunk  . Nasal polyp excision    . Tonsillectomy    . Cataract extraction w/ intraocular lens  implant, bilateral  10/2009    Dr. Ellie Lunch  . Pacemaker placement  01/05/2010  . Colonoscopy  07/25/2007    int hem. diverticulosis  Dr. Lajoyce Corners   Social History:   reports that he quit smoking about 76 years ago. He has never used smokeless tobacco. He reports that he does not drink alcohol or use illicit drugs.  Family History  Problem Relation Age of Onset  . Coronary artery disease      family history  . Stroke Mother   . Heart disease Father   . Heart disease Brother   . Heart disease Brother   . Heart disease Brother   . Lung disease Brother     Medications: Patient's Medications  New Prescriptions   No medications on file  Previous Medications   ACETAMINOPHEN (TYLENOL) 325 MG TABLET    Take 650 mg by mouth every 4 (four) hours as needed for fever (temp over 100).   ALBUTEROL (PROVENTIL) (2.5 MG/3ML) 0.083% NEBULIZER SOLUTION    Take 3 mLs (2.5 mg total) by nebulization every 4 (four) hours as needed for wheezing or shortness of breath.   BENZONATATE (TESSALON) 200 MG CAPSULE    Take 1 capsule (200 mg total) by mouth 3 (three) times daily. For cough   CALCIUM CARB-CHOLECALCIFEROL (OYSTER SHELL CALCIUM + D) 500-400 MG-UNIT TABS    Take 1 tablet by mouth daily.   CETIRIZINE (ZYRTEC ALLERGY) 10  MG TABLET    Take 10 mg by mouth daily. To help with itching   CHOLECALCIFEROL (VITAMIN D) 1000 UNITS TABLET    Take 2,000 Units by mouth daily.   DOCUSATE SODIUM 100 MG CAPS    Take 100 mg by mouth 2 (two) times daily.   DOXAZOSIN (CARDURA) 2 MG TABLET    Take 2 mg by mouth daily.   GLUCOSAMINE-CHONDROITIN 500-400 MG CAPS    Take 3 capsules by mouth daily.   LORAZEPAM (ATIVAN) 0.5 MG TABLET    Take 0.5 mg by mouth every 6 (six) hours as needed for anxiety. 0.5mg  po qhs.   LOSARTAN (COZAAR) 50 MG TABLET    Take 50 mg by mouth daily. For Hypertention  OMEPRAZOLE (PRILOSEC) 20 MG CAPSULE    Take 20 mg by mouth daily.   RIVAROXABAN (XARELTO) 15 MG TABS TABLET    Take 15 mg by mouth daily after supper.  Modified Medications   No medications on file  Discontinued Medications   No medications on file     Physical Exam: Physical Exam  Constitutional: He is oriented to person, place, and time. He appears well-developed and well-nourished. No distress.  HENT:  Head: Normocephalic and atraumatic.  Right Ear: External ear normal.  Left Ear: External ear normal.  Nose: Nose normal.  Mouth/Throat: Oropharynx is clear and moist. No oropharyngeal exudate.  Eyes: Conjunctivae and EOM are normal. Pupils are equal, round, and reactive to light. Right eye exhibits no discharge. Left eye exhibits no discharge. No scleral icterus.  Neck: Normal range of motion. Neck supple. No JVD present. No tracheal deviation present. No thyromegaly present.  Cardiovascular: Normal rate and intact distal pulses.   Murmur heard. A-fib. EM 3/6  Pulmonary/Chest: Effort normal. No stridor. No respiratory distress. He has no wheezes. He has rales. He exhibits no tenderness.  Posterior mid to lower lungs dry rales.   Abdominal: Soft. Bowel sounds are normal. He exhibits no distension. There is no tenderness. There is no rebound and no guarding.  Musculoskeletal: Normal range of motion. He exhibits edema. He exhibits no  tenderness.  Motor scooter for mobility.  BLE 1+ edema  Lymphadenopathy:    He has no cervical adenopathy.  Neurological: He is alert and oriented to person, place, and time. He has normal reflexes. No cranial nerve deficit. He exhibits abnormal muscle tone. Coordination normal.  Reported more slurred speech in pm. No limb weakness or facial droop.   Skin: Skin is dry. No rash noted. He is not diaphoretic. No erythema. No pallor.  Scratchy marks left shin and right abd  Psychiatric: He has a normal mood and affect. His behavior is normal. Judgment and thought content normal.  Repetitive. Confusion. Anxious. On and off    Filed Vitals:   10/02/14 1310  BP: 134/74  Pulse: 64  Temp: 97 F (36.1 C)  TempSrc: Tympanic  Resp: 20      Labs reviewed: Basic Metabolic Panel:  Recent Labs  03/13/14  03/16/14 0441 03/17/14 0627 03/19/14 0613  04/22/14 05/18/14 08/13/14  NA 138  < > 140 139 138  < > 142 135* 140  K 4.3  < > 4.2 4.2 4.3  < > 3.8 4.1 3.9  CL  --   < > 107 107 105  --   --   --   --   CO2  --   < > 22 23 24   --   --   --   --   GLUCOSE  --   < > 105* 127* 108*  --   --   --   --   BUN 29*  < > 22 26* 22  < > 19 19 22*  CREATININE 1.0  < > 0.98 0.97 0.94  < > 0.8 1.1 0.9  CALCIUM  --   < > 8.5 8.4 8.6  --   --   --   --   TSH 1.08  --   --   --   --   --   --   --   --   < > = values in this interval not displayed. Liver Function Tests:  Recent Labs  03/14/14 1800 03/27/14 04/22/14 05/18/14  AST  21 14 14 15   ALT 17 11 9* 11  ALKPHOS 59 42 44 55  BILITOT 0.3  --   --   --   PROT 7.1  --   --   --   ALBUMIN 2.3*  --   --   --    CBC:  Recent Labs  03/14/14 1800  03/16/14 0441 03/17/14 0627 03/19/14 0613  04/22/14 05/18/14 08/13/14  WBC 13.3*  < > 11.1* 10.9* 11.6*  < > 5.7 4.0 7.8  NEUTROABS 11.2*  --   --   --   --   --   --   --   --   HGB 9.8*  < > 10.6* 11.0* 10.5*  < > 11.1* 11.9* 12.5*  HCT 30.5*  < > 31.7* 34.1* 31.6*  < > 32* 34* 38*  MCV 93.6   < > 93.5 94.2 93.8  --   --   --   --   PLT 207  < > 248 244 269  < > 192 158 245  < > = values in this interval not displayed.   Past Procedures:  05/18/14 CXR bibasilar atelectasis changes, negative for focal pneumonia, Cardiomegaly without evident heart failure findings.   08/12/14 US bladder: very large prostate, no massive distension of the urinary bladder is present, 6.4cmx6.4cmx7.1cm  Assessment/Plan Gait disorder W/c for mobility.   Adjustment disorder with mixed anxiety and depressed mood 09/04/14 Pharm decrease Lorazepam to 0.5mg  qhs and q6h prn-no prn doses are being needed-GDR. 10/02/14 stable.     Anemia of chronic disease 08/12/14 Hgb 12.5   Atrial fibrillation Rate controlled. Takes Xarelto for thromboembolic events risk reduction.    Constipation Stable, takes Colace bid.    Essential hypertension, benign Controlled, takes  Metoprolol 25mg  bid.       Family/ Staff Communication: observe the patient.   Goals of Care: SNF  Labs/tests ordered: none

## 2014-10-02 NOTE — Assessment & Plan Note (Signed)
W/c for mobility °

## 2014-10-02 NOTE — Assessment & Plan Note (Signed)
Stable, takes Colace bid.   

## 2014-10-02 NOTE — Assessment & Plan Note (Signed)
Controlled, takes Metoprolol 25mg bid.   

## 2014-10-19 ENCOUNTER — Ambulatory Visit (INDEPENDENT_AMBULATORY_CARE_PROVIDER_SITE_OTHER): Payer: Medicare Other | Admitting: *Deleted

## 2014-10-19 ENCOUNTER — Encounter: Payer: Self-pay | Admitting: Internal Medicine

## 2014-10-19 DIAGNOSIS — I482 Chronic atrial fibrillation, unspecified: Secondary | ICD-10-CM

## 2014-10-19 DIAGNOSIS — I442 Atrioventricular block, complete: Secondary | ICD-10-CM

## 2014-10-19 NOTE — Progress Notes (Signed)
Remote pacemaker check. 

## 2014-10-20 LAB — MDC_IDC_ENUM_SESS_TYPE_REMOTE
Battery Remaining Longevity: 50 mo
Battery Remaining Percentage: 43 %
Battery Voltage: 2.87 V
Brady Statistic AP VP Percent: 34 %
Brady Statistic AS VP Percent: 65 %
Brady Statistic AS VS Percent: 1 %
Brady Statistic RV Percent Paced: 99 %
Date Time Interrogation Session: 20151228070010
Implantable Pulse Generator Model: 2210
Implantable Pulse Generator Serial Number: 7126761
Lead Channel Impedance Value: 360 Ohm
Lead Channel Pacing Threshold Amplitude: 0.625 V
Lead Channel Pacing Threshold Amplitude: 1 V
Lead Channel Pacing Threshold Pulse Width: 0.5 ms
Lead Channel Pacing Threshold Pulse Width: 0.5 ms
Lead Channel Sensing Intrinsic Amplitude: 12 mV
Lead Channel Sensing Intrinsic Amplitude: 3.8 mV
Lead Channel Setting Sensing Sensitivity: 2.5 mV
MDC IDC MSMT LEADCHNL RV IMPEDANCE VALUE: 630 Ohm
MDC IDC SET LEADCHNL RA PACING AMPLITUDE: 2 V
MDC IDC SET LEADCHNL RV PACING AMPLITUDE: 0.875
MDC IDC SET LEADCHNL RV PACING PULSEWIDTH: 0.5 ms
MDC IDC STAT BRADY AP VS PERCENT: 1 %
MDC IDC STAT BRADY RA PERCENT PACED: 9.6 %

## 2014-10-30 ENCOUNTER — Encounter: Payer: Self-pay | Admitting: Nurse Practitioner

## 2014-10-30 ENCOUNTER — Encounter: Payer: Self-pay | Admitting: *Deleted

## 2014-10-30 ENCOUNTER — Non-Acute Institutional Stay (SKILLED_NURSING_FACILITY): Payer: Medicare Other | Admitting: Nurse Practitioner

## 2014-10-30 DIAGNOSIS — I482 Chronic atrial fibrillation, unspecified: Secondary | ICD-10-CM

## 2014-10-30 DIAGNOSIS — F4323 Adjustment disorder with mixed anxiety and depressed mood: Secondary | ICD-10-CM

## 2014-10-30 DIAGNOSIS — I10 Essential (primary) hypertension: Secondary | ICD-10-CM

## 2014-10-30 DIAGNOSIS — D638 Anemia in other chronic diseases classified elsewhere: Secondary | ICD-10-CM

## 2014-10-30 DIAGNOSIS — K5900 Constipation, unspecified: Secondary | ICD-10-CM

## 2014-10-30 DIAGNOSIS — R609 Edema, unspecified: Secondary | ICD-10-CM

## 2014-10-30 DIAGNOSIS — R269 Unspecified abnormalities of gait and mobility: Secondary | ICD-10-CM

## 2014-10-30 NOTE — Assessment & Plan Note (Signed)
09/04/14 Pharm decrease Lorazepam to 0.5mg  qhs and q6h prn-no prn doses are being needed-GDR. 10/02/14 stable.  10/30/14 stable.

## 2014-10-30 NOTE — Assessment & Plan Note (Signed)
08/12/14 Hgb 12.5

## 2014-10-30 NOTE — Assessment & Plan Note (Signed)
Trace edema in ankles-compression hosiery daily. No noted cough or SOB

## 2014-10-30 NOTE — Assessment & Plan Note (Signed)
Stable, takes Colace bid.

## 2014-10-30 NOTE — Assessment & Plan Note (Signed)
Rate controlled. Takes Xarelto for thromboembolic events risk reduction.

## 2014-10-30 NOTE — Progress Notes (Signed)
Patient ID: Nicholas Hood, male   DOB: 1916-09-22, 81 y.o.   MRN: [id number]     Allergies  Allergen Reactions  . Accupril [Quinapril Hcl]     Per MAR  . Alacepril     Per MAR  . Altace [Ramipril]     Unknown reaction   . Hctz [Hydrochlorothiazide]     Unknown reaction  . Inderal [Propranolol]     Per MAR  . Mobic [Meloxicam]     Per MAR  . Monopril [Fosinopril]     Per MAR  . Quinapril Hcl     Unknown reaction  . Reserpine     Per [facility]    Chief Complaint  Patient presents with  . Medical Management of Chronic Issues  . Acute Visit    fall    HPI: Patient is a 81 y.o. male seen in the SNF at [facility] today for fall w/o injury and chronic medical conditions.     Hospitalized 03/14/2014-03/20/2014 for Delirium-acute resolved, gram-negative rods sepsis UTI urinary tract infection-complete 10 day course of Levaquin SNF.   Problem List Items Addressed This Visit    Gait disorder - Primary    W/c for mobility.     Essential hypertension, benign    Controlled, takes  Metoprolol 25mg  bid.      Edema    Trace edema in ankles-compression hosiery daily. No noted cough or SOB    Constipation    Stable, takes Colace bid.      Atrial fibrillation    Rate controlled. Takes Xarelto for thromboembolic events risk reduction.     Anemia of chronic disease    08/12/14 Hgb 12.5      Adjustment disorder with mixed anxiety and depressed mood    09/04/14 Pharm decrease Lorazepam to 0.5mg  qhs and q6h prn-no prn doses are being needed-GDR. 10/02/14 stable.  10/30/14 stable.          Review of Systems:  Review of Systems  Constitutional: Negative for fever, chills, weight loss, malaise/fatigue and diaphoresis.  HENT: Positive for hearing loss. Negative for congestion, ear discharge, ear pain, nosebleeds and tinnitus.   Eyes: Negative for blurred vision, double vision, photophobia, pain and discharge.  Respiratory: Positive for cough. Negative for hemoptysis,  sputum production, shortness of breath, wheezing and stridor.   Cardiovascular: Positive for leg swelling. Negative for chest pain, palpitations, orthopnea, claudication and PND.       BLE edema   Gastrointestinal: Positive for constipation. Negative for heartburn, nausea, vomiting, abdominal pain, diarrhea and blood in stool.  Genitourinary: Positive for frequency. Negative for dysuria, urgency, hematuria and flank pain.       Hematuria x1 08/12/14  Musculoskeletal: Positive for falls. Negative for myalgias, back pain, joint pain and neck pain.       W/c for mobility. Mechanical lift to get out/in bed 08/15/14 found on floor sitting by his bed-no apparent injury.  10/29/14 fall w/o injury.   Skin: Positive for itching.       Hx of chronic itching. R heel pressure ulcer-open-a penny size-healed. Coccyx beefy red area-healed. New pressure areas on the top of the left toes-no open area, un blanchable redness.   Neurological: Positive for tremors. Negative for dizziness, tingling, sensory change, speech change, focal weakness, seizures, loss of consciousness, weakness and headaches.       Noted resting tremor in the right fingers  Endo/Heme/Allergies: Positive for environmental allergies. Negative for polydipsia. Bruises/bleeds easily.  Psychiatric/Behavioral: Positive for memory loss.  Negative for depression, suicidal ideas, hallucinations and substance abuse. The patient is not nervous/anxious and does not have insomnia.      Past Medical History  Diagnosis Date  . Hypertension   . Prostate cancer   . Spinal stenosis   . Peripheral neuropathy   . Diverticulosis   . Complete heart block     s/p PPM  . Vitamin D deficiency   . Paroxysmal atrial fibrillation   . Congestive heart failure, unspecified 03/05/2012  . Unspecified hypertrophic and atrophic condition of skin 01/16/2012  . Sebaceous cyst 01/16/2012  . Unspecified constipation 11/28/2011  . Other malaise and fatigue 01/28/2008  .  Nonspecific abnormal electrocardiogram (ECG) (EKG) 01/28/2008  . Internal hemorrhoids without mention of complication 1/61/0960  . Unspecified hearing loss 02/19/2007  . Osteoarthrosis, unspecified whether generalized or localized, unspecified site 02/19/2007  . Abnormality of gait 06/04/1999  . Aortic valve disorders 06/04/1979  . Cerebellar atrophy 03/20/14  . Cerebrovascular disease 03/20/14    small vessell disease  . Urinary tract infection, site not specified 03/14/14  . Delirium 03/14/14  . Sepsis 03/14/14  . Anemia of chronic disease 2014  . Dysphagia 2015   Past Surgical History  Procedure Laterality Date  . Pacemaker insertion  01-04-2010    STJ 2210-RF dual chamber pacemaker implanted by Dr Rayann Heman for CHB  . Back surgery  2000    Spinal stenosis Dr. Achilles Dunk  . Nasal polyp excision    . Tonsillectomy    . Cataract extraction w/ intraocular lens  implant, bilateral  10/2009    Dr. Ellie Lunch  . Pacemaker placement  01/05/2010  . Colonoscopy  07/25/2007    int hem. diverticulosis  Dr. Lajoyce Corners   Social History:   reports that he quit smoking about 77 years ago. He has never used smokeless tobacco. He reports that he does not drink alcohol or use illicit drugs.  Family History  Problem Relation Age of Onset  . Coronary artery disease      family history  . Stroke Mother   . Heart disease Father   . Heart disease Brother   . Heart disease Brother   . Heart disease Brother   . Lung disease Brother     Medications: Patient's Medications  New Prescriptions   No medications on file  Previous Medications   ACETAMINOPHEN (TYLENOL) 325 MG TABLET    Take 650 mg by mouth every 4 (four) hours as needed for fever (temp over 100).   ALBUTEROL (PROVENTIL) (2.5 MG/3ML) 0.083% NEBULIZER SOLUTION    Take 3 mLs (2.5 mg total) by nebulization every 4 (four) hours as needed for wheezing or shortness of breath.   BENZONATATE (TESSALON) 200 MG CAPSULE    Take 1 capsule (200 mg total) by mouth 3 (three)  times daily. For cough   CALCIUM CARB-CHOLECALCIFEROL (OYSTER SHELL CALCIUM + D) 500-400 MG-UNIT TABS    Take 1 tablet by mouth daily.   CETIRIZINE (ZYRTEC ALLERGY) 10 MG TABLET    Take 10 mg by mouth daily. To help with itching   CHOLECALCIFEROL (VITAMIN D) 1000 UNITS TABLET    Take 2,000 Units by mouth daily.   DOCUSATE SODIUM 100 MG CAPS    Take 100 mg by mouth 2 (two) times daily.   DOXAZOSIN (CARDURA) 2 MG TABLET    Take 2 mg by mouth daily.   GLUCOSAMINE-CHONDROITIN 500-400 MG CAPS    Take 3 capsules by mouth daily.   LORAZEPAM (ATIVAN) 0.5 MG TABLET  Take 0.5 mg by mouth every 6 (six) hours as needed for anxiety. 0.5mg  po qhs.   LOSARTAN (COZAAR) 50 MG TABLET    Take 50 mg by mouth daily. For Hypertention   OMEPRAZOLE (PRILOSEC) 20 MG CAPSULE    Take 20 mg by mouth daily.   RIVAROXABAN (XARELTO) 15 MG TABS TABLET    Take 15 mg by mouth daily after supper.  Modified Medications   No medications on file  Discontinued Medications   No medications on file     Physical Exam: Physical Exam  Constitutional: He is oriented to person, place, and time. He appears well-developed and well-nourished. No distress.  HENT:  Head: Normocephalic and atraumatic.  Right Ear: External ear normal.  Left Ear: External ear normal.  Nose: Nose normal.  Mouth/Throat: Oropharynx is clear and moist. No oropharyngeal exudate.  Eyes: Conjunctivae and EOM are normal. Pupils are equal, round, and reactive to light. Right eye exhibits no discharge. Left eye exhibits no discharge. No scleral icterus.  Neck: Normal range of motion. Neck supple. No JVD present. No tracheal deviation present. No thyromegaly present.  Cardiovascular: Normal rate and intact distal pulses.   Murmur heard. A-fib. EM 3/6  Pulmonary/Chest: Effort normal. No stridor. No respiratory distress. He has no wheezes. He has rales. He exhibits no tenderness.  Posterior mid to lower lungs dry rales.   Abdominal: Soft. Bowel sounds are normal.  He exhibits no distension. There is no tenderness. There is no rebound and no guarding.  Musculoskeletal: Normal range of motion. He exhibits edema. He exhibits no tenderness.  Motor scooter for mobility.  BLE 1+ edema  Lymphadenopathy:    He has no cervical adenopathy.  Neurological: He is alert and oriented to person, place, and time. He has normal reflexes. No cranial nerve deficit. He exhibits abnormal muscle tone. Coordination normal.  Reported more slurred speech in pm. No limb weakness or facial droop.   Skin: Skin is dry. No rash noted. He is not diaphoretic. No erythema. No pallor.  Scratchy marks left shin and right abd  Psychiatric: He has a normal mood and affect. His behavior is normal. Judgment and thought content normal.  Repetitive. Confusion. Anxious. On and off    Filed Vitals:   10/30/14 1144  BP: 122/80  Pulse: 70  Temp: 97 F (36.1 C)  TempSrc: Tympanic  Resp: 20      Labs reviewed: Basic Metabolic Panel:  Recent Labs  03/13/14  03/16/14 0441 03/17/14 0627 03/19/14 0613  04/22/14 05/18/14 08/13/14  NA 138  < > 140 139 138  < > 142 135* 140  K 4.3  < > 4.2 4.2 4.3  < > 3.8 4.1 3.9  CL  --   < > 107 107 105  --   --   --   --   CO2  --   < > 22 23 24   --   --   --   --   GLUCOSE  --   < > 105* 127* 108*  --   --   --   --   BUN 29*  < > 22 26* 22  < > 19 19 22*  CREATININE 1.0  < > 0.98 0.97 0.94  < > 0.8 1.1 0.9  CALCIUM  --   < > 8.5 8.4 8.6  --   --   --   --   TSH 1.08  --   --   --   --   --   --   --   --   < > =  values in this interval not displayed. Liver Function Tests:  Recent Labs  03/14/14 1800 03/27/14 04/22/14 05/18/14  AST 21 14 14 15   ALT 17 11 9* 11  ALKPHOS 59 42 44 55  BILITOT 0.3  --   --   --   PROT 7.1  --   --   --   ALBUMIN 2.3*  --   --   --    CBC:  Recent Labs  03/14/14 1800  03/16/14 0441 03/17/14 0627 03/19/14 0613  04/22/14 05/18/14 08/13/14  WBC 13.3*  < > 11.1* 10.9* 11.6*  < > 5.7 4.0 7.8  NEUTROABS  11.2*  --   --   --   --   --   --   --   --   HGB 9.8*  < > 10.6* 11.0* 10.5*  < > 11.1* 11.9* 12.5*  HCT 30.5*  < > 31.7* 34.1* 31.6*  < > 32* 34* 38*  MCV 93.6  < > 93.5 94.2 93.8  --   --   --   --   PLT 207  < > 248 244 269  < > 192 158 245  < > = values in this interval not displayed.   Past Procedures:  05/18/14 CXR bibasilar atelectasis changes, negative for focal pneumonia, Cardiomegaly without evident heart failure findings.   08/12/14 US bladder: very large prostate, no massive distension of the urinary bladder is present, 6.4cmx6.4cmx7.1cm  Assessment/Plan Gait disorder W/c for mobility.   Essential hypertension, benign Controlled, takes  Metoprolol 25mg  bid.    Constipation Stable, takes Colace bid.    Atrial fibrillation Rate controlled. Takes Xarelto for thromboembolic events risk reduction.   Adjustment disorder with mixed anxiety and depressed mood 09/04/14 Pharm decrease Lorazepam to 0.5mg  qhs and q6h prn-no prn doses are being needed-GDR. 10/02/14 stable.  10/30/14 stable.     Anemia of chronic disease 08/12/14 Hgb 12.5    Edema Trace edema in ankles-compression hosiery daily. No noted cough or SOB    Family/ Staff Communication: observe the patient.   Goals of Care: SNF  Labs/tests ordered: none

## 2014-10-30 NOTE — Assessment & Plan Note (Signed)
W/c for mobility °

## 2014-10-30 NOTE — Assessment & Plan Note (Signed)
Controlled, takes  Metoprolol 25mg  bid.

## 2014-11-26 LAB — BASIC METABOLIC PANEL
BUN: 22 mg/dL — AB (ref 4–21)
Creatinine: 0.8 mg/dL (ref 0.6–1.3)
Glucose: 80 mg/dL
Potassium: 4 mmol/L (ref 3.4–5.3)
Sodium: 139 mmol/L (ref 137–147)

## 2014-11-26 LAB — HEPATIC FUNCTION PANEL
ALK PHOS: 54 U/L (ref 25–125)
ALT: 8 U/L — AB (ref 10–40)
AST: 9 U/L — AB (ref 14–40)
BILIRUBIN, TOTAL: 0.8 mg/dL

## 2014-11-26 LAB — CBC AND DIFFERENTIAL
HEMATOCRIT: 32 % — AB (ref 41–53)
Hemoglobin: 11.3 g/dL — AB (ref 13.5–17.5)
PLATELETS: 207 10*3/uL (ref 150–399)
WBC: 10.9 10^3/mL

## 2014-11-27 ENCOUNTER — Non-Acute Institutional Stay (SKILLED_NURSING_FACILITY): Payer: Medicare Other | Admitting: Nurse Practitioner

## 2014-11-27 DIAGNOSIS — R32 Unspecified urinary incontinence: Secondary | ICD-10-CM | POA: Insufficient documentation

## 2014-11-27 DIAGNOSIS — R609 Edema, unspecified: Secondary | ICD-10-CM

## 2014-11-27 DIAGNOSIS — K219 Gastro-esophageal reflux disease without esophagitis: Secondary | ICD-10-CM

## 2014-11-27 DIAGNOSIS — E46 Unspecified protein-calorie malnutrition: Secondary | ICD-10-CM

## 2014-11-27 DIAGNOSIS — J209 Acute bronchitis, unspecified: Secondary | ICD-10-CM | POA: Insufficient documentation

## 2014-11-27 DIAGNOSIS — F4323 Adjustment disorder with mixed anxiety and depressed mood: Secondary | ICD-10-CM

## 2014-11-27 DIAGNOSIS — D72829 Elevated white blood cell count, unspecified: Secondary | ICD-10-CM

## 2014-11-27 DIAGNOSIS — N3000 Acute cystitis without hematuria: Secondary | ICD-10-CM

## 2014-11-27 DIAGNOSIS — R0602 Shortness of breath: Secondary | ICD-10-CM | POA: Insufficient documentation

## 2014-11-27 DIAGNOSIS — I10 Essential (primary) hypertension: Secondary | ICD-10-CM

## 2014-11-27 DIAGNOSIS — D638 Anemia in other chronic diseases classified elsewhere: Secondary | ICD-10-CM

## 2014-11-27 DIAGNOSIS — I482 Chronic atrial fibrillation, unspecified: Secondary | ICD-10-CM

## 2014-11-27 DIAGNOSIS — N3942 Incontinence without sensory awareness: Secondary | ICD-10-CM

## 2014-11-27 NOTE — Assessment & Plan Note (Signed)
Trace edema in ankles-compression hosiery daily.

## 2014-11-27 NOTE — Assessment & Plan Note (Signed)
Stable, takes Omeprazole 20mg daily.  

## 2014-11-27 NOTE — Assessment & Plan Note (Addendum)
Rate controlled on Metoprolol 25mg  bid. Takes Xarelto for thromboembolic events risk reduction.

## 2014-11-27 NOTE — Assessment & Plan Note (Addendum)
05/07/14 Urine culture E. Coli>100,000c/ml, Septra DS bid x 10days.  08/13/14 Urine culture P stuartii >100,000c/ml. Septra DS bid x 10days.  11/26/14 UA pos nitrite, wbc 10.9-culture pending.  11/27/14 urine culture showed no growth.

## 2014-11-27 NOTE — Assessment & Plan Note (Signed)
09/04/14 Pharm decrease Lorazepam to 0.5mg  q4pm and q6h prn-no prn doses are being needed-GDR. --stable.

## 2014-11-27 NOTE — Progress Notes (Signed)
Patient ID: Nicholas Hood, male   DOB: June 26, 1916, 81 y.o.   MRN: [id number]     Allergies  Allergen Reactions  . Accupril [Quinapril Hcl]     Per MAR  . Alacepril     Per MAR  . Altace [Ramipril]     Unknown reaction   . Hctz [Hydrochlorothiazide]     Unknown reaction  . Inderal [Propranolol]     Per MAR  . Mobic [Meloxicam]     Per MAR  . Monopril [Fosinopril]     Per MAR  . Quinapril Hcl     Unknown reaction  . Reserpine     Per [facility]    Chief Complaint  Patient presents with  . Medical Management of Chronic Issues  . Acute Visit    SOB and cough.     HPI: Patient is a 81 y.o. male seen in the SNF at [facility] today for cough, SOB,  and chronic medical conditions.     Hospitalized 03/14/2014-03/20/2014 for Delirium-acute resolved, gram-negative rods sepsis UTI urinary tract infection-complete 10 day course of Levaquin SNF.   Problem List Items Addressed This Visit    UTI (urinary tract infection)    05/07/14 Urine culture E. Coli>100,000c/ml, Septra DS bid x 10days.  08/13/14 Urine culture P stuartii >100,000c/ml. Septra DS bid x 10days.  11/26/14 UA pos nitrite, wbc 10.9-culture pending.  11/27/14 urine culture showed no growth.        SOB (shortness of breath)    11/25/14 CXR mild cardiomegaly with borderline pulmonary vasculature, both lungs clear.  11/27/14 O2 desaturation, periodically SOB and cough, prn Neb is not adequate-tid x 3 days then prn, Levaquin for clinically presumed acute bronchitis       Protein-calorie malnutrition    11/26/14 albumin 2.5-dietary consult.        Leukocytosis    11/26/14 wbc 10.9      Incontinent of urine    Takes Doxazosin 2mg  daily.        GERD (gastroesophageal reflux disease)    Stable, takes Omeprazole 20mg  daily.       Essential hypertension, benign    Controlled, takes  Metoprolol 25mg  bid.        Edema    Trace edema in ankles-compression hosiery daily.        Atrial fibrillation    Rate  controlled on Metoprolol 25mg  bid. Takes Xarelto for thromboembolic events risk reduction.        Anemia of chronic disease    08/12/14 Hgb 12.5 11/26/14 Hgb 11.3        Adjustment disorder with mixed anxiety and depressed mood    09/04/14 Pharm decrease Lorazepam to 0.5mg  q4pm and q6h prn-no prn doses are being needed-GDR. --stable.          Acute bronchitis - Primary      Review of Systems:  Review of Systems  Constitutional: Positive for malaise/fatigue. Negative for fever, chills, weight loss and diaphoresis.  HENT: Positive for hearing loss. Negative for congestion, ear discharge, ear pain, nosebleeds, sore throat and tinnitus.   Eyes: Negative for blurred vision, double vision, photophobia, pain, discharge and redness.  Respiratory: Positive for cough, shortness of breath and wheezing. Negative for hemoptysis, sputum production and stridor.   Cardiovascular: Positive for leg swelling and PND. Negative for chest pain, palpitations, orthopnea and claudication.       Trace  Gastrointestinal: Negative for heartburn, nausea, vomiting, abdominal pain, diarrhea, constipation, blood in stool and melena.  Genitourinary: Positive for frequency. Negative for dysuria, urgency, hematuria and flank pain.  Musculoskeletal: Negative for myalgias, back pain, joint pain, falls and neck pain.  Skin: Negative for itching and rash.  Neurological: Positive for sensory change. Negative for dizziness, tingling, tremors, speech change, focal weakness, seizures, loss of consciousness, weakness and headaches.  Endo/Heme/Allergies: Negative for environmental allergies and polydipsia. Bruises/bleeds easily.  Psychiatric/Behavioral: Positive for memory loss. Negative for depression, suicidal ideas, hallucinations and substance abuse. The patient is not nervous/anxious and does not have insomnia.      Past Medical History  Diagnosis Date  . Hypertension   . Prostate cancer   . Spinal stenosis   .  Peripheral neuropathy   . Diverticulosis   . Complete heart block     s/p PPM  . Vitamin D deficiency   . Paroxysmal atrial fibrillation   . Congestive heart failure, unspecified 03/05/2012  . Unspecified hypertrophic and atrophic condition of skin 01/16/2012  . Sebaceous cyst 01/16/2012  . Unspecified constipation 11/28/2011  . Other malaise and fatigue 01/28/2008  . Nonspecific abnormal electrocardiogram (ECG) (EKG) 01/28/2008  . Internal hemorrhoids without mention of complication 5/36/6440  . Unspecified hearing loss 02/19/2007  . Osteoarthrosis, unspecified whether generalized or localized, unspecified site 02/19/2007  . Abnormality of gait 06/04/1999  . Aortic valve disorders 06/04/1979  . Cerebellar atrophy 03/20/14  . Cerebrovascular disease 03/20/14    small vessell disease  . Urinary tract infection, site not specified 03/14/14  . Delirium 03/14/14  . Sepsis 03/14/14  . Anemia of chronic disease 2014  . Dysphagia 2015  . Presence of permanent cardiac pacemaker    Past Surgical History  Procedure Laterality Date  . Pacemaker insertion  01-04-2010    STJ 2210-[id number] dual chamber pacemaker implanted by Dr Rayann Heman for CHB  . Back surgery  2000    Spinal stenosis Dr. Achilles Dunk  . Nasal polyp excision    . Tonsillectomy    . Cataract extraction w/ intraocular lens  implant, bilateral  10/2009    Dr. Ellie Lunch  . Pacemaker placement  01/05/2010  . Colonoscopy  07/25/2007    int hem. diverticulosis  Dr. Lajoyce Corners   Social History:   reports that he quit smoking about 77 years ago. He has never used smokeless tobacco. He reports that he does not drink alcohol or use illicit drugs.  Family History  Problem Relation Age of Onset  . Coronary artery disease      family history  . Stroke Mother   . Heart disease Father   . Heart disease Brother   . Heart disease Brother   . Heart disease Brother   . Lung disease Brother     Medications: Patient's Medications  New Prescriptions   No medications on  file  Previous Medications   ACETAMINOPHEN (TYLENOL) 325 MG TABLET    Take 650 mg by mouth every 4 (four) hours as needed for fever (temp over 100).   ALBUTEROL (PROVENTIL) (2.5 MG/3ML) 0.083% NEBULIZER SOLUTION    Take 3 mLs (2.5 mg total) by nebulization every 4 (four) hours as needed for wheezing or shortness of breath.   BENZONATATE (TESSALON) 200 MG CAPSULE    Take 1 capsule (200 mg total) by mouth 3 (three) times daily. For cough   CALCIUM CARB-CHOLECALCIFEROL (OYSTER SHELL CALCIUM + D) 500-400 MG-UNIT TABS    Take 1 tablet by mouth daily.   CETIRIZINE (ZYRTEC ALLERGY) 10 MG TABLET    Take 10 mg by mouth daily. To  help with itching   CHOLECALCIFEROL (VITAMIN D) 1000 UNITS TABLET    Take 2,000 Units by mouth daily.   DOCUSATE SODIUM 100 MG CAPS    Take 100 mg by mouth 2 (two) times daily.   DOXAZOSIN (CARDURA) 2 MG TABLET    Take 2 mg by mouth daily.   GLUCOSAMINE-CHONDROITIN 500-400 MG CAPS    Take 3 capsules by mouth daily.   LEVOFLOXACIN (LEVAQUIN) 750 MG TABLET    Take 750 mg by mouth daily. For 7 days   LORAZEPAM (ATIVAN) 0.5 MG TABLET    Take 0.5 mg by mouth at bedtime. 0.5mg  po qhs.   LORAZEPAM (ATIVAN) 0.5 MG TABLET    Take 0.5 mg by mouth every 6 (six) hours as needed for anxiety.   LOSARTAN (COZAAR) 50 MG TABLET    Take 50 mg by mouth daily. For Hypertention   METOPROLOL TARTRATE (LOPRESSOR) 25 MG TABLET    Take 25 mg by mouth 2 (two) times daily.   OMEPRAZOLE (PRILOSEC) 20 MG CAPSULE    Take 20 mg by mouth daily.   RIVAROXABAN (XARELTO) 15 MG TABS TABLET    Take 15 mg by mouth daily after supper.   SACCHAROMYCES BOULARDII (FLORASTOR) 250 MG CAPSULE    Take 250 mg by mouth 2 (two) times daily. For 7 days  Modified Medications   No medications on file  Discontinued Medications   No medications on file     Physical Exam: Physical Exam  Constitutional: He is oriented to person, place, and time. He appears well-developed and well-nourished. No distress.  HENT:  Head:  Normocephalic and atraumatic.  Right Ear: External ear normal.  Left Ear: External ear normal.  Nose: Nose normal.  Mouth/Throat: Oropharynx is clear and moist. No oropharyngeal exudate.  Eyes: Conjunctivae and EOM are normal. Pupils are equal, round, and reactive to light. Right eye exhibits no discharge. Left eye exhibits no discharge. No scleral icterus.  Neck: Normal range of motion. Neck supple. No JVD present. No tracheal deviation present. No thyromegaly present.  Cardiovascular: Normal rate and intact distal pulses.   Murmur heard. A-fib. EM 3/6  Pulmonary/Chest: Effort normal. No stridor. No respiratory distress. He has wheezes. He has rales. He exhibits no tenderness.  Posterior mid to lower lungs dry rales.   Abdominal: Soft. Bowel sounds are normal. He exhibits no distension. There is no tenderness. There is no rebound and no guarding.  Musculoskeletal: Normal range of motion. He exhibits edema. He exhibits no tenderness.  Motor scooter for mobility.  BLE trace edema  Lymphadenopathy:    He has no cervical adenopathy.  Neurological: He is alert and oriented to person, place, and time. He has normal reflexes. No cranial nerve deficit. He exhibits abnormal muscle tone. Coordination normal.  Reported more slurred speech in pm. No limb weakness or facial droop.   Skin: Skin is dry. No rash noted. He is not diaphoretic. No erythema. No pallor.  Scratchy marks left shin and right abd  Psychiatric: He has a normal mood and affect. His behavior is normal. Judgment and thought content normal.  Repetitive. Confusion. Anxious. On and off    Filed Vitals:   11/27/14 1641  BP: 138/85  Pulse: 70  Temp: 99.6 F (37.6 C)  TempSrc: Tympanic  Resp: 18      Labs reviewed: Basic Metabolic Panel:  Recent Labs  03/13/14  03/19/14 0613  11/26/14 11/28/14 0458 11/29/14 0435  NA 138  < > 138  < > 139  137 140  K 4.3  < > 4.3  < > 4.0 4.0 3.9  CL  --   < > 105  --   --  107 105  CO2   --   < > 24  --   --  24 28  GLUCOSE  --   < > 108*  --   --  145* 157*  BUN 29*  < > 22  < > 22* 20 28*  CREATININE 1.0  < > 0.94  < > 0.8 0.90 1.04  CALCIUM  --   < > 8.6  --   --  8.5 8.5  TSH 1.08  --   --   --   --   --   --   < > = values in this interval not displayed. Liver Function Tests:  Recent Labs  03/14/14 1800  05/18/14 11/26/14 11/28/14 0458  AST 21  < > 15 9* 16  ALT 17  < > 11 8* 8  ALKPHOS 59  < > 55 54 58  BILITOT 0.3  --   --   --  0.7  PROT 7.1  --   --   --  7.5  ALBUMIN 2.3*  --   --   --  2.6*  < > = values in this interval not displayed. CBC:  Recent Labs  03/14/14 1800  03/19/14 0613  11/26/14 11/28/14 0458 11/29/14 0435  WBC 13.3*  < > 11.6*  < > 10.9 13.1* 11.9*  NEUTROABS 11.2*  --   --   --   --  10.6*  --   HGB 9.8*  < > 10.5*  < > 11.3* 11.7* 11.1*  HCT 30.5*  < > 31.6*  < > 32* 36.2* 33.9*  MCV 93.6  < > 93.8  --   --  94.3 97.1  PLT 207  < > 269  < > 207 185 190  < > = values in this interval not displayed.   Past Procedures:  05/18/14 CXR bibasilar atelectasis changes, negative for focal pneumonia, Cardiomegaly without evident heart failure findings.   08/12/14 US bladder: very large prostate, no massive distension of the urinary bladder is present, 6.4cmx6.4cmx7.1cm  Assessment/Plan SOB (shortness of breath) 11/25/14 CXR mild cardiomegaly with borderline pulmonary vasculature, both lungs clear.  11/27/14 O2 desaturation, periodically SOB and cough, prn Neb is not adequate-tid x 3 days then prn, Levaquin for clinically presumed acute bronchitis    UTI (urinary tract infection) 05/07/14 Urine culture E. Coli>100,000c/ml, Septra DS bid x 10days.  08/13/14 Urine culture P stuartii >100,000c/ml. Septra DS bid x 10days.  11/26/14 UA pos nitrite, wbc 10.9-culture pending.  11/27/14 urine culture showed no growth.     Edema Trace edema in ankles-compression hosiery daily.     GERD (gastroesophageal reflux disease) Stable, takes  Omeprazole 20mg  daily.    Leukocytosis 11/26/14 wbc 10.9   Incontinent of urine Takes Doxazosin 2mg  daily.     Adjustment disorder with mixed anxiety and depressed mood 09/04/14 Pharm decrease Lorazepam to 0.5mg  q4pm and q6h prn-no prn doses are being needed-GDR. --stable.       Atrial fibrillation Rate controlled on Metoprolol 25mg  bid. Takes Xarelto for thromboembolic events risk reduction.     Essential hypertension, benign Controlled, takes  Metoprolol 25mg  bid.     Anemia of chronic disease 08/12/14 Hgb 12.5 11/26/14 Hgb 11.3     Protein-calorie malnutrition 11/26/14 albumin 2.5-dietary consult.       Family/  Staff Communication: observe the patient.   Goals of Care: SNF  Labs/tests ordered: CBC and CMP done 10/26/14

## 2014-11-27 NOTE — Assessment & Plan Note (Signed)
08/12/14 Hgb 12.5 11/26/14 Hgb 11.3

## 2014-11-27 NOTE — Assessment & Plan Note (Signed)
11/26/14 albumin 2.5-dietary consult.

## 2014-11-27 NOTE — Assessment & Plan Note (Signed)
Takes Doxazosin 2mg  daily.

## 2014-11-27 NOTE — Assessment & Plan Note (Signed)
Controlled, takes  Metoprolol 25mg  bid.

## 2014-11-27 NOTE — Assessment & Plan Note (Signed)
11/26/14 wbc 10.9

## 2014-11-27 NOTE — Assessment & Plan Note (Addendum)
11/25/14 CXR mild cardiomegaly with borderline pulmonary vasculature, both lungs clear.  11/27/14 O2 desaturation, periodically SOB and cough, prn Neb is not adequate-tid x 3 days then prn, Levaquin for clinically presumed acute bronchitis

## 2014-11-28 ENCOUNTER — Inpatient Hospital Stay (HOSPITAL_COMMUNITY)
Admission: EM | Admit: 2014-11-28 | Discharge: 2014-12-04 | DRG: 291 | Disposition: A | Discharge status: Skilled Nursing Facility | Payer: Medicare Other | Attending: Internal Medicine | Admitting: Internal Medicine

## 2014-11-28 ENCOUNTER — Emergency Department (HOSPITAL_COMMUNITY): Payer: Medicare Other

## 2014-11-28 ENCOUNTER — Encounter (HOSPITAL_COMMUNITY): Payer: Self-pay

## 2014-11-28 DIAGNOSIS — G92 Toxic encephalopathy: Secondary | ICD-10-CM | POA: Diagnosis present

## 2014-11-28 DIAGNOSIS — Z515 Encounter for palliative care: Secondary | ICD-10-CM | POA: Diagnosis not present

## 2014-11-28 DIAGNOSIS — J9601 Acute respiratory failure with hypoxia: Secondary | ICD-10-CM | POA: Diagnosis present

## 2014-11-28 DIAGNOSIS — Z950 Presence of cardiac pacemaker: Secondary | ICD-10-CM | POA: Diagnosis not present

## 2014-11-28 DIAGNOSIS — E870 Hyperosmolality and hypernatremia: Secondary | ICD-10-CM | POA: Diagnosis present

## 2014-11-28 DIAGNOSIS — J189 Pneumonia, unspecified organism: Secondary | ICD-10-CM

## 2014-11-28 DIAGNOSIS — R0600 Dyspnea, unspecified: Secondary | ICD-10-CM | POA: Insufficient documentation

## 2014-11-28 DIAGNOSIS — E860 Dehydration: Secondary | ICD-10-CM | POA: Diagnosis present

## 2014-11-28 DIAGNOSIS — I480 Paroxysmal atrial fibrillation: Secondary | ICD-10-CM | POA: Diagnosis present

## 2014-11-28 DIAGNOSIS — I639 Cerebral infarction, unspecified: Secondary | ICD-10-CM

## 2014-11-28 DIAGNOSIS — I4891 Unspecified atrial fibrillation: Secondary | ICD-10-CM | POA: Diagnosis present

## 2014-11-28 DIAGNOSIS — R1314 Dysphagia, pharyngoesophageal phase: Secondary | ICD-10-CM | POA: Diagnosis present

## 2014-11-28 DIAGNOSIS — M8940 Other hypertrophic osteoarthropathy, unspecified site: Secondary | ICD-10-CM | POA: Diagnosis present

## 2014-11-28 DIAGNOSIS — Z888 Allergy status to other drugs, medicaments and biological substances status: Secondary | ICD-10-CM

## 2014-11-28 DIAGNOSIS — R0602 Shortness of breath: Secondary | ICD-10-CM | POA: Diagnosis present

## 2014-11-28 DIAGNOSIS — E46 Unspecified protein-calorie malnutrition: Secondary | ICD-10-CM | POA: Diagnosis present

## 2014-11-28 DIAGNOSIS — J9801 Acute bronchospasm: Secondary | ICD-10-CM | POA: Diagnosis present

## 2014-11-28 DIAGNOSIS — Z8546 Personal history of malignant neoplasm of prostate: Secondary | ICD-10-CM

## 2014-11-28 DIAGNOSIS — I5022 Chronic systolic (congestive) heart failure: Secondary | ICD-10-CM

## 2014-11-28 DIAGNOSIS — Z961 Presence of intraocular lens: Secondary | ICD-10-CM | POA: Diagnosis present

## 2014-11-28 DIAGNOSIS — Z9841 Cataract extraction status, right eye: Secondary | ICD-10-CM

## 2014-11-28 DIAGNOSIS — Z66 Do not resuscitate: Secondary | ICD-10-CM | POA: Insufficient documentation

## 2014-11-28 DIAGNOSIS — I442 Atrioventricular block, complete: Secondary | ICD-10-CM | POA: Diagnosis present

## 2014-11-28 DIAGNOSIS — T50995A Adverse effect of other drugs, medicaments and biological substances, initial encounter: Secondary | ICD-10-CM | POA: Diagnosis present

## 2014-11-28 DIAGNOSIS — Z87440 Personal history of urinary (tract) infections: Secondary | ICD-10-CM | POA: Diagnosis not present

## 2014-11-28 DIAGNOSIS — I248 Other forms of acute ischemic heart disease: Secondary | ICD-10-CM | POA: Diagnosis present

## 2014-11-28 DIAGNOSIS — Z8673 Personal history of transient ischemic attack (TIA), and cerebral infarction without residual deficits: Secondary | ICD-10-CM | POA: Diagnosis not present

## 2014-11-28 DIAGNOSIS — R609 Edema, unspecified: Secondary | ICD-10-CM | POA: Diagnosis present

## 2014-11-28 DIAGNOSIS — Z9842 Cataract extraction status, left eye: Secondary | ICD-10-CM

## 2014-11-28 DIAGNOSIS — I5043 Acute on chronic combined systolic (congestive) and diastolic (congestive) heart failure: Principal | ICD-10-CM | POA: Diagnosis present

## 2014-11-28 DIAGNOSIS — D638 Anemia in other chronic diseases classified elsewhere: Secondary | ICD-10-CM | POA: Diagnosis present

## 2014-11-28 DIAGNOSIS — Z87891 Personal history of nicotine dependence: Secondary | ICD-10-CM | POA: Diagnosis not present

## 2014-11-28 DIAGNOSIS — F0390 Unspecified dementia without behavioral disturbance: Secondary | ICD-10-CM | POA: Diagnosis present

## 2014-11-28 DIAGNOSIS — N179 Acute kidney failure, unspecified: Secondary | ICD-10-CM | POA: Diagnosis present

## 2014-11-28 DIAGNOSIS — F419 Anxiety disorder, unspecified: Secondary | ICD-10-CM | POA: Diagnosis present

## 2014-11-28 DIAGNOSIS — I482 Chronic atrial fibrillation: Secondary | ICD-10-CM

## 2014-11-28 DIAGNOSIS — H9193 Unspecified hearing loss, bilateral: Secondary | ICD-10-CM | POA: Diagnosis present

## 2014-11-28 DIAGNOSIS — G629 Polyneuropathy, unspecified: Secondary | ICD-10-CM | POA: Diagnosis present

## 2014-11-28 DIAGNOSIS — J9600 Acute respiratory failure, unspecified whether with hypoxia or hypercapnia: Secondary | ICD-10-CM | POA: Diagnosis present

## 2014-11-28 DIAGNOSIS — I10 Essential (primary) hypertension: Secondary | ICD-10-CM | POA: Diagnosis present

## 2014-11-28 DIAGNOSIS — C61 Malignant neoplasm of prostate: Secondary | ICD-10-CM | POA: Diagnosis present

## 2014-11-28 HISTORY — DX: Presence of cardiac pacemaker: Z95.0

## 2014-11-28 LAB — COMPREHENSIVE METABOLIC PANEL
ALBUMIN: 2.6 g/dL — AB (ref 3.5–5.2)
ALK PHOS: 58 U/L (ref 39–117)
ALT: 8 U/L (ref 0–53)
AST: 16 U/L (ref 0–37)
Anion gap: 6 (ref 5–15)
BUN: 20 mg/dL (ref 6–23)
CO2: 24 mmol/L (ref 19–32)
CREATININE: 0.9 mg/dL (ref 0.50–1.35)
Calcium: 8.5 mg/dL (ref 8.4–10.5)
Chloride: 107 mmol/L (ref 96–112)
GFR calc non Af Amer: 70 mL/min — ABNORMAL LOW (ref >90)
GFR, EST AFRICAN AMERICAN: 81 mL/min — AB (ref >90)
GLUCOSE: 145 mg/dL — AB (ref 70–99)
Potassium: 4 mmol/L (ref 3.5–5.1)
SODIUM: 137 mmol/L (ref 135–145)
Total Bilirubin: 0.7 mg/dL (ref 0.3–1.2)
Total Protein: 7.5 g/dL (ref 6.0–8.3)

## 2014-11-28 LAB — PROTIME-INR
INR: 1.98 — ABNORMAL HIGH (ref 0.00–1.49)
PROTHROMBIN TIME: 22.6 s — AB (ref 11.6–15.2)

## 2014-11-28 LAB — BRAIN NATRIURETIC PEPTIDE: B Natriuretic Peptide: 362.6 pg/mL — ABNORMAL HIGH (ref 0.0–100.0)

## 2014-11-28 LAB — CBC WITH DIFFERENTIAL/PLATELET
Basophils Absolute: 0 10*3/uL (ref 0.0–0.1)
Basophils Relative: 0 % (ref 0–1)
Eosinophils Absolute: 0.1 10*3/uL (ref 0.0–0.7)
Eosinophils Relative: 1 % (ref 0–5)
HEMATOCRIT: 36.2 % — AB (ref 39.0–52.0)
Hemoglobin: 11.7 g/dL — ABNORMAL LOW (ref 13.0–17.0)
LYMPHS PCT: 10 % — AB (ref 12–46)
Lymphs Abs: 1.2 10*3/uL (ref 0.7–4.0)
MCH: 30.5 pg (ref 26.0–34.0)
MCHC: 32.3 g/dL (ref 30.0–36.0)
MCV: 94.3 fL (ref 78.0–100.0)
Monocytes Absolute: 1.2 10*3/uL — ABNORMAL HIGH (ref 0.1–1.0)
Monocytes Relative: 9 % (ref 3–12)
Neutro Abs: 10.6 10*3/uL — ABNORMAL HIGH (ref 1.7–7.7)
Neutrophils Relative %: 80 % — ABNORMAL HIGH (ref 43–77)
Platelets: 185 10*3/uL (ref 150–400)
RBC: 3.84 MIL/uL — AB (ref 4.22–5.81)
RDW: 14.1 % (ref 11.5–15.5)
WBC: 13.1 10*3/uL — ABNORMAL HIGH (ref 4.0–10.5)

## 2014-11-28 LAB — TROPONIN I
TROPONIN I: 0.13 ng/mL — AB (ref <0.031)
Troponin I: 0.14 ng/mL — ABNORMAL HIGH (ref <0.031)
Troponin I: 0.12 ng/mL — ABNORMAL HIGH (ref <0.031)
Troponin I: 0.12 ng/mL — ABNORMAL HIGH (ref <0.031)

## 2014-11-28 LAB — I-STAT CG4 LACTIC ACID, ED: LACTIC ACID, VENOUS: 1.48 mmol/L (ref 0.5–2.0)

## 2014-11-28 LAB — MRSA PCR SCREENING: MRSA by PCR: NEGATIVE

## 2014-11-28 LAB — INFLUENZA PANEL BY PCR (TYPE A & B)
H1N1 flu by pcr: NOT DETECTED
INFLAPCR: NEGATIVE
Influenza B By PCR: NEGATIVE

## 2014-11-28 LAB — STREP PNEUMONIAE URINARY ANTIGEN: Strep Pneumo Urinary Antigen: NEGATIVE

## 2014-11-28 MED ORDER — PANTOPRAZOLE SODIUM 40 MG PO TBEC
40.0000 mg | DELAYED_RELEASE_TABLET | Freq: Every day | ORAL | Status: DC
  Administered 2014-11-28: 40 mg via ORAL
  Filled 2014-11-28: qty 1

## 2014-11-28 MED ORDER — DOXAZOSIN MESYLATE 2 MG PO TABS
2.0000 mg | ORAL_TABLET | Freq: Every day | ORAL | Status: DC
  Administered 2014-11-28: 2 mg via ORAL
  Filled 2014-11-28 (×5): qty 1

## 2014-11-28 MED ORDER — METOPROLOL TARTRATE 25 MG PO TABS
25.0000 mg | ORAL_TABLET | Freq: Two times a day (BID) | ORAL | Status: DC
  Administered 2014-11-28 (×2): 25 mg via ORAL
  Filled 2014-11-28 (×4): qty 1

## 2014-11-28 MED ORDER — VANCOMYCIN HCL 10 G IV SOLR
1500.0000 mg | Freq: Once | INTRAVENOUS | Status: AC
  Administered 2014-11-28: 1500 mg via INTRAVENOUS
  Filled 2014-11-28: qty 1500

## 2014-11-28 MED ORDER — DOCUSATE SODIUM 100 MG PO CAPS
100.0000 mg | ORAL_CAPSULE | Freq: Two times a day (BID) | ORAL | Status: DC
  Administered 2014-11-28 – 2014-11-29 (×2): 100 mg via ORAL
  Filled 2014-11-28 (×2): qty 1

## 2014-11-28 MED ORDER — SACCHAROMYCES BOULARDII 250 MG PO CAPS
250.0000 mg | ORAL_CAPSULE | Freq: Two times a day (BID) | ORAL | Status: DC
  Administered 2014-11-28 (×2): 250 mg via ORAL
  Filled 2014-11-28 (×11): qty 1

## 2014-11-28 MED ORDER — HYDROCODONE-ACETAMINOPHEN 5-325 MG PO TABS: 1.0000 | ORAL_TABLET | Freq: Four times a day (QID) | ORAL | Status: DC | PRN

## 2014-11-28 MED ORDER — FUROSEMIDE 10 MG/ML IJ SOLN
60.0000 mg | Freq: Once | INTRAMUSCULAR | Status: AC
  Administered 2014-11-28: 60 mg via INTRAVENOUS
  Filled 2014-11-28: qty 6

## 2014-11-28 MED ORDER — NITROGLYCERIN 2 % TD OINT
0.5000 [in_us] | TOPICAL_OINTMENT | Freq: Four times a day (QID) | TRANSDERMAL | Status: DC
  Administered 2014-11-28 – 2014-11-29 (×5): 0.5 [in_us] via TOPICAL
  Filled 2014-11-28: qty 30
  Filled 2014-11-28: qty 1

## 2014-11-28 MED ORDER — FUROSEMIDE 10 MG/ML IJ SOLN
40.0000 mg | Freq: Every day | INTRAMUSCULAR | Status: DC
  Administered 2014-11-28 – 2014-11-29 (×2): 40 mg via INTRAVENOUS
  Filled 2014-11-28 (×3): qty 4

## 2014-11-28 MED ORDER — METHYLPREDNISOLONE SODIUM SUCC 125 MG IJ SOLR
125.0000 mg | Freq: Once | INTRAMUSCULAR | Status: AC
  Administered 2014-11-28: 125 mg via INTRAVENOUS
  Filled 2014-11-28: qty 2

## 2014-11-28 MED ORDER — POLYETHYLENE GLYCOL 3350 17 G PO PACK
17.0000 g | PACK | Freq: Every day | ORAL | Status: DC | PRN
  Filled 2014-11-28: qty 1

## 2014-11-28 MED ORDER — GUAIFENESIN-DM 100-10 MG/5ML PO SYRP
5.0000 mL | ORAL_SOLUTION | ORAL | Status: DC | PRN
  Administered 2014-11-29: 5 mL via ORAL
  Filled 2014-11-28 (×2): qty 5

## 2014-11-28 MED ORDER — DEXTROSE 5 % IV SOLN
1.0000 g | Freq: Once | INTRAVENOUS | Status: AC
  Administered 2014-11-28: 1 g via INTRAVENOUS
  Filled 2014-11-28: qty 1

## 2014-11-28 MED ORDER — LORAZEPAM 0.5 MG PO TABS
0.5000 mg | ORAL_TABLET | Freq: Four times a day (QID) | ORAL | Status: DC | PRN
  Administered 2014-11-29: 0.5 mg via ORAL
  Filled 2014-11-28: qty 1

## 2014-11-28 MED ORDER — PIPERACILLIN-TAZOBACTAM 3.375 G IVPB
3.3750 g | Freq: Three times a day (TID) | INTRAVENOUS | Status: DC
  Administered 2014-11-28 – 2014-12-04 (×17): 3.375 g via INTRAVENOUS
  Filled 2014-11-28 (×24): qty 50

## 2014-11-28 MED ORDER — IPRATROPIUM-ALBUTEROL 0.5-2.5 (3) MG/3ML IN SOLN
3.0000 mL | Freq: Once | RESPIRATORY_TRACT | Status: AC
  Administered 2014-11-28: 3 mL via RESPIRATORY_TRACT
  Filled 2014-11-28: qty 3

## 2014-11-28 MED ORDER — RIVAROXABAN 15 MG PO TABS
15.0000 mg | ORAL_TABLET | Freq: Every day | ORAL | Status: DC
  Administered 2014-11-28: 15 mg via ORAL
  Filled 2014-11-28 (×2): qty 1

## 2014-11-28 MED ORDER — VANCOMYCIN HCL IN DEXTROSE 1-5 GM/200ML-% IV SOLN
1000.0000 mg | INTRAVENOUS | Status: DC
  Administered 2014-11-29 – 2014-12-03 (×5): 1000 mg via INTRAVENOUS
  Filled 2014-11-28 (×7): qty 200

## 2014-11-28 MED ORDER — ONDANSETRON HCL 4 MG/2ML IJ SOLN: 4.0000 mg | Freq: Four times a day (QID) | INTRAMUSCULAR | Status: DC | PRN

## 2014-11-28 MED ORDER — ONDANSETRON HCL 4 MG PO TABS: 4.0000 mg | ORAL_TABLET | Freq: Four times a day (QID) | ORAL | Status: DC | PRN

## 2014-11-28 NOTE — Progress Notes (Addendum)
ANTIBIOTIC CONSULT NOTE - INITIAL  Pharmacy Consult for Vancomycin  Indication: rule out pneumonia  Allergies  Allergen Reactions  . Accupril [Quinapril Hcl]     Per MAR  . Alacepril     Per MAR  . Altace [Ramipril]     Unknown reaction   . Hctz [Hydrochlorothiazide]     Unknown reaction  . Inderal [Propranolol]     Per MAR  . Mobic [Meloxicam]     Per MAR  . Monopril [Fosinopril]     Per MAR  . Quinapril Hcl     Unknown reaction  . Reserpine     Per MAR    Patient Measurements:   Adjusted Body Weight: 90 kg  Vital Signs: BP: 122/69 mmHg (02/06 0700) Pulse Rate: 75 (02/06 0700) Intake/Output from previous day:   Intake/Output from this shift: Total I/O In: 50 [I.V.:50] Out: -   Labs:  Recent Labs  11/26/14 11/28/14 0458  WBC 10.9 13.1*  HGB 11.3* 11.7*  PLT 207 185  CREATININE 0.8 0.90   CrCl cannot be calculated (Unknown ideal weight.). No results for input(s): VANCOTROUGH, VANCOPEAK, VANCORANDOM, GENTTROUGH, GENTPEAK, GENTRANDOM, TOBRATROUGH, TOBRAPEAK, TOBRARND, AMIKACINPEAK, AMIKACINTROU, AMIKACIN in the last 72 hours.   Microbiology: No results found for this or any previous visit (from the past 720 hour(s)).  Medical History: Past Medical History  Diagnosis Date  . Hypertension   . Prostate cancer   . Spinal stenosis   . Peripheral neuropathy   . Diverticulosis   . Complete heart block     s/p PPM  . Vitamin D deficiency   . Paroxysmal atrial fibrillation   . Congestive heart failure, unspecified 03/05/2012  . Unspecified hypertrophic and atrophic condition of skin 01/16/2012  . Sebaceous cyst 01/16/2012  . Unspecified constipation 11/28/2011  . Other malaise and fatigue 01/28/2008  . Nonspecific abnormal electrocardiogram (ECG) (EKG) 01/28/2008  . Internal hemorrhoids without mention of complication 6/43/3295  . Unspecified hearing loss 02/19/2007  . Osteoarthrosis, unspecified whether generalized or localized, unspecified site 02/19/2007  .  Abnormality of gait 06/04/1999  . Aortic valve disorders 06/04/1979  . Cerebellar atrophy 03/20/14  . Cerebrovascular disease 03/20/14    small vessell disease  . Urinary tract infection, site not specified 03/14/14  . Delirium 03/14/14  . Sepsis 03/14/14  . Anemia of chronic disease 2014  . Dysphagia 2015    Medications:  Albuterol  Tessalon  Oscal  Vit D  Cardura  Levaquin  Ativan  Lopressor  Prilosec  Xarelto  Florastor  Assessment: 81 yo male with SOB, possible PNA, for empiric antibiotics  Vancomycin 1500 mg IV given in ED at 0715  Goal of Therapy:  Vancomycin trough level 15-20 mcg/ml  Plan:  Vancomycin 1 g IV q24h  Abbott, Bronson Curb 11/28/2014,7:36 AM  ADDENDUM Patient also on Zosyn per pharmacy. Awaiting updated height and weight. Scr 0.9.  Plan: Zosyn 3.375g IV q8h EI as ordered  Tayana Shankle D. Sherene Plancarte, PharmD, BCPS Clinical Pharmacist Pager: [phone] 11/28/2014 1:34 PM

## 2014-11-28 NOTE — ED Notes (Signed)
Pt transitioned to a NRB by RT with admitting at the bedside. Pt tolerating at this time.

## 2014-11-28 NOTE — Progress Notes (Signed)
RT Note:  Pt had Bipap in ED.  Order is old, but Rt will continue to monitor.

## 2014-11-28 NOTE — Evaluation (Signed)
  SLP Cancellation Note  Patient Details Name: Nicholas Hood MRN: [id number] DOB: 19-Aug-1916   Cancelled treatment:       Reason Eval/Treat Not Completed: Medical issues which prohibited therapy (Respiratory rate is 61 and pt in on high flow oxygen, did not proceed with swallow eval due to concern for aspiration, Son Nicholas Hood) present and was educated, reports pt on nectar at [facility],   Request RN to page if pt becomes appropriate for po/eval.   [place], [place] [facility] SLP [phone]

## 2014-11-28 NOTE — H&P (Signed)
Patient Demographics  Nicholas Hood, is a 81 y.o. male  MRN: [id number]   DOB - 1915/12/25  Admit Date - 11/28/2014  Outpatient Primary MD for the patient is GREEN, [name], MD   With History of -  Past Medical History  Diagnosis Date  . Hypertension   . Prostate cancer   . Spinal stenosis   . Peripheral neuropathy   . Diverticulosis   . Complete heart block     s/p PPM  . Vitamin D deficiency   . Paroxysmal atrial fibrillation   . Congestive heart failure, unspecified 03/05/2012  . Unspecified hypertrophic and atrophic condition of skin 01/16/2012  . Sebaceous cyst 01/16/2012  . Unspecified constipation 11/28/2011  . Other malaise and fatigue 01/28/2008  . Nonspecific abnormal electrocardiogram (ECG) (EKG) 01/28/2008  . Internal hemorrhoids without mention of complication 2/77/8242  . Unspecified hearing loss 02/19/2007  . Osteoarthrosis, unspecified whether generalized or localized, unspecified site 02/19/2007  . Abnormality of gait 06/04/1999  . Aortic valve disorders 06/04/1979  . Cerebellar atrophy 03/20/14  . Cerebrovascular disease 03/20/14    small vessell disease  . Urinary tract infection, site not specified 03/14/14  . Delirium 03/14/14  . Sepsis 03/14/14  . Anemia of chronic disease 2014  . Dysphagia 2015      Past Surgical History  Procedure Laterality Date  . Pacemaker insertion  01-04-2010    STJ 2210-RF dual chamber pacemaker implanted by Dr Rayann Heman for CHB  . Back surgery  2000    Spinal stenosis Dr. Achilles Dunk  . Nasal polyp excision    . Tonsillectomy    . Cataract extraction w/ intraocular lens  implant, bilateral  10/2009    Dr. Ellie Lunch  . Pacemaker placement  01/05/2010  . Colonoscopy  07/25/2007    int hem. diverticulosis  Dr. Lajoyce Corners    in for   Chief Complaint  Patient presents with  .  Shortness of Breath     HPI  Nicholas Hood  is a 81 y.o. male, history of mild dementia, deconditioning largely bedbound, essential hypertension, paroxysmal atrial fibrillation on xaralto, bilateral hearing loss, chronic diastolic CHF EF 35% on echo a few years ago, possible stroke in the past who lives in a nursing home, was brought in after he had a fall which was mechanical early this morning, subsequently he was noticed to be very short of breath. Brought to the ER.  In the ER workup suggestive of acute respiratory failure, he was placed on C Pap, he had mild leukocytosis and it was thought that he had HCAP and I was called for admission. Patient is extremely hard of hearing and has early dementia, answers basic questions, denies any headache, no chest pain, agrees to shortness of breath and orthopnea, agrees to bilateral leg edema. Denies any fever or chills. No belly pain or focal weakness.    Review of Systems    In addition to the  HPI above,   No Fever-chills, No Headache, No changes with Vision or hearing, No problems swallowing food or Liquids, No Chest pain, mild dry cough and positive shortness of breath along with orthopnea.  No Abdominal pain, No Nausea or Vommitting, Bowel movements are regular, No Blood in stool or Urine, No dysuria, No new skin rashes or bruises, No new joints pains-aches,  No new weakness, tingling, numbness in any extremity, No recent weight gain or loss, No polyuria, polydypsia or polyphagia, No significant Mental Stressors.  A full 10 point Review of Systems was done, except as stated above, all other Review of Systems were negative.   Social History History  Substance Use Topics  . Smoking status: Former Smoker -- 67 years    Quit date: 10/23/1937  . Smokeless tobacco: Never Used  . Alcohol Use: No      Family History Family History  Problem Relation Age of Onset  . Coronary artery disease      family history  . Stroke Mother   .  Heart disease Father   . Heart disease Brother   . Heart disease Brother   . Heart disease Brother   . Lung disease Brother       Prior to Admission medications   Medication Sig Start Date End Date Taking? Authorizing Provider  acetaminophen (TYLENOL) 325 MG tablet Take 650 mg by mouth every 4 (four) hours as needed for fever (temp over 100).   Yes Historical Provider, MD  albuterol (PROVENTIL) (2.5 MG/3ML) 0.083% nebulizer solution Take 3 mLs (2.5 mg total) by nebulization every 4 (four) hours as needed for wheezing or shortness of breath. 03/20/14  Yes Ripudeep K Rai, MD  benzonatate (TESSALON) 200 MG capsule Take 1 capsule (200 mg total) by mouth 3 (three) times daily. For cough Patient taking differently: Take 200 mg by mouth 3 (three) times daily as needed for cough. For cough 03/20/14  Yes Ripudeep Krystal Eaton, MD  Calcium Carb-Cholecalciferol (OYSTER SHELL CALCIUM + D) 500-400 MG-UNIT TABS Take 1 tablet by mouth daily.   Yes Historical Provider, MD  cetirizine (ZYRTEC ALLERGY) 10 MG tablet Take 10 mg by mouth daily. To help with itching   Yes Historical Provider, MD  cholecalciferol (VITAMIN D) 1000 UNITS tablet Take 2,000 Units by mouth daily.   Yes Historical Provider, MD  docusate sodium 100 MG CAPS Take 100 mg by mouth 2 (two) times daily. 03/20/14  Yes Ripudeep Krystal Eaton, MD  doxazosin (CARDURA) 2 MG tablet Take 2 mg by mouth daily.   Yes Historical Provider, MD  Glucosamine-Chondroitin 500-400 MG CAPS Take 3 capsules by mouth daily.   Yes Historical Provider, MD  levofloxacin (LEVAQUIN) 750 MG tablet Take 750 mg by mouth daily. For 7 days   Yes Historical Provider, MD  LORazepam (ATIVAN) 0.5 MG tablet Take 0.5 mg by mouth at bedtime. 0.5mg  po qhs.   Yes Historical Provider, MD  LORazepam (ATIVAN) 0.5 MG tablet Take 0.5 mg by mouth every 6 (six) hours as needed for anxiety.   Yes Historical Provider, MD  metoprolol tartrate (LOPRESSOR) 25 MG tablet Take 25 mg by mouth 2 (two) times daily.   Yes  Historical Provider, MD  omeprazole (PRILOSEC) 20 MG capsule Take 20 mg by mouth daily.   Yes Historical Provider, MD  Rivaroxaban (XARELTO) 15 MG TABS tablet Take 15 mg by mouth daily after supper.   Yes Historical Provider, MD  saccharomyces boulardii (FLORASTOR) 250 MG capsule Take 250 mg by mouth 2 (  two) times daily. For 7 days   Yes Historical Provider, MD  losartan (COZAAR) 50 MG tablet Take 50 mg by mouth daily. For Hypertention    Historical Provider, MD    Allergies  Allergen Reactions  . Accupril [Quinapril Hcl]     Per MAR  . Alacepril     Per MAR  . Altace [Ramipril]     Unknown reaction   . Hctz [Hydrochlorothiazide]     Unknown reaction  . Inderal [Propranolol]     Per MAR  . Mobic [Meloxicam]     Per MAR  . Monopril [Fosinopril]     Per MAR  . Quinapril Hcl     Unknown reaction  . Reserpine     Per MAR    Physical Exam  Vitals  Blood pressure 120/94, pulse 70, resp. rate 31, SpO2 100 %.   1. General white male lying in bed in mild shortness of breath  2. Normal affect and insight, Not Suicidal or Homicidal, Awake Alert, answers basic questions and follows basic commands  3. No F.N deficits, ALL C.Nerves Intact, Strength 5/5 all 4 extremities, Sensation intact all 4 extremities, Plantars down going.  4. Ears and Eyes appear Normal, Conjunctivae clear, PERRLA. Moist Oral Mucosa.  5. Supple Neck, No JVD, No cervical lymphadenopathy appriciated, No Carotid Bruits.  6. Symmetrical Chest wall movement, Good air movement bilaterally, bilateral rales  7. RRR, No Gallops, Rubs or Murmurs, No Parasternal Heave.  8. Positive Bowel Sounds, Abdomen Soft, No tenderness, No organomegaly appriciated,No rebound -guarding or rigidity.  9.  No Cyanosis, Normal Skin Turgor, No Skin Rash or Bruise. 2+ leg edema  10. Good muscle tone,  joints appear normal , no effusions, Normal ROM.  11. No Palpable Lymph Nodes in Neck or Axillae     Data Review  CBC  Recent  Labs Lab 11/26/14 11/28/14 0458  WBC 10.9 13.1*  HGB 11.3* 11.7*  HCT 32* 36.2*  PLT 207 185  MCV  --  94.3  MCH  --  30.5  MCHC  --  32.3  RDW  --  14.1  LYMPHSABS  --  1.2  MONOABS  --  1.2*  EOSABS  --  0.1  BASOSABS  --  0.0   ------------------------------------------------------------------------------------------------------------------  Chemistries   Recent Labs Lab 11/26/14 11/28/14 0458  NA 139 137  K 4.0 4.0  CL  --  107  CO2  --  24  GLUCOSE  --  145*  BUN 22* 20  CREATININE 0.8 0.90  CALCIUM  --  8.5  AST 9* 16  ALT 8* 8  ALKPHOS 54 58  BILITOT  --  0.7   ------------------------------------------------------------------------------------------------------------------ CrCl cannot be calculated (Unknown ideal weight.). ------------------------------------------------------------------------------------------------------------------ No results for input(s): TSH, T4TOTAL, T3FREE, THYROIDAB in the last 72 hours.  Invalid input(s): FREET3   Coagulation profile  Recent Labs Lab 11/28/14 0458  INR 1.98*   ------------------------------------------------------------------------------------------------------------------- No results for input(s): DDIMER in the last 72 hours. -------------------------------------------------------------------------------------------------------------------  Cardiac Enzymes  Recent Labs Lab 11/28/14 0458  TROPONINI 0.14*   ------------------------------------------------------------------------------------------------------------------ Invalid input(s): POCBNP   ---------------------------------------------------------------------------------------------------------------  Urinalysis    Component Value Date/Time   COLORURINE YELLOW 03/14/2014 1731   APPEARANCEUR CLOUDY* 03/14/2014 1731   LABSPEC 1.018 03/14/2014 1731   PHURINE 5.0 03/14/2014 1731   GLUCOSEU NEGATIVE 03/14/2014 1731   HGBUR LARGE* 03/14/2014  1731   BILIRUBINUR NEGATIVE 03/14/2014 1731   [place] 03/14/2014 1731   PROTEINUR NEGATIVE 03/14/2014 1731   UROBILINOGEN  0.2 03/14/2014 1731   NITRITE NEGATIVE 03/14/2014 1731   LEUKOCYTESUR SMALL* 03/14/2014 1731    ----------------------------------------------------------------------------------------------------------------  Imaging results:   Dg Chest Port 1 View  11/28/2014   CLINICAL DATA:  81 year old male with shortness of breath.  EXAM: PORTABLE CHEST - 1 VIEW  COMPARISON:  03/17/2014  FINDINGS: Dual lead left-sided pacemaker remains in place. Cardiomegaly and tortuous thoracic aorta, unchanged from prior exam. Pulmonary vasculature is normal. No confluent airspace disease. Scattered calcified granuloma are unchanged from prior exam. There is no pleural effusion or pneumothorax. No acute osseous abnormality.  IMPRESSION: No acute pulmonary process.   Electronically Signed   By: Jeb Levering M.D.   On: 11/28/2014 05:37    My personal review of EKG: Rhythm LBBB    Assessment & Plan   1. Acute respiratory failure due to acute on chronic diastolic CHF. Cannot rule out early HCAP. Will be admitted to a MedSurg bed, IV Lasix, blood cultures, sputum cultures, strep pneumo and leogenella antigen, empiric IV antibiotics, feeding assistance, aspiration precautions, speech eval. Oxygen and supportive care. I have taken him off of BiPAP and he is tolerating nonrebreather fine now, I'm hopeful that I can tolerate nasal cannula oxygen. Discussed with son, if he declines full comfort care. Influenza panel ordered and pending.    2. Acute on chronic diastolic CHF. Plan is #1 above, IV Lasix, beta blocker, nitro paste, repeat echo.    3. Mildly elevated troponin. Due to demand ischemia from #1 and #2. We'll cycle troponin to trend and for prognostic purposes, echogram, continue xaralto will not add aspirin due to bleeding risk, he is chest pain-free. Left bundle branch block  has been present previously. No telemetry as management will not change.    4. Paroxysmal atrial fibrillation. Continue beta blocker xaralto. Monitor.    5. Early dementia. At risk for delirium. Explain to the son. Avoid benzodiazepines, minimize narcotics.    6. Essential hypertension. For now low dose beta blocker and Nitropaste and monitor.    7. His tree of prostate cancer. Outpatient follow-up with PCP and urologist.     DVT Prophylaxis Coumadin/SCDs  AM Labs Ordered, also please review Full Orders  Family Communication: Admission, patients condition and plan of care including tests being ordered have been discussed with the patient and son who indicate understanding and agree with the plan and Code Status.  Code Status DNR  Likely DC to SNF  Condition GUARDED   Time spent in minutes : 45    Herchel Hopkin K M.D on 11/28/2014 at 7:59 AM  Between 7am to 7pm - Pager - [phone]  After 7pm go to [email or web address] - [facility] Office  [phone]

## 2014-11-28 NOTE — Progress Notes (Signed)
RR down to 40's from 60's earlier today, shallow but dont look like SOB, he appears to be resting well.  Pt keeps taking off NRB mask.  Switched to 5 liters cannula with sat maintaining at 96%.

## 2014-11-28 NOTE — ED Notes (Signed)
Patient from [facility]. Patient was diagnosed with and began treatment for pneumonia yesterday at his facility. Approx 1 hour ago, patient began breathing 40x per minute. Staff states they gave a breathing tx and tylenol for his fever. Clear lung sounds in upper lobes, but diminished in lower lobes. HR 80 irreg, 165/85, 96% on 2L via [place], and 99% on aerosol mask.

## 2014-11-28 NOTE — ED Provider Notes (Signed)
CSN: [id number]     Arrival date & time 11/28/14  0451 History   First MD Initiated Contact with Patient 11/28/14 [phone]     Chief Complaint  Patient presents with  . Shortness of Breath     (Consider location/radiation/quality/duration/timing/severity/associated sxs/prior Treatment) HPI  81 year old male with a history of dementia per the son presents with dyspnea this started last night. The patient was being started on Levaquin by the nursing facility for pneumonia diagnosed at the facility. The patient became acutely short of breath while at the facility and was sent to the ER. He's been getting albuterol and has had one dose of Levaquin. Per the EMS the patient had a fever at the nursing home and was given Tylenol prior to transport. The patient is unable to provide any history but has had a 1 day history of coughing and is coughing here in the ER. Patient answers yes and no to some questions but otherwise does not talk much. The son states according to him that he does not think the patient is altered and seems to recognize the son. The patient is a DO NOT RESUSCITATE. EMS notes patient's work of breathing has improved since they gave him 2 duonebs. No hypoxia. Not on oxygen.  Past Medical History  Diagnosis Date  . Hypertension   . Prostate cancer   . Spinal stenosis   . Peripheral neuropathy   . Diverticulosis   . Complete heart block     s/p PPM  . Vitamin D deficiency   . Paroxysmal atrial fibrillation   . Congestive heart failure, unspecified 03/05/2012  . Unspecified hypertrophic and atrophic condition of skin 01/16/2012  . Sebaceous cyst 01/16/2012  . Unspecified constipation 11/28/2011  . Other malaise and fatigue 01/28/2008  . Nonspecific abnormal electrocardiogram (ECG) (EKG) 01/28/2008  . Internal hemorrhoids without mention of complication 11/22/8655  . Unspecified hearing loss 02/19/2007  . Osteoarthrosis, unspecified whether generalized or localized, unspecified site 02/19/2007   . Abnormality of gait 06/04/1999  . Aortic valve disorders 06/04/1979  . Cerebellar atrophy 03/20/14  . Cerebrovascular disease 03/20/14    small vessell disease  . Urinary tract infection, site not specified 03/14/14  . Delirium 03/14/14  . Sepsis 03/14/14  . Anemia of chronic disease 2014  . Dysphagia 2015   Past Surgical History  Procedure Laterality Date  . Pacemaker insertion  01-04-2010    STJ 2210-RF dual chamber pacemaker implanted by Dr Rayann Heman for CHB  . Back surgery  2000    Spinal stenosis Dr. Achilles Dunk  . Nasal polyp excision    . Tonsillectomy    . Cataract extraction w/ intraocular lens  implant, bilateral  10/2009    Dr. Ellie Lunch  . Pacemaker placement  01/05/2010  . Colonoscopy  07/25/2007    int hem. diverticulosis  Dr. Lajoyce Corners   Family History  Problem Relation Age of Onset  . Coronary artery disease      family history  . Stroke Mother   . Heart disease Father   . Heart disease Brother   . Heart disease Brother   . Heart disease Brother   . Lung disease Brother    History  Substance Use Topics  . Smoking status: Former Smoker -- 15 years    Quit date: 10/23/1937  . Smokeless tobacco: Never Used  . Alcohol Use: No    Review of Systems  Unable to perform ROS: Dementia      Allergies  Accupril; Alacepril; Altace; Hctz; Inderal; Mobic;  Monopril; Quinapril hcl; and Reserpine  Home Medications   Prior to Admission medications   Medication Sig Start Date End Date Taking? Authorizing Provider  acetaminophen (TYLENOL) 325 MG tablet Take 650 mg by mouth every 4 (four) hours as needed for fever (temp over 100).    Historical Provider, MD  albuterol (PROVENTIL) (2.5 MG/3ML) 0.083% nebulizer solution Take 3 mLs (2.5 mg total) by nebulization every 4 (four) hours as needed for wheezing or shortness of breath. 03/20/14   Ripudeep Krystal Eaton, MD  benzonatate (TESSALON) 200 MG capsule Take 1 capsule (200 mg total) by mouth 3 (three) times daily. For cough 03/20/14   Ripudeep Krystal Eaton, MD  Calcium Carb-Cholecalciferol (OYSTER SHELL CALCIUM + D) 500-400 MG-UNIT TABS Take 1 tablet by mouth daily.    Historical Provider, MD  cetirizine (ZYRTEC ALLERGY) 10 MG tablet Take 10 mg by mouth daily. To help with itching    Historical Provider, MD  cholecalciferol (VITAMIN D) 1000 UNITS tablet Take 2,000 Units by mouth daily.    Historical Provider, MD  docusate sodium 100 MG CAPS Take 100 mg by mouth 2 (two) times daily. 03/20/14   Ripudeep Krystal Eaton, MD  doxazosin (CARDURA) 2 MG tablet Take 2 mg by mouth daily.    Historical Provider, MD  Glucosamine-Chondroitin 500-400 MG CAPS Take 3 capsules by mouth daily.    Historical Provider, MD  LORazepam (ATIVAN) 0.5 MG tablet Take 0.5 mg by mouth every 6 (six) hours as needed for anxiety. 0.5mg  po qhs.    Historical Provider, MD  losartan (COZAAR) 50 MG tablet Take 50 mg by mouth daily. For Hypertention    Historical Provider, MD  omeprazole (PRILOSEC) 20 MG capsule Take 20 mg by mouth daily.    Historical Provider, MD  Rivaroxaban (XARELTO) 15 MG TABS tablet Take 15 mg by mouth daily after supper.    Historical Provider, MD   BP 131/83 mmHg  Pulse 70  Resp 25  SpO2 98% Physical Exam  Constitutional: He appears well-developed and well-nourished.  HENT:  Head: Normocephalic and atraumatic.  Right Ear: External ear normal.  Left Ear: External ear normal.  Nose: Nose normal.  Eyes: Right eye exhibits no discharge. Left eye exhibits no discharge.  Neck: Neck supple.  Cardiovascular: Normal rate, regular rhythm, normal heart sounds and intact distal pulses.   Pulmonary/Chest: Effort normal. Tachypnea noted. He has wheezes. He has rales.  Frequent coughing  Abdominal: Soft. There is no tenderness.  Musculoskeletal: He exhibits edema (bilateral lower extremity pitting edema).  Neurological: He is alert. He is disoriented.  Skin: Skin is warm and dry.  Nursing note and vitals reviewed.   ED Course  Procedures (including critical care  time) Labs Review Labs Reviewed  COMPREHENSIVE METABOLIC PANEL - Abnormal; Notable for the following:    Glucose, Bld 145 (*)    Albumin 2.6 (*)    GFR calc non Af Amer 70 (*)    GFR calc Af Amer 81 (*)    All other components within normal limits  BRAIN NATRIURETIC PEPTIDE - Abnormal; Notable for the following:    B Natriuretic Peptide 362.6 (*)    All other components within normal limits  TROPONIN I - Abnormal; Notable for the following:    Troponin I 0.14 (*)    All other components within normal limits  CBC WITH DIFFERENTIAL/PLATELET - Abnormal; Notable for the following:    WBC 13.1 (*)    RBC 3.84 (*)    Hemoglobin 11.7 (*)  HCT 36.2 (*)    Neutrophils Relative % 80 (*)    Neutro Abs 10.6 (*)    Lymphocytes Relative 10 (*)    Monocytes Absolute 1.2 (*)    All other components within normal limits  PROTIME-INR - Abnormal; Notable for the following:    Prothrombin Time 22.6 (*)    INR 1.98 (*)    All other components within normal limits  CULTURE, BLOOD (ROUTINE X 2)  CULTURE, BLOOD (ROUTINE X 2)  INFLUENZA PANEL BY PCR (TYPE A & B, H1N1)  I-STAT CG4 LACTIC ACID, ED    Imaging Review Dg Chest Port 1 View  11/28/2014   CLINICAL DATA:  81 year old male with shortness of breath.  EXAM: PORTABLE CHEST - 1 VIEW  COMPARISON:  03/17/2014  FINDINGS: Dual lead left-sided pacemaker remains in place. Cardiomegaly and tortuous thoracic aorta, unchanged from prior exam. Pulmonary vasculature is normal. No confluent airspace disease. Scattered calcified granuloma are unchanged from prior exam. There is no pleural effusion or pneumothorax. No acute osseous abnormality.  IMPRESSION: No acute pulmonary process.   Electronically Signed   By: Jeb Levering M.D.   On: 11/28/2014 05:37     EKG Interpretation   Date/Time:  Saturday November 28 2014 04:53:39 EST Ventricular Rate:  70 PR Interval:  77 QRS Duration: 154 QT Interval:  472 QTC Calculation: 509 R Axis:   4 Text  Interpretation:  Normal sinus rhythm Short PR interval Left bundle  branch block Baseline wander in lead(s) V3 no significant change since May  2015 Confirmed by Carrissa Taitano  MD, Damarko Stitely ([id number]) on 11/28/2014 6:14:47 AM      MDM   Final diagnoses:  HCAP (healthcare-associated pneumonia)    Patient with COPD exacerbation vs PNA. Given broad antibiotics given he lives in nursing home. Work of breathing improved with nebs and bipap. Discussed with son, patient is a DNR, no intubation or CPR. Given he is on bipap, will need admission to stepdown. Discussed case with Dr Arnoldo Morale. Will hold on ASA after discussing with her, troponin is likely demand from work of breathing    Ephraim Hamburger, MD 11/28/14 [phone]

## 2014-11-29 DIAGNOSIS — R0600 Dyspnea, unspecified: Secondary | ICD-10-CM | POA: Insufficient documentation

## 2014-11-29 DIAGNOSIS — Z515 Encounter for palliative care: Secondary | ICD-10-CM | POA: Insufficient documentation

## 2014-11-29 DIAGNOSIS — J9601 Acute respiratory failure with hypoxia: Secondary | ICD-10-CM

## 2014-11-29 LAB — GLUCOSE, CAPILLARY: Glucose-Capillary: 144 mg/dL — ABNORMAL HIGH (ref 70–99)

## 2014-11-29 LAB — BASIC METABOLIC PANEL
Anion gap: 7 (ref 5–15)
BUN: 28 mg/dL — ABNORMAL HIGH (ref 6–23)
CO2: 28 mmol/L (ref 19–32)
Calcium: 8.5 mg/dL (ref 8.4–10.5)
Chloride: 105 mmol/L (ref 96–112)
Creatinine, Ser: 1.04 mg/dL (ref 0.50–1.35)
GFR calc non Af Amer: 58 mL/min — ABNORMAL LOW (ref >90)
GFR, EST AFRICAN AMERICAN: 68 mL/min — AB (ref >90)
Glucose, Bld: 157 mg/dL — ABNORMAL HIGH (ref 70–99)
POTASSIUM: 3.9 mmol/L (ref 3.5–5.1)
Sodium: 140 mmol/L (ref 135–145)

## 2014-11-29 LAB — CBC
HEMATOCRIT: 33.9 % — AB (ref 39.0–52.0)
Hemoglobin: 11.1 g/dL — ABNORMAL LOW (ref 13.0–17.0)
MCH: 31.8 pg (ref 26.0–34.0)
MCHC: 32.7 g/dL (ref 30.0–36.0)
MCV: 97.1 fL (ref 78.0–100.0)
Platelets: 190 10*3/uL (ref 150–400)
RBC: 3.49 MIL/uL — AB (ref 4.22–5.81)
RDW: 14 % (ref 11.5–15.5)
WBC: 11.9 10*3/uL — ABNORMAL HIGH (ref 4.0–10.5)

## 2014-11-29 MED ORDER — METOPROLOL TARTRATE 1 MG/ML IV SOLN
5.0000 mg | Freq: Two times a day (BID) | INTRAVENOUS | Status: DC
  Administered 2014-11-29 – 2014-12-02 (×7): 5 mg via INTRAVENOUS
  Filled 2014-11-29 (×9): qty 5

## 2014-11-29 MED ORDER — IPRATROPIUM-ALBUTEROL 0.5-2.5 (3) MG/3ML IN SOLN
3.0000 mL | RESPIRATORY_TRACT | Status: DC
  Administered 2014-11-29 – 2014-11-30 (×5): 3 mL via RESPIRATORY_TRACT
  Filled 2014-11-29 (×5): qty 3

## 2014-11-29 MED ORDER — FUROSEMIDE 10 MG/ML IJ SOLN
40.0000 mg | Freq: Two times a day (BID) | INTRAMUSCULAR | Status: DC
  Administered 2014-11-29 – 2014-11-30 (×3): 40 mg via INTRAVENOUS
  Filled 2014-11-29 (×6): qty 4

## 2014-11-29 MED ORDER — MORPHINE SULFATE 2 MG/ML IJ SOLN
1.0000 mg | INTRAMUSCULAR | Status: DC | PRN
  Administered 2014-11-29: 1 mg via INTRAVENOUS
  Filled 2014-11-29: qty 1

## 2014-11-29 MED ORDER — METHYLPREDNISOLONE SODIUM SUCC 125 MG IJ SOLR
60.0000 mg | Freq: Three times a day (TID) | INTRAMUSCULAR | Status: DC
  Administered 2014-11-29 – 2014-12-02 (×9): 60 mg via INTRAVENOUS
  Filled 2014-11-29 (×4): qty 0.96
  Filled 2014-11-29: qty 2
  Filled 2014-11-29 (×2): qty 0.96
  Filled 2014-11-29: qty 2
  Filled 2014-11-29: qty 0.96
  Filled 2014-11-29: qty 2
  Filled 2014-11-29 (×2): qty 0.96

## 2014-11-29 MED ORDER — LORAZEPAM 2 MG/ML IJ SOLN
0.5000 mg | INTRAMUSCULAR | Status: DC | PRN
  Administered 2014-11-29 – 2014-12-01 (×4): 0.5 mg via INTRAVENOUS
  Filled 2014-11-29 (×4): qty 1

## 2014-11-29 MED ORDER — PANTOPRAZOLE SODIUM 40 MG IV SOLR
40.0000 mg | Freq: Two times a day (BID) | INTRAVENOUS | Status: DC
  Administered 2014-11-29 – 2014-12-04 (×10): 40 mg via INTRAVENOUS
  Filled 2014-11-29 (×11): qty 40

## 2014-11-29 MED ORDER — ALBUTEROL SULFATE (2.5 MG/3ML) 0.083% IN NEBU
INHALATION_SOLUTION | RESPIRATORY_TRACT | Status: AC
  Administered 2014-11-29: 2.5 mg
  Filled 2014-11-29: qty 3

## 2014-11-29 MED ORDER — FUROSEMIDE 10 MG/ML IJ SOLN: 40.0000 mg | Freq: Two times a day (BID) | INTRAMUSCULAR | Status: DC

## 2014-11-29 MED ORDER — ALBUTEROL SULFATE (2.5 MG/3ML) 0.083% IN NEBU: 2.5000 mg | INHALATION_SOLUTION | RESPIRATORY_TRACT | Status: DC | PRN

## 2014-11-29 MED ORDER — MORPHINE SULFATE 2 MG/ML IJ SOLN
2.0000 mg | INTRAMUSCULAR | Status: DC | PRN
  Administered 2014-11-29: 2 mg via INTRAVENOUS
  Administered 2014-11-29: 1 mg via INTRAVENOUS
  Administered 2014-11-30 (×5): 2 mg via INTRAVENOUS
  Administered 2014-12-01: 4 mg via INTRAVENOUS
  Filled 2014-11-29 (×5): qty 1
  Filled 2014-11-29: qty 2
  Filled 2014-11-29: qty 1

## 2014-11-29 MED ORDER — ENOXAPARIN SODIUM 100 MG/ML ~~LOC~~ SOLN
1.0000 mg/kg | Freq: Two times a day (BID) | SUBCUTANEOUS | Status: DC
  Administered 2014-11-29: 95 mg via SUBCUTANEOUS
  Filled 2014-11-29 (×2): qty 1

## 2014-11-29 NOTE — Progress Notes (Signed)
SLP Cancellation Note  Patient Details Name: Nicholas Hood MRN: [id number] DOB: [birth year]   Cancelled treatment:       Reason Eval/Treat Not Completed: Medical issues which prohibited therapy. Pt in respiratory distress this am. Will f/u for readiness.    Shanayah Kaffenberger, Katherene Ponto 11/29/2014, 10:10 AM

## 2014-11-29 NOTE — Significant Event (Signed)
Rapid Response Event Note  Overview: Time Called: 0845 Arrival Time: 0850 Event Type: Respiratory  Initial Focused Assessment:  Called by Respiratory therapy to evaluate patient.  Upon my arrival to patients room, Rn and MD and RT at bedside.  RT nt suctioning nothing obtained.  Patient sitting in bed with increased WOB RR 40's, in distress, use of accessory muscles sats 86% on nasal cannula, switched to NRB sats 90's.  Morphine, lasix and ativan given.     Interventions:  Bipap started, patient agitated pulling mask off, stating "take it off".  Switched to cpap and patient did not tolerate at all and switched back to bipap.  Bipap on for about 25 minutes, patient still agitated WOB decreased slightly, RR 30's, 159/91, Sats 99, HR 72.  Family updated by MD.     Event Summary:   at      at          Kennedy Bucker

## 2014-11-29 NOTE — Consult Note (Signed)
Patient Nicholas Hood      DOB: April 12, 1916      YTK:[id number]     Consult Note from the Palliative Medicine Team at [place] Requested by: Dr Tyrell Antonio     PCP: Estill Dooms, MD Reason for Consultation:Goals of Care, symptoms     Phone Number:[phone]  Assessment/Recommendations: 81 yo male with PMHx of dementia, complete HB s/p pacemaker, Afib on xarelto, admitted from [facility] with mechanical fall and dyspnea.  Acute hypoxic resp failure.   1.  Code Status: DNR documented  2. GOC: Met with son Nicholas Hood this morning.  Talked through issues [name] is facing. Certainly very concerning that he is back in resp distress this morning requiring BiPAP.  At this point, Auryn remains hopeful that IV diuresis/abx will resolve acute issues.  A little surprising that his CXR does not appear worse and he had this setback with 1L fluid removed.  Certainly concerning for other etiology but he is too unstable for CT at this point.  Unusual if PE given that he is on xarelto at home. Ultimately Nicholas Hood wants to continue BiPAP at this time and is at bedside helping nurses keep this on.  Where I informed Nicholas Hood about how difficult this will be is if [name] continues to try and remove and gets more agitated. If we have to sedate him to tolerate, I think risk of aspiration will outweigh benefit of BiPAP and we should not do it. Nicholas Hood is hopeful that in few hours he can safely come off NIPPV again.  If recurrent resp distress in this situation, I informed Nicholas Hood that this will be a poor prognostic sign and that we should strongly consider full comfort care.  Banner has daughter in [place] who Nicholas Hood is calling and has goal of getting here as soon as possible in case transition to comfort care needed.  I also told Nicholas Hood, that we should consider comfort if he continues to remain on BiPAP into tonight/tomrrow and not improving or worsening.   3. Symptom Management:   1. Dyspnea- Agree with PRN morphine order.  Would  try to avoid benzos as much as possibel given he is 26 with dementia. Likely to worsen encephalopathy.  If transitioning to comfort care, would change frequency of bolus dosing to q51mn PRN and may need to start infusion if symptoms remain difficult to control. I am available by phone if questions or concerns arise.   4. Psychosocial/Spiritual: 2 children. Lives in ECF. Dementia for past several years but recognizes son.  Non-ambulatory at baseline. 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  I am not sure of circumstance surrounding fall from chart and patient on BiPAP, unable to provide history. Also noted to be very short of breath.  Found to be in acute hypoxic resp failure in ED requiring CPAP.  Concern for HCAP/bronchospasm/CHF exacerbation.  Evidence of at least mild demand ischemia as well.  Transitioned to nasal cannula, but this morning developed worsening hypoxia and required BiPAP again to maintain O2 sats (reportedly desated into 80's on NRB).  Palliative consulted to assist with goals of care and symptom management.    PMH:  Past Medical History  Diagnosis Date  . Hypertension   . Prostate cancer   . Spinal stenosis   . Peripheral neuropathy   .  Diverticulosis   . Complete heart block     s/p PPM  . Vitamin D deficiency   . Paroxysmal atrial fibrillation   . Congestive heart failure, unspecified 03/05/2012  . Unspecified hypertrophic and atrophic condition of skin 01/16/2012  . Sebaceous cyst 01/16/2012  . Unspecified constipation 11/28/2011  . Other malaise and fatigue 01/28/2008  . Nonspecific abnormal electrocardiogram (ECG) (EKG) 01/28/2008  . Internal hemorrhoids without mention of complication 2/83/1517  . Unspecified hearing loss 02/19/2007  . Osteoarthrosis, unspecified whether generalized or localized,  unspecified site 02/19/2007  . Abnormality of gait 06/04/1999  . Aortic valve disorders 06/04/1979  . Cerebellar atrophy 03/20/14  . Cerebrovascular disease 03/20/14    small vessell disease  . Urinary tract infection, site not specified 03/14/14  . Delirium 03/14/14  . Sepsis 03/14/14  . Anemia of chronic disease 2014  . Dysphagia 2015  . Presence of permanent cardiac pacemaker      PSH: Past Surgical History  Procedure Laterality Date  . Pacemaker insertion  01-04-2010    STJ 2210-[id number] dual chamber pacemaker implanted by Dr Rayann Heman for CHB  . Back surgery  2000    Spinal stenosis Dr. Achilles Dunk  . Nasal polyp excision    . Tonsillectomy    . Cataract extraction w/ intraocular lens  implant, bilateral  10/2009    Dr. Ellie Lunch  . Pacemaker placement  01/05/2010  . Colonoscopy  07/25/2007    int hem. diverticulosis  Dr. Lajoyce Corners   I have reviewed the [facility] and [facility] and  If appropriate update it with new information. Allergies  Allergen Reactions  . Accupril [Quinapril Hcl]     Per MAR  . Alacepril     Per MAR  . Altace [Ramipril]     Unknown reaction   . Hctz [Hydrochlorothiazide]     Unknown reaction  . Inderal [Propranolol]     Per MAR  . Mobic [Meloxicam]     Per MAR  . Monopril [Fosinopril]     Per MAR  . Quinapril Hcl     Unknown reaction  . Reserpine     Per MAR   Scheduled Meds: . docusate sodium  100 mg Oral BID  . doxazosin  2 mg Oral Daily  . furosemide  40 mg Intravenous Daily  . ipratropium-albuterol  3 mL Nebulization Q4H  . methylPREDNISolone (SOLU-MEDROL) injection  60 mg Intravenous 3 times per day  . metoprolol tartrate  25 mg Oral BID  . pantoprazole  40 mg Oral Daily  . piperacillin-tazobactam (ZOSYN)  IV  3.375 g Intravenous 3 times per day  . rivaroxaban  15 mg Oral Q supper  . saccharomyces boulardii  250 mg Oral BID  . vancomycin  1,000 mg Intravenous Q24H   Continuous Infusions:  PRN Meds:.albuterol, guaiFENesin-dextromethorphan, HYDROcodone-acetaminophen,  LORazepam, morphine injection, ondansetron **OR** ondansetron (ZOFRAN) IV, polyethylene glycol    BP 167/102 mmHg  Pulse 69  Temp(Src) 98.3 F (36.8 C) (Axillary)  Resp 29  Ht 5' 7"  (1.702 m)  Wt 94.53 kg (208 lb 6.4 oz)  BMI 32.63 kg/m2  SpO2 99%     Intake/Output Summary (Last 24 hours) at 11/29/14 0919 Last data filed at 11/28/14 1945  Gross per 24 hour  Intake      0 ml  Output   1101 ml  Net  -1101 ml    Physical Exam:  General: Alert, on BiPAP, asking to take off HEENT: , sclera anicteric Neck: supple Chest:   CTAB CVS: regular  rate Abdomen: soft, ND Ext: edematous Skin: warm  Labs: CBC    Component Value Date/Time   WBC 11.9* 11/29/2014 0435   WBC 10.9 11/26/2014   RBC 3.49* 11/29/2014 0435   HGB 11.1* 11/29/2014 0435   HCT 33.9* 11/29/2014 0435   PLT 190 11/29/2014 0435   MCV 97.1 11/29/2014 0435   MCH 31.8 11/29/2014 0435   MCHC 32.7 11/29/2014 0435   RDW 14.0 11/29/2014 0435   LYMPHSABS 1.2 11/28/2014 0458   MONOABS 1.2* 11/28/2014 0458   EOSABS 0.1 11/28/2014 0458   BASOSABS 0.0 11/28/2014 0458    BMET    Component Value Date/Time   NA 140 11/29/2014 0435   NA 139 11/26/2014   K 3.9 11/29/2014 0435   CL 105 11/29/2014 0435   CO2 28 11/29/2014 0435   GLUCOSE 157* 11/29/2014 0435   BUN 28* 11/29/2014 0435   BUN 22* 11/26/2014   CREATININE 1.04 11/29/2014 0435   CREATININE 0.8 11/26/2014   CALCIUM 8.5 11/29/2014 0435   GFRNONAA 58* 11/29/2014 0435   GFRAA 68* 11/29/2014 0435    CMP     Component Value Date/Time   NA 140 11/29/2014 0435   NA 139 11/26/2014   K 3.9 11/29/2014 0435   CL 105 11/29/2014 0435   CO2 28 11/29/2014 0435   GLUCOSE 157* 11/29/2014 0435   BUN 28* 11/29/2014 0435   BUN 22* 11/26/2014   CREATININE 1.04 11/29/2014 0435   CREATININE 0.8 11/26/2014   CALCIUM 8.5 11/29/2014 0435   PROT 7.5 11/28/2014 0458   ALBUMIN 2.6* 11/28/2014 0458   AST 16 11/28/2014 0458   ALT 8 11/28/2014 0458   ALKPHOS 58  11/28/2014 0458   BILITOT 0.7 11/28/2014 0458   GFRNONAA 58* 11/29/2014 0435   GFRAA 68* 11/29/2014 0435   2/6 CXR IMPRESSION: No acute pulmonary process.  Total Time: 70 minutes Greater than 50%  of this time was spent counseling and coordinating care related to the above assessment and plan.  Doran Clay D.O. Palliative Medicine Team at [facility]  Pager: [phone] Team Phone: [phone]

## 2014-11-29 NOTE — Progress Notes (Signed)
Called 3S to give report. Awaiting response.

## 2014-11-29 NOTE — Progress Notes (Signed)
Patient was struggling, trying to remove BIPAP mask.  Mask, removed pt placed on 2L [place].  Son, [name] at bedside, agrees that patient is much improved from this morning and seems more comfortable on the nasal cannula. Although RR is around 33, patient does not have labored respirations.  RN notified.

## 2014-11-29 NOTE — Progress Notes (Signed)
Pt. confused, combative pulled IV out, removed tele leads after numerous times being replaced. Central tele called to suspend till pt. becomes co-operative

## 2014-11-29 NOTE — Progress Notes (Signed)
Patient transferred to 3S07. Report given to [place], Therapist, sports. All belongings brought with patient. Family aware of transfer.

## 2014-11-29 NOTE — Progress Notes (Signed)
[facility] PROGRESS NOTE  CHRISTAPHER Hood ERD:[id number] DOB: Jul 28, 1916 DOA: 11/28/2014 PCP: Estill Dooms, MD  Assessment/Plan: 1-Acute Hypoxic Respiratory Failure;  Multifactorial, PNA, bronchospasm. HF IV solumedrol, continue with IV antibiotics, nebulizer treatments.  Suctioning as possible.  IV morphine for increase work of breathing. Son understand risk.  BIPAP for increase work of breathing, son agree also.  Palliative Care consulted.   2-Acute on Chronic diastolic HF; IV lasix.   3-Mildly elevated troponin. Probably demand ischemia. On xarelto. ECHO ordered.   4-A fib on xarelto, metoprolol.   Code Status: DNR Family Communication: Care discussed with Nicholas Hood, son Disposition Plan: expect 2 to 3 days   Consultants:  Palliative Care.   Procedures:  none  Antibiotics:  Vancomycin 2-7  Zosyn 2-7  HPI/Subjective: Patient with increase work of breathing, SOB.   Objective: Filed Vitals:   11/29/14 0510  BP: 135/80  Pulse: 70  Temp: 97.6 F (36.4 C)  Resp: 21    Intake/Output Summary (Last 24 hours) at 11/29/14 5631 Last data filed at 11/28/14 1945  Gross per 24 hour  Intake      0 ml  Output   1101 ml  Net  -1101 ml   Filed Weights   11/28/14 1443 11/28/14 1751  Weight: 94.983 kg (209 lb 6.4 oz) 94.53 kg (208 lb 6.4 oz)    Exam:   General: Acute distress, tachypnea.   Cardiovascular: S 1, S 2 RRR  Respiratory: decrease breath sound, no wheezing.   Abdomen: BS present, soft, NT  Musculoskeletal: no edema.   Data Reviewed: Basic Metabolic Panel:  Recent Labs Lab 11/26/14 11/28/14 0458 11/29/14 0435  NA 139 137 140  K 4.0 4.0 3.9  CL  --  107 105  CO2  --  24 28  GLUCOSE  --  145* 157*  BUN 22* 20 28*  CREATININE 0.8 0.90 1.04  CALCIUM  --  8.5 8.5   Liver Function Tests:  Recent Labs Lab 11/26/14 11/28/14 0458  AST 9* 16  ALT 8* 8  ALKPHOS 54 58  BILITOT  --  0.7  PROT  --  7.5  ALBUMIN  --  2.6*   No  results for input(s): LIPASE, AMYLASE in the last 168 hours. No results for input(s): AMMONIA in the last 168 hours. CBC:  Recent Labs Lab 11/26/14 11/28/14 0458 11/29/14 0435  WBC 10.9 13.1* 11.9*  NEUTROABS  --  10.6*  --   HGB 11.3* 11.7* 11.1*  HCT 32* 36.2* 33.9*  MCV  --  94.3 97.1  PLT 207 185 190   Cardiac Enzymes:  Recent Labs Lab 11/28/14 0458 11/28/14 0857 11/28/14 1515 11/28/14 2034  TROPONINI 0.14* 0.12* 0.12* 0.13*   BNP (last 3 results)  Recent Labs  11/28/14 0458  BNP 362.6*    ProBNP (last 3 results) No results for input(s): PROBNP in the last 8760 hours.  CBG: No results for input(s): GLUCAP in the last 168 hours.  Recent Results (from the past 240 hour(s))  Blood culture (routine x 2)     Status: None (Preliminary result)   Collection Time: 11/28/14  5:10 AM  Result Value Ref Range Status   Specimen Description BLOOD RIGHT ARM  Final   Special Requests BOTTLES DRAWN AEROBIC AND ANAEROBIC 10CC EACH  Final   Culture   Final           BLOOD CULTURE RECEIVED NO GROWTH TO DATE CULTURE WILL BE HELD FOR 5 DAYS BEFORE ISSUING  A FINAL NEGATIVE REPORT Performed at [facility]    Report Status PENDING  Incomplete  Blood culture (routine x 2)     Status: None (Preliminary result)   Collection Time: 11/28/14  5:20 AM  Result Value Ref Range Status   Specimen Description BLOOD RIGHT HAND  Final   Special Requests BOTTLES DRAWN AEROBIC ONLY 10CC  Final   Culture   Final           BLOOD CULTURE RECEIVED NO GROWTH TO DATE CULTURE WILL BE HELD FOR 5 DAYS BEFORE ISSUING A FINAL NEGATIVE REPORT Performed at [facility]    Report Status PENDING  Incomplete  MRSA PCR Screening     Status: None   Collection Time: 11/28/14 10:20 AM  Result Value Ref Range Status   MRSA by PCR NEGATIVE NEGATIVE Final    Comment:        The GeneXpert MRSA Assay (FDA approved for NASAL specimens only), is one component of a comprehensive MRSA  colonization surveillance program. It is not intended to diagnose MRSA infection nor to guide or monitor treatment for MRSA infections.      Studies: Dg Chest Port 1 View  11/28/2014   CLINICAL DATA:  81 year old male with shortness of breath.  EXAM: PORTABLE CHEST - 1 VIEW  COMPARISON:  03/17/2014  FINDINGS: Dual lead left-sided pacemaker remains in place. Cardiomegaly and tortuous thoracic aorta, unchanged from prior exam. Pulmonary vasculature is normal. No confluent airspace disease. Scattered calcified granuloma are unchanged from prior exam. There is no pleural effusion or pneumothorax. No acute osseous abnormality.  IMPRESSION: No acute pulmonary process.   Electronically Signed   By: Jeb Levering M.D.   On: 11/28/2014 05:37    Scheduled Meds: . albuterol      . docusate sodium  100 mg Oral BID  . doxazosin  2 mg Oral Daily  . furosemide  40 mg Intravenous Daily  . metoprolol tartrate  25 mg Oral BID  . pantoprazole  40 mg Oral Daily  . piperacillin-tazobactam (ZOSYN)  IV  3.375 g Intravenous 3 times per day  . rivaroxaban  15 mg Oral Q supper  . saccharomyces boulardii  250 mg Oral BID  . vancomycin  1,000 mg Intravenous Q24H   Continuous Infusions:   Principal Problem:   HCAP (healthcare-associated pneumonia) Active Problems:   Essential hypertension, benign   PROSTATE CANCER   Atrial fibrillation   Edema   SOB (shortness of breath)   Protein-calorie malnutrition   Acute respiratory failure    Time spent: 35 Minutes.     Nicholas Hood A  [facility] Pager [phone]. If 7PM-7AM, please contact night-coverage at [email or web address], password [facility] 11/29/2014, 8:22 AM  LOS: 1 day

## 2014-11-29 NOTE — Progress Notes (Signed)
Called to patients room Stat for NTS evaluation. MD at bedside. Pt in respiratory distress. NTS'd pt with only a small amount of clear secretions. MD ordered Bipap. Rapid response RN called for pt evaluation. Pt placed on Bipap & pt is very irritated.

## 2014-11-29 NOTE — Progress Notes (Signed)
Upon morning assessment, patient had shallow, labored breathing.  Respiration rate elevated.  Unable to verbalize and speak with RN, but follows commands.  MD at bedside aware, RT and Rapid Response RN also notified. Medications administered per MD orders and patient placed on BiPap due to decreasing O2 sats and labored breathing.  Rapid Response RN at bedside with patient after BiPap initiated.  Awaiting stepdown bed for transfer. Will continue to monitor.

## 2014-11-29 NOTE — Progress Notes (Signed)
ANTICOAGULATION CONSULT NOTE - Initial Consult  Pharmacy Consult for lovenox Indication: atrial fibrillation  Allergies  Allergen Reactions  . Accupril [Quinapril Hcl]     Per MAR  . Alacepril     Per MAR  . Altace [Ramipril]     Unknown reaction   . Hctz [Hydrochlorothiazide]     Unknown reaction  . Inderal [Propranolol]     Per MAR  . Mobic [Meloxicam]     Per MAR  . Monopril [Fosinopril]     Per MAR  . Quinapril Hcl     Unknown reaction  . Reserpine     Per [facility]    Patient Measurements: Height: 5\' 7"  (170.2 cm) Weight: 208 lb 6.4 oz (94.53 kg) IBW/kg (Calculated) : 66.1  Vital Signs: Temp: 98.3 F (36.8 C) (02/07 0845) Temp Source: Axillary (02/07 0845) BP: 159/91 mmHg (02/07 0909) Pulse Rate: 70 (02/07 1219)  Labs:  Recent Labs  11/28/14 0458 11/28/14 0857 11/28/14 1515 11/28/14 2034 11/29/14 0435  HGB 11.7*  --   --   --  11.1*  HCT 36.2*  --   --   --  33.9*  PLT 185  --   --   --  190  LABPROT 22.6*  --   --   --   --   INR 1.98*  --   --   --   --   CREATININE 0.90  --   --   --  1.04  TROPONINI 0.14* 0.12* 0.12* 0.13*  --     Estimated Creatinine Clearance: 45.5 mL/min (by C-G formula based on Cr of 1.04).   Medical History: Past Medical History  Diagnosis Date  . Hypertension   . Prostate cancer   . Spinal stenosis   . Peripheral neuropathy   . Diverticulosis   . Complete heart block     s/p PPM  . Vitamin D deficiency   . Paroxysmal atrial fibrillation   . Congestive heart failure, unspecified 03/05/2012  . Unspecified hypertrophic and atrophic condition of skin 01/16/2012  . Sebaceous cyst 01/16/2012  . Unspecified constipation 11/28/2011  . Other malaise and fatigue 01/28/2008  . Nonspecific abnormal electrocardiogram (ECG) (EKG) 01/28/2008  . Internal hemorrhoids without mention of complication 3/82/5053  . Unspecified hearing loss 02/19/2007  . Osteoarthrosis, unspecified whether generalized or localized, unspecified site 02/19/2007   . Abnormality of gait 06/04/1999  . Aortic valve disorders 06/04/1979  . Cerebellar atrophy 03/20/14  . Cerebrovascular disease 03/20/14    small vessell disease  . Urinary tract infection, site not specified 03/14/14  . Delirium 03/14/14  . Sepsis 03/14/14  . Anemia of chronic disease 2014  . Dysphagia 2015  . Presence of permanent cardiac pacemaker     Medications:  Prescriptions prior to admission  Medication Sig Dispense Refill Last Dose  . acetaminophen (TYLENOL) 325 MG tablet Take 650 mg by mouth every 4 (four) hours as needed for fever (temp over 100).   11/26/2014  . albuterol (PROVENTIL) (2.5 MG/3ML) 0.083% nebulizer solution Take 3 mLs (2.5 mg total) by nebulization every 4 (four) hours as needed for wheezing or shortness of breath. 75 mL 12 11/27/2014 at Unknown time  . benzonatate (TESSALON) 200 MG capsule Take 1 capsule (200 mg total) by mouth 3 (three) times daily. For cough (Patient taking differently: Take 200 mg by mouth 3 (three) times daily as needed for cough. For cough) 30 capsule 0 11/27/2014 at Unknown time  . Calcium Carb-Cholecalciferol (OYSTER SHELL CALCIUM + D)  500-400 MG-UNIT TABS Take 1 tablet by mouth daily.   11/27/2014 at Unknown time  . cetirizine (ZYRTEC ALLERGY) 10 MG tablet Take 10 mg by mouth daily. To help with itching   11/27/2014 at Unknown time  . cholecalciferol (VITAMIN D) 1000 UNITS tablet Take 2,000 Units by mouth daily.   11/27/2014 at Unknown time  . docusate sodium 100 MG CAPS Take 100 mg by mouth 2 (two) times daily. 30 capsule 0 11/27/2014 at Unknown time  . doxazosin (CARDURA) 2 MG tablet Take 2 mg by mouth daily.   11/27/2014 at Unknown time  . Glucosamine-Chondroitin 500-400 MG CAPS Take 3 capsules by mouth daily.   11/27/2014 at Unknown time  . levofloxacin (LEVAQUIN) 750 MG tablet Take 750 mg by mouth daily. For 7 days   11/28/2014 at Unknown time  . LORazepam (ATIVAN) 0.5 MG tablet Take 0.5 mg by mouth at bedtime. 0.5mg  po qhs.   11/27/2014 at Unknown time  .  LORazepam (ATIVAN) 0.5 MG tablet Take 0.5 mg by mouth every 6 (six) hours as needed for anxiety.   11/28/2014 at Unknown time  . metoprolol tartrate (LOPRESSOR) 25 MG tablet Take 25 mg by mouth 2 (two) times daily.   11/27/2014 at 2000  . omeprazole (PRILOSEC) 20 MG capsule Take 20 mg by mouth daily.   11/27/2014 at Unknown time  . Rivaroxaban (XARELTO) 15 MG TABS tablet Take 15 mg by mouth daily after supper.   11/27/2014 at Unknown time  . saccharomyces boulardii (FLORASTOR) 250 MG capsule Take 250 mg by mouth 2 (two) times daily. For 7 days   11/27/2014 at Unknown time  . losartan (COZAAR) 50 MG tablet Take 50 mg by mouth daily. For Hypertention   03/14/2014 at Unknown time   Scheduled:  . docusate sodium  100 mg Oral BID  . doxazosin  2 mg Oral Daily  . furosemide  40 mg Intravenous BID  . ipratropium-albuterol  3 mL Nebulization Q4H  . methylPREDNISolone (SOLU-MEDROL) injection  60 mg Intravenous 3 times per day  . metoprolol  5 mg Intravenous Q12H  . pantoprazole (PROTONIX) IV  40 mg Intravenous Q12H  . piperacillin-tazobactam (ZOSYN)  IV  3.375 g Intravenous 3 times per day  . saccharomyces boulardii  250 mg Oral BID  . vancomycin  1,000 mg Intravenous Q24H    Assessment: 82 YOM presenting with SOB on Xarelto 15 mg daily at [facility] for atrial fibrillation PTA originally continued here now MD wants to switch to lovenox. Xarelto dc'd, last dose 2/6 @ 1638. Hgb 11.1, plts 190 stable. No bleeding noted.  Goal of Therapy:  Anti-Xa level 0.6-1 units/ml 4hrs after LMWH dose given Monitor platelets by anticoagulation protocol: Yes   Plan:  Lovenox 95mg  SQ q12h to begin at 1745 CBC q72hrs Monitor s/sx of bleeding Monitor renal function F/u switch back to xarelto?  Thank you for allowing pharmacy to be part of this patient's care team  Shemiah Rosch M. Alianah Lofton, Pharm.D Clinical Pharmacy Resident Pager: [phone] 11/29/2014 .2:19 PM

## 2014-11-29 NOTE — Progress Notes (Signed)
Utilization review completed.  

## 2014-11-29 NOTE — Progress Notes (Signed)
Pt transported on BIPAP to SDU. No complications noted on transport

## 2014-11-30 DIAGNOSIS — R05 Cough: Secondary | ICD-10-CM

## 2014-11-30 DIAGNOSIS — I480 Paroxysmal atrial fibrillation: Secondary | ICD-10-CM

## 2014-11-30 LAB — CBC
HEMATOCRIT: 39.4 % (ref 39.0–52.0)
HEMOGLOBIN: 12.5 g/dL — AB (ref 13.0–17.0)
MCH: 30.5 pg (ref 26.0–34.0)
MCHC: 31.7 g/dL (ref 30.0–36.0)
MCV: 96.1 fL (ref 78.0–100.0)
Platelets: 207 10*3/uL (ref 150–400)
RBC: 4.1 MIL/uL — AB (ref 4.22–5.81)
RDW: 13.8 % (ref 11.5–15.5)
WBC: 11 10*3/uL — ABNORMAL HIGH (ref 4.0–10.5)

## 2014-11-30 LAB — BASIC METABOLIC PANEL
Anion gap: 9 (ref 5–15)
BUN: 46 mg/dL — ABNORMAL HIGH (ref 6–23)
CALCIUM: 8.8 mg/dL (ref 8.4–10.5)
CHLORIDE: 105 mmol/L (ref 96–112)
CO2: 28 mmol/L (ref 19–32)
Creatinine, Ser: 1.43 mg/dL — ABNORMAL HIGH (ref 0.50–1.35)
GFR calc Af Amer: 46 mL/min — ABNORMAL LOW (ref >90)
GFR calc non Af Amer: 40 mL/min — ABNORMAL LOW (ref >90)
Glucose, Bld: 146 mg/dL — ABNORMAL HIGH (ref 70–99)
Potassium: 3.9 mmol/L (ref 3.5–5.1)
Sodium: 142 mmol/L (ref 135–145)

## 2014-11-30 LAB — LEGIONELLA ANTIGEN, URINE

## 2014-11-30 MED ORDER — GUAIFENESIN 100 MG/5ML PO SYRP
200.0000 mg | ORAL_SOLUTION | Freq: Every day | ORAL | Status: DC
  Filled 2014-11-30 (×3): qty 10

## 2014-11-30 MED ORDER — ENOXAPARIN SODIUM 100 MG/ML ~~LOC~~ SOLN
1.0000 mg/kg | Freq: Two times a day (BID) | SUBCUTANEOUS | Status: DC
  Administered 2014-11-30 – 2014-12-01 (×3): 95 mg via SUBCUTANEOUS
  Filled 2014-11-30 (×5): qty 1

## 2014-11-30 MED ORDER — IPRATROPIUM-ALBUTEROL 0.5-2.5 (3) MG/3ML IN SOLN
3.0000 mL | Freq: Three times a day (TID) | RESPIRATORY_TRACT | Status: DC
  Administered 2014-11-30 (×2): 3 mL via RESPIRATORY_TRACT
  Filled 2014-11-30 (×2): qty 3

## 2014-11-30 NOTE — Clinical Social Work Psychosocial (Signed)
Clinical Social Work Department BRIEF PSYCHOSOCIAL ASSESSMENT 11/30/2014  Patient:  Nicholas Hood, Nicholas Hood     Account Number:  [id number]     Admit date:  11/28/2014  Clinical Social Worker:  Marciano Sequin  Date/Time:  11/30/2014 02:04 PM  Referred by:  RN  Date Referred:  11/30/2014 Referred for  SNF Placement   Other Referral:   Interview type:  Family Other interview type:   Pt orient to self only; Pt's son Nicholas Hood [phone]    PSYCHOSOCIAL DATA Living Status:  FACILITY Admitted from facility:  [place] Level of care:  [place] Primary support name:  Nicholas Hood Primary support relationship to patient:  CHILD, ADULT Degree of support available:   Strong Support    CURRENT CONCERNS Current Concerns  Adjusting to illness    Other Concerns:    SOCIAL WORK ASSESSMENT / PLAN CSW met the pt's son Nicholas Hood at the bedside. CSW introduced self and purpose of the visit. Nicholas Hood confirmed the pt is from [facility] SNF. Nicholas Hood reported that he would like his father to return back to [facility] if it is appropriate. CSW explained insurance component and its relation to long-term placement.  CSW answered all questions in which Nicholas Hood inquired about. CSW provided [place] with contact information for further questions. CSW will continue to follow this pt and assist with discharge as needed.   Assessment/plan status:  Psychosocial Support/Ongoing Assessment of Needs Other assessment/ plan:   Information/referral to community resources:     PATIENT'S/FAMILY'S RESPONSE TO CURRENT DIAGNOSE:  Nicholas Hood expressed concerned with the pt's respiratory status. Nicholas Hood reported the pt's breathing has worsened. It appears that Nicholas Hood is doing his best with processing the change with the pt's health status.Prior to the pt decline, he was active at his SNF. It appears that Nicholas Hood is receptive to the MD's recommendations for treatment. Nicholas Hood reported needing  to share the pt's current status with his sister.      PATIENT'S/FAMILY'S RESPONSE TO PLAN OF CARE: Nicholas Hood is receptive of the pt returning back to [facility], but Nicholas Hood would like to gather more information/recommendations from the [place] Team.    Greta Doom, MSW, [place]

## 2014-11-30 NOTE — Evaluation (Signed)
Clinical/Bedside Swallow Evaluation Patient Details  Name: LYSANDER CALIXTE MRN: [id number] Date of Birth: [birth year]  Today's Date: 11/30/2014 Time: SLP Start Time (ACUTE ONLY): 0855 SLP Stop Time (ACUTE ONLY): 0910 SLP Time Calculation (min) (ACUTE ONLY): 15 min  Past Medical History:  Past Medical History  Diagnosis Date  . Hypertension   . Prostate cancer   . Spinal stenosis   . Peripheral neuropathy   . Diverticulosis   . Complete heart block     s/p PPM  . Vitamin D deficiency   . Paroxysmal atrial fibrillation   . Congestive heart failure, unspecified 03/05/2012  . Unspecified hypertrophic and atrophic condition of skin 01/16/2012  . Sebaceous cyst 01/16/2012  . Unspecified constipation 11/28/2011  . Other malaise and fatigue 01/28/2008  . Nonspecific abnormal electrocardiogram (ECG) (EKG) 01/28/2008  . Internal hemorrhoids without mention of complication 1/59/4585  . Unspecified hearing loss 02/19/2007  . Osteoarthrosis, unspecified whether generalized or localized, unspecified site 02/19/2007  . Abnormality of gait 06/04/1999  . Aortic valve disorders 06/04/1979  . Cerebellar atrophy 03/20/14  . Cerebrovascular disease 03/20/14    small vessell disease  . Urinary tract infection, site not specified 03/14/14  . Delirium 03/14/14  . Sepsis 03/14/14  . Anemia of chronic disease 2014  . Dysphagia 2015  . Presence of permanent cardiac pacemaker    Past Surgical History:  Past Surgical History  Procedure Laterality Date  . Pacemaker insertion  01-04-2010    STJ 2210-RF dual chamber pacemaker implanted by Dr Rayann Heman for CHB  . Back surgery  2000    Spinal stenosis Dr. Achilles Dunk  . Nasal polyp excision    . Tonsillectomy    . Cataract extraction w/ intraocular lens  implant, bilateral  10/2009    Dr. Ellie Lunch  . Pacemaker placement  01/05/2010  . Colonoscopy  07/25/2007    int hem. diverticulosis  Dr. Lajoyce Corners   HPI:  81 year old male admitted 11/28/14 from [facility] due to SOB. PMH: mild  dementia, dysphagia. SLP has attempted since admit to complete BSE.   Assessment / Plan / Recommendation Clinical Impression  Oral care completed with suction. Thick dry mucus noted on velum, extending into pharynx. SLP unable to remove mucus at this time. RN notified, and encouraged to continue thorough oral care for removal of mucus, which may be adversely affecting breathing and swallow function and safety. ST to continue to follow for po readiness.     Aspiration Risk  Severe    Diet Recommendation NPO   Medication Administration: Via alternative means    Other  Recommendations Oral Care Recommendations: Oral care Q4 per protocol Other Recommendations: Have oral suction available   Follow Up Recommendations  Skilled Nursing facility    Frequency and Duration min 2x/week  1 week   Pertinent Vitals/Pain Pt moaning, increased WOB.    SLP Swallow Goals  PO readiness   Swallow Study Prior Functional Status   Soft foods, thickened liquids prior to admit per son    General Date of Onset: 11/28/14 HPI: 81 year old male admitted 11/28/14 from [facility] due to SOB. PMH: mild dementia, dysphagia. SLP has attempted since admit to complete BSE. Type of Study: Bedside swallow evaluation Previous Swallow Assessment: BSE 2015. Son reports thickened liquids at facility Diet Prior to this Study: Dysphagia 3 (soft);Nectar-thick liquids Temperature Spikes Noted: No Respiratory Status: Nasal cannula History of Recent Intubation: No Behavior/Cognition: Lethargic;Requires cueing;Doesn't follow directions;Hard of hearing;Decreased sustained attention Oral Cavity - Dentition:  Missing dentition Self-Feeding Abilities: Total assist Baseline Vocal Quality: Clear Volitional Cough: Cognitively unable to elicit Volitional Swallow: Unable to elicit    Oral/Motor/Sensory Function Overall Oral Motor/Sensory Function:  (unable to assess at this time)   Ice Chips Ice chips: Not tested   Thin  Liquid Thin Liquid: Not tested    Nectar Thick Nectar Thick Liquid: Not tested   Honey Thick Honey Thick Liquid: Not tested   Puree Puree: Not tested   Solid   GO   Durwood Dittus B. Quentin Ore [facility], CCC-SLP [phone] Solid: Not tested       Shonna Chock 11/30/2014,9:11 AM

## 2014-11-30 NOTE — Progress Notes (Signed)
[facility] PROGRESS NOTE  WILLEY DUE OAC:[id number] DOB: [birth year] DOA: 11/28/2014 PCP: Estill Dooms, MD  Assessment/Plan: 1-Acute Hypoxic Respiratory Failure;  Multifactorial, PNA, bronchospasm. HF IV solumedrol, continue with IV antibiotics, nebulizer treatments.  Suctioning as possible.  IV morphine for increase work of breathing.  BIPAP for increase work of breathing. If patient is lethargic we wont be able to use BIPAP.  Palliative Care following, help appreciated.  Patient still with dyspnea,   2-Acute on Chronic diastolic HF; IV lasix.   3-Mildly elevated troponin. Probably demand ischemia. Wanship ordered.   4-A fib , metoprolol IV, Xarelto change to Lovenox.    Code Status: DNR Family Communication: Care discussed with Scherrie November, son Disposition Plan: expect 2 to 3 days   Consultants:  Palliative Care.   Procedures:  none  Antibiotics:  Vancomycin 2-7  Zosyn 2-7  HPI/Subjective: Patient at times cough, some work of breathing. Just received morphine. Patient was moaning.   Objective: Filed Vitals:   11/30/14 1555  BP:   Pulse:   Temp: 97.7 F (36.5 C)  Resp:     Intake/Output Summary (Last 24 hours) at 11/30/14 1558 Last data filed at 11/30/14 1554  Gross per 24 hour  Intake    250 ml  Output    350 ml  Net   -100 ml   Filed Weights   11/28/14 1443 11/28/14 1751 11/30/14 0500  Weight: 94.983 kg (209 lb 6.4 oz) 94.53 kg (208 lb 6.4 oz) 92.3 kg (203 lb 7.8 oz)    Exam:   General: Patient appears with less work of breathing than yesterday, not answering questions, eyes open.   Cardiovascular: S 1, S 2 RRR  Respiratory: decrease breath sound, no wheezing.   Abdomen: BS present, soft, NT  Musculoskeletal: no edema.   Data Reviewed: Basic Metabolic Panel:  Recent Labs Lab 11/26/14 11/28/14 0458 11/29/14 0435 11/30/14 0948  NA 139 137 140 142  K 4.0 4.0 3.9 3.9  CL  --  107 105 105  CO2  --  24 28 28   GLUCOSE  --   145* 157* 146*  BUN 22* 20 28* 46*  CREATININE 0.8 0.90 1.04 1.43*  CALCIUM  --  8.5 8.5 8.8   Liver Function Tests:  Recent Labs Lab 11/26/14 11/28/14 0458  AST 9* 16  ALT 8* 8  ALKPHOS 54 58  BILITOT  --  0.7  PROT  --  7.5  ALBUMIN  --  2.6*   No results for input(s): LIPASE, AMYLASE in the last 168 hours. No results for input(s): AMMONIA in the last 168 hours. CBC:  Recent Labs Lab 11/26/14 11/28/14 0458 11/29/14 0435 11/30/14 0948  WBC 10.9 13.1* 11.9* 11.0*  NEUTROABS  --  10.6*  --   --   HGB 11.3* 11.7* 11.1* 12.5*  HCT 32* 36.2* 33.9* 39.4  MCV  --  94.3 97.1 96.1  PLT 207 185 190 207   Cardiac Enzymes:  Recent Labs Lab 11/28/14 0458 11/28/14 0857 11/28/14 1515 11/28/14 2034  TROPONINI 0.14* 0.12* 0.12* 0.13*   BNP (last 3 results)  Recent Labs  11/28/14 0458  BNP 362.6*    ProBNP (last 3 results) No results for input(s): PROBNP in the last 8760 hours.  CBG:  Recent Labs Lab 11/29/14 1952  GLUCAP 144*    Recent Results (from the past 240 hour(s))  Blood culture (routine x 2)     Status: None (Preliminary result)   Collection Time: 11/28/14  5:10 AM  Result Value Ref Range Status   Specimen Description BLOOD RIGHT ARM  Final   Special Requests BOTTLES DRAWN AEROBIC AND ANAEROBIC 10CC EACH  Final   Culture   Final           BLOOD CULTURE RECEIVED NO GROWTH TO DATE CULTURE WILL BE HELD FOR 5 DAYS BEFORE ISSUING A FINAL NEGATIVE REPORT Performed at [facility]    Report Status PENDING  Incomplete  Blood culture (routine x 2)     Status: None (Preliminary result)   Collection Time: 11/28/14  5:20 AM  Result Value Ref Range Status   Specimen Description BLOOD RIGHT HAND  Final   Special Requests BOTTLES DRAWN AEROBIC ONLY 10CC  Final   Culture   Final           BLOOD CULTURE RECEIVED NO GROWTH TO DATE CULTURE WILL BE HELD FOR 5 DAYS BEFORE ISSUING A FINAL NEGATIVE REPORT Performed at [facility]    Report Status  PENDING  Incomplete  MRSA PCR Screening     Status: None   Collection Time: 11/28/14 10:20 AM  Result Value Ref Range Status   MRSA by PCR NEGATIVE NEGATIVE Final    Comment:        The GeneXpert MRSA Assay (FDA approved for NASAL specimens only), is one component of a comprehensive MRSA colonization surveillance program. It is not intended to diagnose MRSA infection nor to guide or monitor treatment for MRSA infections.      Studies: No results found.  Scheduled Meds: . docusate sodium  100 mg Oral BID  . doxazosin  2 mg Oral Daily  . enoxaparin (LOVENOX) injection  1 mg/kg Subcutaneous BID  . furosemide  40 mg Intravenous BID  . guaifenesin  200 mg Oral Daily  . ipratropium-albuterol  3 mL Nebulization TID  . methylPREDNISolone (SOLU-MEDROL) injection  60 mg Intravenous 3 times per day  . metoprolol  5 mg Intravenous Q12H  . pantoprazole (PROTONIX) IV  40 mg Intravenous Q12H  . piperacillin-tazobactam (ZOSYN)  IV  3.375 g Intravenous 3 times per day  . saccharomyces boulardii  250 mg Oral BID  . vancomycin  1,000 mg Intravenous Q24H   Continuous Infusions:   Principal Problem:   HCAP (healthcare-associated pneumonia) Active Problems:   Essential hypertension, benign   PROSTATE CANCER   Atrial fibrillation   Edema   SOB (shortness of breath)   Protein-calorie malnutrition   Acute respiratory failure   Palliative care encounter   Dyspnea    Time spent: 35 Minutes.     Niel Hummer A  [facility] Pager [phone]. If 7PM-7AM, please contact night-coverage at [email or web address], password [facility] 11/30/2014, 3:58 PM  LOS: 2 days

## 2014-11-30 NOTE — Progress Notes (Signed)
Patient Nicholas Hood      DOB: November 22, 1915      BJY:[id number]   Palliative Medicine Team at [facility] Progress Note    Subjective: On Nasal Cannula this morning.  Does not verbalize anything or provide meaningful nonverbal communication with me though very alert.     Filed Vitals:   11/30/14 0816  BP:   Pulse:   Temp: 98 F (36.7 C)  Resp:    Physical exam: GEN: alert, NAD but slight increased WOB HEENT: [place], sclera anicteric, very dry mm and thick secretions CV: Regular rate Lungs: coarse throughout Abd: soft, ND EXT: edema  CBC    Component Value Date/Time   WBC 11.9* 11/29/2014 0435   WBC 10.9 11/26/2014   RBC 3.49* 11/29/2014 0435   HGB 11.1* 11/29/2014 0435   HCT 33.9* 11/29/2014 0435   PLT 190 11/29/2014 0435   MCV 97.1 11/29/2014 0435   MCH 31.8 11/29/2014 0435   MCHC 32.7 11/29/2014 0435   RDW 14.0 11/29/2014 0435   LYMPHSABS 1.2 11/28/2014 0458   MONOABS 1.2* 11/28/2014 0458   EOSABS 0.1 11/28/2014 0458   BASOSABS 0.0 11/28/2014 0458    CMP     Component Value Date/Time   NA 140 11/29/2014 0435   NA 139 11/26/2014   K 3.9 11/29/2014 0435   CL 105 11/29/2014 0435   CO2 28 11/29/2014 0435   GLUCOSE 157* 11/29/2014 0435   BUN 28* 11/29/2014 0435   BUN 22* 11/26/2014   CREATININE 1.04 11/29/2014 0435   CREATININE 0.8 11/26/2014   CALCIUM 8.5 11/29/2014 0435   PROT 7.5 11/28/2014 0458   ALBUMIN 2.6* 11/28/2014 0458   AST 16 11/28/2014 0458   ALT 8 11/28/2014 0458   ALKPHOS 58 11/28/2014 0458   BILITOT 0.7 11/28/2014 0458   GFRNONAA 58* 11/29/2014 0435   GFRAA 68* 11/29/2014 0435     Assessment and plan: 81 yo male with PMHx of dementia, complete HB s/p pacemaker, Afib on xarelto, admitted from [facility] with mechanical fall and dyspnea. Acute hypoxic resp failure.   1. Code Status: DNR documented  2. GOC: See initial consult.  Off BiPAP overnight.  Slight increased work of breathing and his respiratory status remains tenuous.  I spoke  with Nicholas Hood at bedside this morning and informed him that I continue to have concerns over his breathing and my opinion is that he remains high risk for further decline.  Nicholas Hood hopeful that being off BiPAP indicates response to ongoing interventions and that trend will continue.  I encouraged him to really think about what we should do if breathing decompensates again. I think if we are considering BiPAP yet again, that would be an ominious prognostic indicator for him. He will talk with his sister and think about this.  Given Nicholas Hood's poor tolerability of BiPAP I think it would be difficult to send him down that path again.    3. Symptom Management:  1. Dyspnea-Some increased WOB this morning. Bedside nurse appropriately giving PRN morphine this morning. Speech to eval. I would avoid ativan for dyspnea unless it is felt that anxiety is driving SOB.  Otherwise ativan has little proven benefit in dyspnea relief.  2. Cough/Thick Secretions- continue current management. i will add daily dose of guaifenesin to see if we can thin his secretions a little more.continue oral care.    4. Psychosocial/Spiritual: 2 children. Lives in ECF. Dementia for past several years but recognizes son. Non-ambulatory at baseline. 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  Total Time: 30 minutes >50% of time spent in counseling and coordination of care as above  Doran Clay D.O. Palliative Medicine Team at [facility]  Pager: [phone] Team Phone: [phone]

## 2014-12-01 ENCOUNTER — Inpatient Hospital Stay (HOSPITAL_COMMUNITY): Payer: Medicare Other

## 2014-12-01 LAB — CBC
HEMATOCRIT: 38.2 % — AB (ref 39.0–52.0)
HEMOGLOBIN: 12.4 g/dL — AB (ref 13.0–17.0)
MCH: 30.5 pg (ref 26.0–34.0)
MCHC: 32.5 g/dL (ref 30.0–36.0)
MCV: 94.1 fL (ref 78.0–100.0)
Platelets: 221 10*3/uL (ref 150–400)
RBC: 4.06 MIL/uL — AB (ref 4.22–5.81)
RDW: 13.8 % (ref 11.5–15.5)
WBC: 9.4 10*3/uL (ref 4.0–10.5)

## 2014-12-01 LAB — BASIC METABOLIC PANEL
Anion gap: 9 (ref 5–15)
BUN: 55 mg/dL — AB (ref 6–23)
CO2: 30 mmol/L (ref 19–32)
CREATININE: 1.53 mg/dL — AB (ref 0.50–1.35)
Calcium: 8.6 mg/dL (ref 8.4–10.5)
Chloride: 104 mmol/L (ref 96–112)
GFR calc Af Amer: 42 mL/min — ABNORMAL LOW (ref >90)
GFR calc non Af Amer: 37 mL/min — ABNORMAL LOW (ref >90)
Glucose, Bld: 138 mg/dL — ABNORMAL HIGH (ref 70–99)
Potassium: 3.6 mmol/L (ref 3.5–5.1)
Sodium: 143 mmol/L (ref 135–145)

## 2014-12-01 MED ORDER — HYDROMORPHONE HCL 1 MG/ML IJ SOLN
0.2000 mg | INTRAMUSCULAR | Status: DC | PRN
  Administered 2014-12-01 – 2014-12-03 (×8): 0.5 mg via INTRAVENOUS
  Administered 2014-12-04: 07:00:00 via INTRAVENOUS
  Filled 2014-12-01 (×11): qty 1

## 2014-12-01 MED ORDER — FUROSEMIDE 10 MG/ML IJ SOLN: 20.0000 mg | Freq: Every day | INTRAMUSCULAR | Status: DC

## 2014-12-01 MED ORDER — FUROSEMIDE 10 MG/ML IJ SOLN: 40.0000 mg | Freq: Every day | INTRAMUSCULAR | Status: DC

## 2014-12-01 MED ORDER — IPRATROPIUM-ALBUTEROL 0.5-2.5 (3) MG/3ML IN SOLN
3.0000 mL | Freq: Three times a day (TID) | RESPIRATORY_TRACT | Status: DC
  Administered 2014-12-01 – 2014-12-04 (×10): 3 mL via RESPIRATORY_TRACT
  Filled 2014-12-01 (×10): qty 3

## 2014-12-01 MED ORDER — IPRATROPIUM-ALBUTEROL 0.5-2.5 (3) MG/3ML IN SOLN
3.0000 mL | RESPIRATORY_TRACT | Status: DC
  Administered 2014-12-01: 3 mL via RESPIRATORY_TRACT
  Filled 2014-12-01: qty 3

## 2014-12-01 MED ORDER — ENOXAPARIN SODIUM 100 MG/ML ~~LOC~~ SOLN
90.0000 mg | SUBCUTANEOUS | Status: DC
  Administered 2014-12-02 – 2014-12-03 (×2): 90 mg via SUBCUTANEOUS
  Filled 2014-12-01 (×3): qty 1

## 2014-12-01 MED ORDER — BISACODYL 5 MG PO TBEC: 5.0000 mg | DELAYED_RELEASE_TABLET | Freq: Every day | ORAL | Status: DC | PRN

## 2014-12-01 MED ORDER — HYDRALAZINE HCL 20 MG/ML IJ SOLN
10.0000 mg | Freq: Three times a day (TID) | INTRAMUSCULAR | Status: DC | PRN
  Administered 2014-12-03: 10 mg via INTRAVENOUS
  Filled 2014-12-01: qty 1

## 2014-12-01 NOTE — Progress Notes (Signed)
Medicare Important Message given? YES  (If response is "NO", the following Medicare IM given date fields will be blank)  Date Medicare IM given: 12/01/14 Medicare IM given by:  Jimena Wieczorek  

## 2014-12-01 NOTE — Progress Notes (Signed)
SLP Note  Patient Details Name: Nicholas Hood MRN: [id number] DOB: [birth year]   Pt minimally alert per RN. Is receiving a diet tray but pt has not been fed, has not had PO meds given his MS.  SLP to follow next date.  Note family wants to continue restorative therapies if appropriate.         [name] Laurice 12/01/2014, 4:50 PM

## 2014-12-01 NOTE — Progress Notes (Signed)
[facility] PROGRESS NOTE  LELON IKARD ZOX:[id number] DOB: 06-May-1916 DOA: 11/28/2014 PCP: Estill Dooms, MD  Assessment/Plan: 81 year old with CHF, Stroke, Paroxysmal A fib, complete heart Block S/P pacemaker who was admitted with acute respiratory Failure.   1-Acute Hypoxic Respiratory Failure;  Multifactorial, PNA, bronchospasm. HF IV solumedrol, continue with IV antibiotics, nebulizer treatments.  Suctioning as possible.  IV dilaudid for increase work of breathing PRN.  Palliative Care following, help appreciated.  Off BiPAP but still with dyspnea.  Hold IV lasix due to raising cr. Will repeat Chest x ray.   2-Acute on Chronic diastolic HF; Hold lasix, due to raising cr. Will repeat chest x ray.   3-Mildly elevated troponin. Probably demand ischemia. ECHO ordered.   4-A fib , metoprolol IV, Xarelto change to Lovenox.   5-Encephalopathy; non verbal. Suspect multifactorial. Secondary to infection, medication. Might need to get CT head.  6-Anxiety; was on chronic benzo. Continue with ativan PRN.  7-AKI; will hold lasix.  8-HTN; will add PRN hydralazine.   Code Status: DNR Family Communication: Care discussed with Scherrie November, son Disposition Plan: poor prognosis.    Consultants:  Palliative Care.   Procedures:  none  Antibiotics:  Vancomycin 2-7  Zosyn 2-7  HPI/Subjective: Patient at times cough, some work of breathing. He appears more alert but still no responding to questions.   Objective: Filed Vitals:   12/01/14 0700  BP:   Pulse:   Temp: 98.6 F (37 C)  Resp:     Intake/Output Summary (Last 24 hours) at 12/01/14 0847 Last data filed at 12/01/14 0700  Gross per 24 hour  Intake    740 ml  Output   1200 ml  Net   -460 ml   Filed Weights   11/28/14 1751 11/30/14 0500 12/01/14 0500  Weight: 94.53 kg (208 lb 6.4 oz) 92.3 kg (203 lb 7.8 oz) 89.5 kg (197 lb 5 oz)    Exam:   General: Patient with irregular breathing pattern. He is more  alert.   Cardiovascular: S 1, S 2 RRR  Respiratory: decrease breath sound, no wheezing.   Abdomen: BS present, soft, NT  Musculoskeletal: no edema.   Data Reviewed: Basic Metabolic Panel:  Recent Labs Lab 11/26/14 11/28/14 0458 11/29/14 0435 11/30/14 0948 12/01/14 0401  NA 139 137 140 142 143  K 4.0 4.0 3.9 3.9 3.6  CL  --  107 105 105 104  CO2  --  24 28 28 30   GLUCOSE  --  145* 157* 146* 138*  BUN 22* 20 28* 46* 55*  CREATININE 0.8 0.90 1.04 1.43* 1.53*  CALCIUM  --  8.5 8.5 8.8 8.6   Liver Function Tests:  Recent Labs Lab 11/26/14 11/28/14 0458  AST 9* 16  ALT 8* 8  ALKPHOS 54 58  BILITOT  --  0.7  PROT  --  7.5  ALBUMIN  --  2.6*   No results for input(s): LIPASE, AMYLASE in the last 168 hours. No results for input(s): AMMONIA in the last 168 hours. CBC:  Recent Labs Lab 11/26/14 11/28/14 0458 11/29/14 0435 11/30/14 0948 12/01/14 0401  WBC 10.9 13.1* 11.9* 11.0* 9.4  NEUTROABS  --  10.6*  --   --   --   HGB 11.3* 11.7* 11.1* 12.5* 12.4*  HCT 32* 36.2* 33.9* 39.4 38.2*  MCV  --  94.3 97.1 96.1 94.1  PLT 207 185 190 207 221   Cardiac Enzymes:  Recent Labs Lab 11/28/14 0458 11/28/14 0857  11/28/14 1515 11/28/14 2034  TROPONINI 0.14* 0.12* 0.12* 0.13*   BNP (last 3 results)  Recent Labs  11/28/14 0458  BNP 362.6*    ProBNP (last 3 results) No results for input(s): PROBNP in the last 8760 hours.  CBG:  Recent Labs Lab 11/29/14 1952  GLUCAP 144*    Recent Results (from the past 240 hour(s))  Blood culture (routine x 2)     Status: None (Preliminary result)   Collection Time: 11/28/14  5:10 AM  Result Value Ref Range Status   Specimen Description BLOOD RIGHT ARM  Final   Special Requests BOTTLES DRAWN AEROBIC AND ANAEROBIC 10CC EACH  Final   Culture   Final           BLOOD CULTURE RECEIVED NO GROWTH TO DATE CULTURE WILL BE HELD FOR 5 DAYS BEFORE ISSUING A FINAL NEGATIVE REPORT Performed at [facility]    Report  Status PENDING  Incomplete  Blood culture (routine x 2)     Status: None (Preliminary result)   Collection Time: 11/28/14  5:20 AM  Result Value Ref Range Status   Specimen Description BLOOD RIGHT HAND  Final   Special Requests BOTTLES DRAWN AEROBIC ONLY 10CC  Final   Culture   Final           BLOOD CULTURE RECEIVED NO GROWTH TO DATE CULTURE WILL BE HELD FOR 5 DAYS BEFORE ISSUING A FINAL NEGATIVE REPORT Performed at [facility]    Report Status PENDING  Incomplete  MRSA PCR Screening     Status: None   Collection Time: 11/28/14 10:20 AM  Result Value Ref Range Status   MRSA by PCR NEGATIVE NEGATIVE Final    Comment:        The GeneXpert MRSA Assay (FDA approved for NASAL specimens only), is one component of a comprehensive MRSA colonization surveillance program. It is not intended to diagnose MRSA infection nor to guide or monitor treatment for MRSA infections.      Studies: No results found.  Scheduled Meds: . docusate sodium  100 mg Oral BID  . doxazosin  2 mg Oral Daily  . enoxaparin (LOVENOX) injection  1 mg/kg Subcutaneous BID  . guaifenesin  200 mg Oral Daily  . ipratropium-albuterol  3 mL Nebulization TID  . methylPREDNISolone (SOLU-MEDROL) injection  60 mg Intravenous 3 times per day  . metoprolol  5 mg Intravenous Q12H  . pantoprazole (PROTONIX) IV  40 mg Intravenous Q12H  . piperacillin-tazobactam (ZOSYN)  IV  3.375 g Intravenous 3 times per day  . saccharomyces boulardii  250 mg Oral BID  . vancomycin  1,000 mg Intravenous Q24H   Continuous Infusions:   Principal Problem:   HCAP (healthcare-associated pneumonia) Active Problems:   Essential hypertension, benign   PROSTATE CANCER   Atrial fibrillation   Edema   SOB (shortness of breath)   Protein-calorie malnutrition   Acute respiratory failure   Palliative care encounter   Dyspnea    Time spent: 35 Minutes.     Niel Hummer A  [facility] Pager [phone]. If 7PM-7AM,  please contact night-coverage at [email or web address], password [facility] 12/01/2014, 8:47 AM  LOS: 3 days

## 2014-12-01 NOTE — Plan of Care (Signed)
Problem: Phase II Progression Outcomes Goal: IV changed to normal saline lock Outcome: Not Applicable Date Met:  32/25/67 Patient has IV as KVO with antibiotic running

## 2014-12-01 NOTE — Progress Notes (Signed)
Patient Nicholas Hood Strength      DOB: 02/01/1916      BMZ:[id number]   Palliative Medicine Team at [facility] Progress Note    Subjective: Remains unable to communicate with me.  While awake and movers, not following any commands   Filed Vitals:   12/01/14 0700  BP:   Pulse:   Temp: 98.6 F (37 Hood)  Resp:    Physical exam: GEN: alert, NAD, periods of shallow tachypnea followed by brief apnea epsidoes HEENT: [place], sclera anicteric, very dry mm and thick secretions CV: Regular rate Lungs: coarse throughout Abd: soft, ND EXT: edema improved    Assessment and plan: 81 yo male with PMHx of dementia, complete HB s/p pacemaker, Afib on xarelto, admitted from [facility] with mechanical fall and dyspnea. Acute hypoxic resp failure.   1. Code Status: DNR documented  2. GOC: Remains off BiPAP and breathing patters remain tenuous.  Spoke with son again today.  I recommended that at this point, should his breathing decline again, we should avoid BiPAP.  I think this would be an ominous prognostic sing.  [name] agrees with this.  I also informed him that the longer his hospital stay goes on, the more worried I will be about how he does here and in longer term.  [name] would like to continue restorative therapies and assess progress or lack there of in coming days.    I will be off service after today, but my partner Dr Hilma Favors will be aware.    3. Symptom Management:  1. Dyspnea-WOB little better but still irregular and worrisome breathing patterns. Remains high risk for ongoing setbacks/symtpoms. With AKI, I will go ahead and rotate morphine to dilaudid (also works for dyspnea).  May need to d/Hood lasix with worsening renal function. Again I would not use ativan to treat dyspnea, only anxiety 2. Cough/Thick Secretions- continue current management.   4. Psychosocial/Spiritual: 2 children. Lives in ECF. Dementia for past several years but recognizes son. Non-ambulatory at baseline. Former  WWII Psychologist, clinical.   Doran Clay D.O. Palliative Medicine Team at [facility]  Pager: [phone] Team Phone: [phone]

## 2014-12-01 NOTE — Progress Notes (Signed)
ANTIBIOTIC CONSULT NOTE - FOLLOW UP  Pharmacy Consult for Vanco/Zosyn + Lovenox Indication: pneumonia and Afib  Allergies  Allergen Reactions  . Accupril [Quinapril Hcl]     Per MAR  . Alacepril     Per MAR  . Altace [Ramipril]     Unknown reaction   . Hctz [Hydrochlorothiazide]     Unknown reaction  . Inderal [Propranolol]     Per MAR  . Mobic [Meloxicam]     Per MAR  . Monopril [Fosinopril]     Per MAR  . Quinapril Hcl     Unknown reaction  . Reserpine     Per [facility]    Patient Measurements: Height: 5\' 7"  (170.2 cm) Weight: 197 lb 5 oz (89.5 kg) IBW/kg (Calculated) : 66.1  Vital Signs: Temp: 97.7 F (36.5 C) (02/09 1300) Temp Source: Axillary (02/09 1300) BP: 178/128 mmHg (02/09 0942) Pulse Rate: 64 (02/09 0942) Intake/Output from previous day: 02/08 0701 - 02/09 0700 In: 740 [I.V.:40; IV Piggyback:700] Out: 1200 [Urine:1200] Intake/Output from this shift: Total I/O In: -  Out: 2 [Urine:2]  Labs:  Recent Labs  11/29/14 0435 11/30/14 0948 12/01/14 0401  WBC 11.9* 11.0* 9.4  HGB 11.1* 12.5* 12.4*  PLT 190 207 221  CREATININE 1.04 1.43* 1.53*   Estimated Creatinine Clearance: 30.2 mL/min (by C-G formula based on Cr of 1.53). No results for input(s): VANCOTROUGH, VANCOPEAK, VANCORANDOM, GENTTROUGH, GENTPEAK, GENTRANDOM, TOBRATROUGH, TOBRAPEAK, TOBRARND, AMIKACINPEAK, AMIKACINTROU, AMIKACIN in the last 72 hours.   Microbiology:   Anti-infectives    Start     Dose/Rate Route Frequency Ordered Stop   11/29/14 0600  vancomycin (VANCOCIN) IVPB 1000 mg/200 mL premix     1,000 mg200 mL/hr over 60 Minutes Intravenous Every 24 hours 11/28/14 0743     11/28/14 1400  piperacillin-tazobactam (ZOSYN) IVPB 3.375 g     3.375 g12.5 mL/hr over 240 Minutes Intravenous 3 times per day 11/28/14 0758     11/28/14 0600  vancomycin (VANCOCIN) 1,500 mg in sodium chloride 0.9 % 500 mL IVPB     1,500 mg250 mL/hr over 120 Minutes Intravenous  Once 11/28/14 0555 11/29/14 1317    11/28/14 0545  ceFEPIme (MAXIPIME) 1 g in dextrose 5 % 50 mL IVPB     1 g100 mL/hr over 30 Minutes Intravenous  Once 11/28/14 0543 11/28/14 0713      Assessment: 89 YOM admitted 11/28/2014 presenting with SOB on Xarelto 15 mg daily at [facility] for atrial fibrillation.  AntiCoag: Afib, Xarelto 15 mg daily at NH (last dose 2/6). Changed to Lovenox. CBC stable. No bleeding noted. Dose changed due to weight and elevated Scr and age.  Infectious Disease: Vancomycin/Zosyn for PNA,  WBC 9.4 down, Afebrile. Scr up to 1.53. F/u cultures  Vanc 2/6>> Zosyn 2/6>>  2/6 sputum: sent 2/6 blood>>ngtd  Cardiovascular: HTN, PPM, Afib, CHF, BP high 178/128, HR 64. Meds: doxazosin, Lopressor, (hold Lasix for Scr)  Endocrinology: Glucose 138. (on high dose steroids)  Gastrointestinal / Nutrition: PPI, Florastor; dys3   Neurology: H/o CVA. Unresponsive  Nephrology: SCr 1.04>1.43>1.53. Lytes all WNL, CrCl~ 30  Pulmonary: 2L. IV Solumedrol/nebs/abx. Having dyspnea off bipap.  Hematology / Oncology: see [facility]  Best Practices: lovenox, PPI  Goal of Therapy:  Vancomycin trough level 15-20 mcg/ml  Anti-Xa level 0.6-1 units/ml 4hrs after LMWH dose given Monitor platelets by anticoagulation protocol: Yes  Plan:  --Change Lovenox to 90mg  SQ ONCE daily. Change back to Xarelto?  --Zosyn 3.375g IV q8hr. Dose ok.  --Vancomycin 1g  IV q24h. Dose ok. Check dose Thurs/Fri if continued.   Adylene Dlugosz S. Alford Highland, PharmD, [place] Clinical Staff Pharmacist Pager [phone]  Wayland Salinas 12/01/2014,2:37 PM

## 2014-12-01 NOTE — Progress Notes (Signed)
INITIAL NUTRITION ASSESSMENT  DOCUMENTATION CODES Per approved criteria  -Obesity Unspecified   INTERVENTION: Recommend diet per SLP.  No supplements at this time since patient is eating minimal amounts.  NUTRITION DIAGNOSIS: Inadequate oral intake related to altered mental status as evidenced by minimal intake of meals.   Goal: Intake to meet >90% of estimated nutrition needs vs comfort care.  Monitor:  PO intake, labs, weight trend, overall goals of care  Reason for Assessment: Low Braden  81 y.o. male  Admitting Dx: HCAP (healthcare-associated pneumonia)  ASSESSMENT: Patient admitted to the hospital on 2/6 with SOB after a fall at nursing home.  Palliative Care Team is following patient, who is unable to communicate with caregivers and not following commands. Patient with a poor prognosis.   SLP following for swallowing difficulty. SLP recommended NPO on 2/8 due to severe aspiration risk. Remains on a dysphagia 3 diet with nectar thick liquids. SLP to follow up with patient. Per discussion with RN, patient is not eating anything.  Height: Ht Readings from Last 1 Encounters:  11/28/14 5\' 7"  (1.702 m)    Weight: Wt Readings from Last 1 Encounters:  12/01/14 197 lb 5 oz (89.5 kg)    Ideal Body Weight: 67.3 kg  % Ideal Body Weight: 133%  Wt Readings from Last 10 Encounters:  12/01/14 197 lb 5 oz (89.5 kg)  04/06/14 222 lb (100.699 kg)  09/09/13 206 lb (93.441 kg)  07/22/13 206 lb (93.441 kg)  07/08/13 209 lb (94.802 kg)  05/27/13 208 lb (94.348 kg)  04/29/13 206 lb (93.441 kg)  11/21/12 209 lb 6.4 oz (94.983 kg)  12/05/11 207 lb 9.6 oz (94.167 kg)  11/20/11 208 lb (94.348 kg)    Usual Body Weight: 222 lbs (8 months ago)  % Usual Body Weight: 89%  BMI:  Body mass index is 30.9 kg/(m^2).  Estimated Nutritional Needs: Kcal: 1800-2000 Protein: 90-100 gm Fluid: 2 L  Skin: stage 2 pressure ulcer to sacrum  Diet Order: DIET DYS 3 with nectar thick  liquids  EDUCATION NEEDS: -No education needs identified at this time   Intake/Output Summary (Last 24 hours) at 12/01/14 1511 Last data filed at 12/01/14 1300  Gross per 24 hour  Intake    740 ml  Output   1202 ml  Net   -462 ml    Last BM: 2/7   Labs:   Recent Labs Lab 11/29/14 0435 11/30/14 0948 12/01/14 0401  NA 140 142 143  K 3.9 3.9 3.6  CL 105 105 104  CO2 28 28 30   BUN 28* 46* 55*  CREATININE 1.04 1.43* 1.53*  CALCIUM 8.5 8.8 8.6  GLUCOSE 157* 146* 138*    CBG (last 3)   Recent Labs  11/29/14 1952  GLUCAP 144*    Scheduled Meds: . docusate sodium  100 mg Oral BID  . doxazosin  2 mg Oral Daily  . [START ON 12/02/2014] enoxaparin (LOVENOX) injection  90 mg Subcutaneous Q24H  . guaifenesin  200 mg Oral Daily  . ipratropium-albuterol  3 mL Nebulization TID  . methylPREDNISolone (SOLU-MEDROL) injection  60 mg Intravenous 3 times per day  . metoprolol  5 mg Intravenous Q12H  . pantoprazole (PROTONIX) IV  40 mg Intravenous Q12H  . piperacillin-tazobactam (ZOSYN)  IV  3.375 g Intravenous 3 times per day  . saccharomyces boulardii  250 mg Oral BID  . vancomycin  1,000 mg Intravenous Q24H    Continuous Infusions:   Past Medical History  Diagnosis Date  .  Hypertension   . Prostate cancer   . Spinal stenosis   . Peripheral neuropathy   . Diverticulosis   . Complete heart block     s/p PPM  . Vitamin D deficiency   . Paroxysmal atrial fibrillation   . Congestive heart failure, unspecified 03/05/2012  . Unspecified hypertrophic and atrophic condition of skin 01/16/2012  . Sebaceous cyst 01/16/2012  . Unspecified constipation 11/28/2011  . Other malaise and fatigue 01/28/2008  . Nonspecific abnormal electrocardiogram (ECG) (EKG) 01/28/2008  . Internal hemorrhoids without mention of complication 4/65/6812  . Unspecified hearing loss 02/19/2007  . Osteoarthrosis, unspecified whether generalized or localized, unspecified site 02/19/2007  . Abnormality of gait  06/04/1999  . Aortic valve disorders 06/04/1979  . Cerebellar atrophy 03/20/14  . Cerebrovascular disease 03/20/14    small vessell disease  . Urinary tract infection, site not specified 03/14/14  . Delirium 03/14/14  . Sepsis 03/14/14  . Anemia of chronic disease 2014  . Dysphagia 2015  . Presence of permanent cardiac pacemaker     Past Surgical History  Procedure Laterality Date  . Pacemaker insertion  01-04-2010    STJ 2210-[id number] dual chamber pacemaker implanted by Dr Rayann Heman for CHB  . Back surgery  2000    Spinal stenosis Dr. Achilles Dunk  . Nasal polyp excision    . Tonsillectomy    . Cataract extraction w/ intraocular lens  implant, bilateral  10/2009    Dr. Ellie Lunch  . Pacemaker placement  01/05/2010  . Colonoscopy  07/25/2007    int hem. diverticulosis  Dr. Joneen Roach, RD, [place], [place] Pager [phone] After Hours Pager [phone]

## 2014-12-02 DIAGNOSIS — R0600 Dyspnea, unspecified: Secondary | ICD-10-CM

## 2014-12-02 LAB — BASIC METABOLIC PANEL
Anion gap: 7 (ref 5–15)
BUN: 65 mg/dL — ABNORMAL HIGH (ref 6–23)
CO2: 32 mmol/L (ref 19–32)
Calcium: 8.7 mg/dL (ref 8.4–10.5)
Chloride: 107 mmol/L (ref 96–112)
Creatinine, Ser: 1.59 mg/dL — ABNORMAL HIGH (ref 0.50–1.35)
GFR, EST AFRICAN AMERICAN: 41 mL/min — AB (ref >90)
GFR, EST NON AFRICAN AMERICAN: 35 mL/min — AB (ref >90)
GLUCOSE: 162 mg/dL — AB (ref 70–99)
Potassium: 3.5 mmol/L (ref 3.5–5.1)
SODIUM: 146 mmol/L — AB (ref 135–145)

## 2014-12-02 LAB — CBC
HEMATOCRIT: 39.7 % (ref 39.0–52.0)
Hemoglobin: 12.7 g/dL — ABNORMAL LOW (ref 13.0–17.0)
MCH: 30.7 pg (ref 26.0–34.0)
MCHC: 32 g/dL (ref 30.0–36.0)
MCV: 95.9 fL (ref 78.0–100.0)
Platelets: 201 10*3/uL (ref 150–400)
RBC: 4.14 MIL/uL — ABNORMAL LOW (ref 4.22–5.81)
RDW: 13.7 % (ref 11.5–15.5)
WBC: 10.6 10*3/uL — ABNORMAL HIGH (ref 4.0–10.5)

## 2014-12-02 MED ORDER — BISACODYL 10 MG RE SUPP: 10.0000 mg | Freq: Every day | RECTAL | Status: DC | PRN

## 2014-12-02 MED ORDER — METHYLPREDNISOLONE SODIUM SUCC 125 MG IJ SOLR
60.0000 mg | Freq: Two times a day (BID) | INTRAMUSCULAR | Status: DC
  Administered 2014-12-02 – 2014-12-04 (×4): 60 mg via INTRAVENOUS
  Filled 2014-12-02 (×4): qty 0.96

## 2014-12-02 MED ORDER — METOPROLOL TARTRATE 1 MG/ML IV SOLN
5.0000 mg | Freq: Four times a day (QID) | INTRAVENOUS | Status: DC
  Administered 2014-12-02 – 2014-12-04 (×7): 5 mg via INTRAVENOUS
  Filled 2014-12-02 (×10): qty 5

## 2014-12-02 MED ORDER — POTASSIUM CL IN DEXTROSE 5% 20 MEQ/L IV SOLN
20.0000 meq | INTRAVENOUS | Status: DC
  Administered 2014-12-02: 20 meq via INTRAVENOUS
  Filled 2014-12-02 (×3): qty 1000

## 2014-12-02 NOTE — Progress Notes (Signed)
SLP Cancellation Note  Patient Details Name: Nicholas Hood MRN: [id number] DOB: 1916/01/01   Cancelled treatment:       Reason Eval/Treat Not Completed: Fatigue/lethargy/AMS limiting ability to participate.   Gabriel Rainwater MA, CCC-SLP [phone]    Elleah Hemsley Meryl 12/02/2014, 9:05 AM

## 2014-12-02 NOTE — Progress Notes (Addendum)
[facility] PROGRESS NOTE  Nicholas Hood WCH:[id number] DOB: 05/16/1916 DOA: 11/28/2014 PCP: Estill Dooms, MD  Assessment/Plan: 81 year old with CHF, Stroke, Paroxysmal A fib, complete heart Block S/P pacemaker who was admitted with acute respiratory Failure.  May be secondary to pneumonia, heart failure, but I am also concerned he may have had a stroke given his inability to communicate and cheyne stokes breathing.  Very guarded prognosis, particularly given his inability to eat and drink safely.  I spoke at length with the daughter and son-in-law from [place] regarding his prognosis.  They are going to discuss his care with the patient's HPOA and we will meet tomorrow at 8AM to discuss [place].  I have recommended comfort measures if he continues to show no signs of improvement by tomorrow.    Acute Hypoxic Respiratory Failure with Cheyne Stokes breathing Multifactorial, PNA, bronchospasm. HF IV solumedrol, continue with IV vanc and zosyn, nebulizer treatments.  Suctioning as possible.  IV dilaudid for increase work of breathing PRN.  Palliative Care following, help appreciated.  Off BiPAP but still with dyspnea.  Appears mildly dehydrated based on labs this AM Continue to hold lasix  Continue prn IV dilaudid  Acute on Chronic diastolic HF; Hold lasix, due to raising cr. Will repeat chest x ray.   Mildly elevated troponin. Probably demand ischemia. ECHO ordered.   A fib , metoprolol IV, Xarelto changed to Lovenox  Encephalopathy; non verbal. Suspect multifactorial. Secondary to infection, medication.  - hesitant to send for MRI brain or CT head given abnormal breathing pattern  Anxiety; was on chronic benzo.  - Continue with ativan PRN.   7-AKI; will hold lasix.   Hypernatremia, likely has free water deficit -  Change fluids to d5W at 94ml/h  8-HTN;  change scheduled metoprolol IV to q6h -  continue PRN hydralazine.   Code Status: DNR Family Communication: Care discussed  with daughter and son-in-law Disposition Plan: poor prognosis.    Consultants:  Palliative Care.   Procedures:  none  Antibiotics:  Vancomycin 2-7  Zosyn 2-7  HPI/Subjective: Patient attempts to cough at times. He appears alert but not responding to questions  Objective: Filed Vitals:   12/02/14 1128  BP:   Pulse:   Temp: 98 F (36.7 C)  Resp:     Intake/Output Summary (Last 24 hours) at 12/02/14 1253 Last data filed at 12/02/14 0729  Gross per 24 hour  Intake    350 ml  Output    153 ml  Net    197 ml   Filed Weights   11/30/14 0500 12/01/14 0500 12/02/14 0500  Weight: 92.3 kg (203 lb 7.8 oz) 89.5 kg (197 lb 5 oz) 89.9 kg (198 lb 3.1 oz)    Exam:   General: Patient with irregular breathing pattern. He is more alert.   Cardiovascular: S 1, S 2 RRR  Respiratory: decrease breath sound, no wheezing.   Abdomen: BS present, soft, NT  Musculoskeletal: trace bilateral pedal edema.   Neuro:  Does not move legs but moves arms spontaneously  Data Reviewed: Basic Metabolic Panel:  Recent Labs Lab 11/28/14 0458 11/29/14 0435 11/30/14 0948 12/01/14 0401 12/02/14 0030  NA 137 140 142 143 146*  K 4.0 3.9 3.9 3.6 3.5  CL 107 105 105 104 107  CO2 24 28 28 30  32  GLUCOSE 145* 157* 146* 138* 162*  BUN 20 28* 46* 55* 65*  CREATININE 0.90 1.04 1.43* 1.53* 1.59*  CALCIUM 8.5 8.5 8.8 8.6 8.7  Liver Function Tests:  Recent Labs Lab 11/26/14 11/28/14 0458  AST 9* 16  ALT 8* 8  ALKPHOS 54 58  BILITOT  --  0.7  PROT  --  7.5  ALBUMIN  --  2.6*   No results for input(s): LIPASE, AMYLASE in the last 168 hours. No results for input(s): AMMONIA in the last 168 hours. CBC:  Recent Labs Lab 11/28/14 0458 11/29/14 0435 11/30/14 0948 12/01/14 0401 12/02/14 0030  WBC 13.1* 11.9* 11.0* 9.4 10.6*  NEUTROABS 10.6*  --   --   --   --   HGB 11.7* 11.1* 12.5* 12.4* 12.7*  HCT 36.2* 33.9* 39.4 38.2* 39.7  MCV 94.3 97.1 96.1 94.1 95.9  PLT 185 190 207 221  201   Cardiac Enzymes:  Recent Labs Lab 11/28/14 0458 11/28/14 0857 11/28/14 1515 11/28/14 2034  TROPONINI 0.14* 0.12* 0.12* 0.13*   BNP (last 3 results)  Recent Labs  11/28/14 0458  BNP 362.6*    ProBNP (last 3 results) No results for input(s): PROBNP in the last 8760 hours.  CBG:  Recent Labs Lab 11/29/14 1952  GLUCAP 144*    Recent Results (from the past 240 hour(s))  Blood culture (routine x 2)     Status: None (Preliminary result)   Collection Time: 11/28/14  5:10 AM  Result Value Ref Range Status   Specimen Description BLOOD RIGHT ARM  Final   Special Requests BOTTLES DRAWN AEROBIC AND ANAEROBIC 10CC EACH  Final   Culture   Final           BLOOD CULTURE RECEIVED NO GROWTH TO DATE CULTURE WILL BE HELD FOR 5 DAYS BEFORE ISSUING A FINAL NEGATIVE REPORT Performed at [facility]    Report Status PENDING  Incomplete  Blood culture (routine x 2)     Status: None (Preliminary result)   Collection Time: 11/28/14  5:20 AM  Result Value Ref Range Status   Specimen Description BLOOD RIGHT HAND  Final   Special Requests BOTTLES DRAWN AEROBIC ONLY 10CC  Final   Culture   Final           BLOOD CULTURE RECEIVED NO GROWTH TO DATE CULTURE WILL BE HELD FOR 5 DAYS BEFORE ISSUING A FINAL NEGATIVE REPORT Performed at [facility]    Report Status PENDING  Incomplete  MRSA PCR Screening     Status: None   Collection Time: 11/28/14 10:20 AM  Result Value Ref Range Status   MRSA by PCR NEGATIVE NEGATIVE Final    Comment:        The GeneXpert MRSA Assay (FDA approved for NASAL specimens only), is one component of a comprehensive MRSA colonization surveillance program. It is not intended to diagnose MRSA infection nor to guide or monitor treatment for MRSA infections.      Studies: Dg Chest Port 1 View  12/01/2014   CLINICAL DATA:  81 year old male with sepsis, and dyspnea.  EXAM: PORTABLE CHEST - 1 VIEW  COMPARISON:  Prior chest x-ray 11/28/2014   FINDINGS: Stable cardiomegaly. Atherosclerotic calcifications present in the transverse aorta. Left subclavian approach cardiac rhythm maintenance device. Leads project over the right atrium and right ventricle. No pulmonary edema, pleural effusion or pneumothorax. Mild patchy bibasilar opacities which are nonspecific in appearance. Central bronchitic changes are similar compared to prior. No acute osseous abnormality.  IMPRESSION: 1. Nonspecific bibasilar opacities may reflect atelectasis or infiltrate. Dedicated PA and lateral chest x-ray may be helpful for further evaluation. 2. Stable cardiomegaly.  Electronically Signed   By: Jacqulynn Cadet M.D.   On: 12/01/2014 09:24    Scheduled Meds: . docusate sodium  100 mg Oral BID  . doxazosin  2 mg Oral Daily  . enoxaparin (LOVENOX) injection  90 mg Subcutaneous Q24H  . guaifenesin  200 mg Oral Daily  . ipratropium-albuterol  3 mL Nebulization TID  . methylPREDNISolone (SOLU-MEDROL) injection  60 mg Intravenous 3 times per day  . metoprolol  5 mg Intravenous Q12H  . pantoprazole (PROTONIX) IV  40 mg Intravenous Q12H  . piperacillin-tazobactam (ZOSYN)  IV  3.375 g Intravenous 3 times per day  . saccharomyces boulardii  250 mg Oral BID  . vancomycin  1,000 mg Intravenous Q24H   Continuous Infusions:   Principal Problem:   HCAP (healthcare-associated pneumonia) Active Problems:   Essential hypertension, benign   PROSTATE CANCER   Atrial fibrillation   Edema   SOB (shortness of breath)   Protein-calorie malnutrition   Acute respiratory failure   Palliative care encounter   Dyspnea    Time spent: 35 Minutes.     Janece Canterbury  [facility] Pager [phone]. If 7PM-7AM, please contact night-coverage at [email or web address], password [facility] 12/02/2014, 12:53 PM  LOS: 4 days

## 2014-12-02 NOTE — Progress Notes (Signed)
Pt noted to have unequal pupils, right pupil 5 mm and fixed, change from equal and reactive at beginning of shift. Paged provider, no new orders received. Will continue to monitor.

## 2014-12-02 NOTE — Progress Notes (Signed)
  Echocardiogram 2D Echocardiogram has been performed.  Nicholas Hood [name] 12/02/2014, 11:16 AM

## 2014-12-02 NOTE — Progress Notes (Signed)
Utilization review completed.  

## 2014-12-03 DIAGNOSIS — Z66 Do not resuscitate: Secondary | ICD-10-CM

## 2014-12-03 DIAGNOSIS — R1314 Dysphagia, pharyngoesophageal phase: Secondary | ICD-10-CM

## 2014-12-03 DIAGNOSIS — I5022 Chronic systolic (congestive) heart failure: Secondary | ICD-10-CM

## 2014-12-03 LAB — BASIC METABOLIC PANEL
ANION GAP: 9 (ref 5–15)
BUN: 67 mg/dL — AB (ref 6–23)
CHLORIDE: 111 mmol/L (ref 96–112)
CO2: 28 mmol/L (ref 19–32)
Calcium: 8.6 mg/dL (ref 8.4–10.5)
Creatinine, Ser: 1.43 mg/dL — ABNORMAL HIGH (ref 0.50–1.35)
GFR, EST AFRICAN AMERICAN: 46 mL/min — AB (ref >90)
GFR, EST NON AFRICAN AMERICAN: 40 mL/min — AB (ref >90)
Glucose, Bld: 159 mg/dL — ABNORMAL HIGH (ref 70–99)
Potassium: 3.5 mmol/L (ref 3.5–5.1)
SODIUM: 148 mmol/L — AB (ref 135–145)

## 2014-12-03 LAB — CBC
HCT: 41.8 % (ref 39.0–52.0)
Hemoglobin: 13.1 g/dL (ref 13.0–17.0)
MCH: 29.7 pg (ref 26.0–34.0)
MCHC: 31.3 g/dL (ref 30.0–36.0)
MCV: 94.8 fL (ref 78.0–100.0)
PLATELETS: 194 10*3/uL (ref 150–400)
RBC: 4.41 MIL/uL (ref 4.22–5.81)
RDW: 13.7 % (ref 11.5–15.5)
WBC: 11.2 10*3/uL — AB (ref 4.0–10.5)

## 2014-12-03 MED ORDER — POTASSIUM CL IN DEXTROSE 5% 20 MEQ/L IV SOLN
20.0000 meq | INTRAVENOUS | Status: DC
  Administered 2014-12-03: 20 meq via INTRAVENOUS
  Filled 2014-12-03 (×3): qty 1000

## 2014-12-03 NOTE — Progress Notes (Signed)
SLP Cancellation Note  Patient Details Name: Nicholas Hood MRN: [id number] DOB: Jul 08, 1916   Cancelled treatment:       Reason Eval/Treat Not Completed: Medical issues which prohibited therapy (Patient not alert enough for pos. )  Gabriel Rainwater [place], CCC-SLP [phone]  Nicholas Hood 12/03/2014, 8:29 AM

## 2014-12-03 NOTE — Progress Notes (Signed)
Patient to transfer to 6N25 report given to receiving nurse Lovena Le all questions answered at this time.  VSS with no s/s of distress noted.  Pt. Appears to be resting comfortably at this time.

## 2014-12-03 NOTE — Clinical Social Work Note (Signed)
CSW consulted for possible Inpatient Hospice versus [facility] with hospice services following. CSW contacted [facility] to request admissions liaison to contact patient's son, Tommie Raymond, regarding patient's questions about hospice services within [facility]. [facility] admission liaison to contact patient's son.  Per RNCM, patient's family to meet with Palliative Care on 12/03/2014 afternoon to discuss discharge disposition. CSW to continue to follow and assist with discharge planning needs.  Lubertha Sayres, [place] [phone]) Licensed Clinical Social Worker Orthopedics [phone]) and Surgical [phone])

## 2014-12-03 NOTE — Progress Notes (Signed)
[facility] PROGRESS NOTE  Nicholas Hood KGU:[id number] DOB: November 09, 1915 DOA: 11/28/2014 PCP: Estill Dooms, MD  Assessment/Plan: 81 year old with CHF, Stroke, Paroxysmal A fib, complete heart Block S/P pacemaker who was admitted with acute respiratory Failure.  May be secondary to pneumonia, heart failure, but I am also concerned he may have had a stroke given his inability to communicate and cheyne stokes breathing.  Very guarded prognosis, particularly given his inability to eat and drink safely.  I spoke at length with the daughter and son-in-law from [place] regarding his prognosis.  They are going to discuss his care with the patient's HPOA and we will meet tomorrow at 8AM to discuss [place].  Family leaning towards hospice care, likely inpatient hospice, but also would like to find out how much hospice care can be provided at [facility].  Planning to meet with palliative care later this morning.  Will place SW and CM consults  Acute Hypoxic Respiratory Failure with Cheyne Stokes breathing.  multifactorial, PNA, bronchospasm. HF IV solumedrol, continue with IV vanc and zosyn, nebulizer treatments.  Suctioning as possible.  IV dilaudid for increase work of breathing PRN.  Palliative Care following, help appreciated.  Again appears mildly dehydrated based on labs this AM Continue to hold lasix  Continue prn IV dilaudid  Acute on Chronic diastolic HF; ECHO demonstrates severe left ventricular dysfunction with EF of 30-35%, mod AS, more TR -  Hold lasix -  Gentle hydration -  Consider addition of ACEI  Mildly elevated troponin. Probably demand ischemia/AKI  A fib , metoprolol IV, Xarelto changed to Lovenox  Encephalopathy; considerably less verbal over last few days. Suspect multifactorial. Secondary to infection, medication.  - Hesitant to send for MRI brain - consider CT head if family wants to pursue slightly more aggressive evaluation  Anxiety; was on chronic benzo.  -  Continue with ativan PRN.   7-AKI; resolving slightly - will hold lasix -  Urine output increasing  Hypernatremia, free water deficit -  Continue d5W at 78ml/h >> did not run a large portion of the time yesterday because of antibiotics, but RN today is going to Y-in the fluids.    8-HTN;  change scheduled metoprolol IV to q6h -  continue PRN hydralazine.   Code Status: DNR Family Communication: Care discussed with daughter and son-in-law, son and daughter-in-law Disposition Plan: poor prognosis, recommend [facility] with palliative or inpatient hospice   Consultants:  Palliative Care.   Procedures:  none  Antibiotics:  Vancomycin 2-7  Zosyn 2-7  HPI/Subjective: Patient attempts to cough at times. He appears alert but not responding to questions  Objective: Filed Vitals:   12/03/14 0804  BP: 163/102  Pulse: 70  Temp:   Resp: 13    Intake/Output Summary (Last 24 hours) at 12/03/14 0833 Last data filed at 12/03/14 0700  Gross per 24 hour  Intake 1471.25 ml  Output    475 ml  Net 996.25 ml   Filed Weights   12/01/14 0500 12/02/14 0500 12/03/14 0415  Weight: 89.5 kg (197 lb 5 oz) 89.9 kg (198 lb 3.1 oz) 89.3 kg (196 lb 13.9 oz)    Exam:   General: Patient with irregular breathing pattern. He was sleepy this morning, but became more alert.   Cardiovascular: S 1, S 2 RRR  Respiratory: decrease breath sound, no wheezing.   Abdomen: BS present, soft, NT  Musculoskeletal: trace bilateral pedal edema.   Neuro:  Does not move legs but moves arms  spontaneously  Data Reviewed: Basic Metabolic Panel:  Recent Labs Lab 11/29/14 0435 11/30/14 0948 12/01/14 0401 12/02/14 0030 12/03/14 0327  NA 140 142 143 146* 148*  K 3.9 3.9 3.6 3.5 3.5  CL 105 105 104 107 111  CO2 28 28 30  32 28  GLUCOSE 157* 146* 138* 162* 159*  BUN 28* 46* 55* 65* 67*  CREATININE 1.04 1.43* 1.53* 1.59* 1.43*  CALCIUM 8.5 8.8 8.6 8.7 8.6   Liver Function Tests:  Recent  Labs Lab 11/28/14 0458  AST 16  ALT 8  ALKPHOS 58  BILITOT 0.7  PROT 7.5  ALBUMIN 2.6*   No results for input(s): LIPASE, AMYLASE in the last 168 hours. No results for input(s): AMMONIA in the last 168 hours. CBC:  Recent Labs Lab 11/28/14 0458 11/29/14 0435 11/30/14 0948 12/01/14 0401 12/02/14 0030 12/03/14 0327  WBC 13.1* 11.9* 11.0* 9.4 10.6* 11.2*  NEUTROABS 10.6*  --   --   --   --   --   HGB 11.7* 11.1* 12.5* 12.4* 12.7* 13.1  HCT 36.2* 33.9* 39.4 38.2* 39.7 41.8  MCV 94.3 97.1 96.1 94.1 95.9 94.8  PLT 185 190 207 221 201 194   Cardiac Enzymes:  Recent Labs Lab 11/28/14 0458 11/28/14 0857 11/28/14 1515 11/28/14 2034  TROPONINI 0.14* 0.12* 0.12* 0.13*   BNP (last 3 results)  Recent Labs  11/28/14 0458  BNP 362.6*    ProBNP (last 3 results) No results for input(s): PROBNP in the last 8760 hours.  CBG:  Recent Labs Lab 11/29/14 1952  GLUCAP 144*    Recent Results (from the past 240 hour(s))  Blood culture (routine x 2)     Status: None (Preliminary result)   Collection Time: 11/28/14  5:10 AM  Result Value Ref Range Status   Specimen Description BLOOD RIGHT ARM  Final   Special Requests BOTTLES DRAWN AEROBIC AND ANAEROBIC 10CC EACH  Final   Culture   Final           BLOOD CULTURE RECEIVED NO GROWTH TO DATE CULTURE WILL BE HELD FOR 5 DAYS BEFORE ISSUING A FINAL NEGATIVE REPORT Performed at [facility]    Report Status PENDING  Incomplete  Blood culture (routine x 2)     Status: None (Preliminary result)   Collection Time: 11/28/14  5:20 AM  Result Value Ref Range Status   Specimen Description BLOOD RIGHT HAND  Final   Special Requests BOTTLES DRAWN AEROBIC ONLY 10CC  Final   Culture   Final           BLOOD CULTURE RECEIVED NO GROWTH TO DATE CULTURE WILL BE HELD FOR 5 DAYS BEFORE ISSUING A FINAL NEGATIVE REPORT Performed at [facility]    Report Status PENDING  Incomplete  MRSA PCR Screening     Status: None    Collection Time: 11/28/14 10:20 AM  Result Value Ref Range Status   MRSA by PCR NEGATIVE NEGATIVE Final    Comment:        The GeneXpert MRSA Assay (FDA approved for NASAL specimens only), is one component of a comprehensive MRSA colonization surveillance program. It is not intended to diagnose MRSA infection nor to guide or monitor treatment for MRSA infections.      Studies: Dg Chest Port 1 View  12/01/2014   CLINICAL DATA:  81 year old male with sepsis, and dyspnea.  EXAM: PORTABLE CHEST - 1 VIEW  COMPARISON:  Prior chest x-ray 11/28/2014  FINDINGS: Stable cardiomegaly. Atherosclerotic calcifications  present in the transverse aorta. Left subclavian approach cardiac rhythm maintenance device. Leads project over the right atrium and right ventricle. No pulmonary edema, pleural effusion or pneumothorax. Mild patchy bibasilar opacities which are nonspecific in appearance. Central bronchitic changes are similar compared to prior. No acute osseous abnormality.  IMPRESSION: 1. Nonspecific bibasilar opacities may reflect atelectasis or infiltrate. Dedicated PA and lateral chest x-ray may be helpful for further evaluation. 2. Stable cardiomegaly.   Electronically Signed   By: Jacqulynn Cadet M.D.   On: 12/01/2014 09:24    Scheduled Meds: . enoxaparin (LOVENOX) injection  90 mg Subcutaneous Q24H  . ipratropium-albuterol  3 mL Nebulization TID  . methylPREDNISolone (SOLU-MEDROL) injection  60 mg Intravenous Q12H  . metoprolol  5 mg Intravenous 4 times per day  . pantoprazole (PROTONIX) IV  40 mg Intravenous Q12H  . piperacillin-tazobactam (ZOSYN)  IV  3.375 g Intravenous 3 times per day  . vancomycin  1,000 mg Intravenous Q24H   Continuous Infusions: . dextrose 5 % with KCl 20 mEq / L 20 mEq (12/02/14 1523)    Principal Problem:   HCAP (healthcare-associated pneumonia) Active Problems:   Essential hypertension, benign   PROSTATE CANCER   Atrial fibrillation   Edema   SOB (shortness  of breath)   Protein-calorie malnutrition   Acute respiratory failure   Palliative care encounter   Dyspnea    Time spent: 35 Minutes.     Janece Canterbury  [facility] Pager [phone]. If 7PM-7AM, please contact night-coverage at [email or web address], password [facility] 12/03/2014, 8:33 AM  LOS: 5 days

## 2014-12-03 NOTE — Progress Notes (Signed)
Progress Note from the Palliative Medicine Team at [facility] and Plan:  -Patient is minimally responsive to gentle touch and verbal stimuli, appears comfortable, continues to fail to thrive in presence of medical interventions.   -met with family at bedside to continue discussion regarding diagnosis, prognosis, continuation of medical interventions within the context of his [place], natural trajectory at EOL, disposition and options.  We discussed the limitations of medical interventions within the  context of mortality.   -Questions and concerns addressed  -they plan  to continue to process and discuss continuation of current medical interventions and disposition options as a family,  and make a definitive decisions by morning, this NP will re meet with family at 0830 am  -son [name] and his wife Debbie/daughter Arbie Cookey and her husband Zenia Resides present for above discussion     Objective: Allergies  Allergen Reactions  . Accupril [Quinapril Hcl]     Per MAR  . Alacepril     Per MAR  . Altace [Ramipril]     Unknown reaction   . Hctz [Hydrochlorothiazide]     Unknown reaction  . Inderal [Propranolol]     Per MAR  . Mobic [Meloxicam]     Per MAR  . Monopril [Fosinopril]     Per MAR  . Quinapril Hcl     Unknown reaction  . Reserpine     Per MAR   Scheduled Meds: . enoxaparin (LOVENOX) injection  90 mg Subcutaneous Q24H  . ipratropium-albuterol  3 mL Nebulization TID  . methylPREDNISolone (SOLU-MEDROL) injection  60 mg Intravenous Q12H  . metoprolol  5 mg Intravenous 4 times per day  . pantoprazole (PROTONIX) IV  40 mg Intravenous Q12H  . piperacillin-tazobactam (ZOSYN)  IV  3.375 g Intravenous 3 times per day  . vancomycin  1,000 mg Intravenous Q24H   Continuous Infusions: . dextrose 5 % with KCl 20 mEq / L 20 mEq (12/03/14 0842)   PRN Meds:.albuterol, bisacodyl, hydrALAZINE, HYDROmorphone (DILAUDID) injection, LORazepam, ondansetron **OR** ondansetron (ZOFRAN)  IV  BP 158/93 mmHg  Pulse 69  Temp(Src) 97.6 F (36.4 C) (Oral)  Resp 13  Ht 5' 7"  (1.702 m)  Wt 89.3 kg (196 lb 13.9 oz)  BMI 30.83 kg/m2  SpO2 98%   PPS:20 % at best    Intake/Output Summary (Last 24 hours) at 12/03/14 1433 Last data filed at 12/03/14 1137  Gross per 24 hour  Intake 1571.25 ml  Output    475 ml  Net 1096.25 ml       Physical Exam:  General: minimally responsive, NAD Chest:  Decreased in bases, scattered coarse BS CVS: RRR Abdomen:  Soft,  decreased BS   Labs: CBC    Component Value Date/Time   WBC 11.2* 12/03/2014 0327   WBC 10.9 11/26/2014   RBC 4.41 12/03/2014 0327   HGB 13.1 12/03/2014 0327   HCT 41.8 12/03/2014 0327   PLT 194 12/03/2014 0327   MCV 94.8 12/03/2014 0327   MCH 29.7 12/03/2014 0327   MCHC 31.3 12/03/2014 0327   RDW 13.7 12/03/2014 0327   LYMPHSABS 1.2 11/28/2014 0458   MONOABS 1.2* 11/28/2014 0458   EOSABS 0.1 11/28/2014 0458   BASOSABS 0.0 11/28/2014 0458    BMET    Component Value Date/Time   NA 148* 12/03/2014 0327   NA 139 11/26/2014   K 3.5 12/03/2014 0327   CL 111 12/03/2014 0327   CO2 28 12/03/2014 0327   GLUCOSE 159* 12/03/2014 0327   BUN  67* 12/03/2014 0327   BUN 22* 11/26/2014   CREATININE 1.43* 12/03/2014 0327   CREATININE 0.8 11/26/2014   CALCIUM 8.6 12/03/2014 0327   GFRNONAA 40* 12/03/2014 0327   GFRAA 46* 12/03/2014 0327    CMP     Component Value Date/Time   NA 148* 12/03/2014 0327   NA 139 11/26/2014   K 3.5 12/03/2014 0327   CL 111 12/03/2014 0327   CO2 28 12/03/2014 0327   GLUCOSE 159* 12/03/2014 0327   BUN 67* 12/03/2014 0327   BUN 22* 11/26/2014   CREATININE 1.43* 12/03/2014 0327   CREATININE 0.8 11/26/2014   CALCIUM 8.6 12/03/2014 0327   PROT 7.5 11/28/2014 0458   ALBUMIN 2.6* 11/28/2014 0458   AST 16 11/28/2014 0458   ALT 8 11/28/2014 0458   ALKPHOS 58 11/28/2014 0458   BILITOT 0.7 11/28/2014 0458   GFRNONAA 40* 12/03/2014 0327   GFRAA 46* 12/03/2014 0327    Patient  Documents Completed or Given: Document Given Completed  Advanced Directives Pkt    MOST X   DNR    Gone from My Sight    Hard Choices X     Time In Time Out Total Time Spent with Patient Total Overall Time  0815 0915 60 min 60 min    Greater than 50%  of this time was spent counseling and coordinating care related to the above assessment and plan.  Wadie Lessen NP  Palliative Medicine Team Team Phone # [phone] Pager [phone]  Discussed with Dr Sheran Fava 1

## 2014-12-04 DIAGNOSIS — I639 Cerebral infarction, unspecified: Secondary | ICD-10-CM

## 2014-12-04 DIAGNOSIS — R1314 Dysphagia, pharyngoesophageal phase: Secondary | ICD-10-CM

## 2014-12-04 DIAGNOSIS — I5022 Chronic systolic (congestive) heart failure: Secondary | ICD-10-CM

## 2014-12-04 DIAGNOSIS — I6350 Cerebral infarction due to unspecified occlusion or stenosis of unspecified cerebral artery: Secondary | ICD-10-CM

## 2014-12-04 LAB — CBC
HEMATOCRIT: 46.3 % (ref 39.0–52.0)
Hemoglobin: 14.6 g/dL (ref 13.0–17.0)
MCH: 30.4 pg (ref 26.0–34.0)
MCHC: 31.5 g/dL (ref 30.0–36.0)
MCV: 96.5 fL (ref 78.0–100.0)
Platelets: 193 10*3/uL (ref 150–400)
RBC: 4.8 MIL/uL (ref 4.22–5.81)
RDW: 13.9 % (ref 11.5–15.5)
WBC: 14.6 10*3/uL — ABNORMAL HIGH (ref 4.0–10.5)

## 2014-12-04 LAB — CULTURE, BLOOD (ROUTINE X 2)
CULTURE: NO GROWTH
Culture: NO GROWTH

## 2014-12-04 LAB — BASIC METABOLIC PANEL
ANION GAP: 9 (ref 5–15)
BUN: 60 mg/dL — ABNORMAL HIGH (ref 6–23)
CO2: 30 mmol/L (ref 19–32)
CREATININE: 1.39 mg/dL — AB (ref 0.50–1.35)
Calcium: 8.7 mg/dL (ref 8.4–10.5)
Chloride: 110 mmol/L (ref 96–112)
GFR calc Af Amer: 48 mL/min — ABNORMAL LOW (ref >90)
GFR calc non Af Amer: 41 mL/min — ABNORMAL LOW (ref >90)
Glucose, Bld: 130 mg/dL — ABNORMAL HIGH (ref 70–99)
Potassium: 3.2 mmol/L — ABNORMAL LOW (ref 3.5–5.1)
Sodium: 149 mmol/L — ABNORMAL HIGH (ref 135–145)

## 2014-12-04 MED ORDER — MORPHINE SULFATE (CONCENTRATE) 10 MG/0.5ML PO SOLN
5.0000 mg | ORAL | Status: DC | PRN
  Administered 2014-12-04: 5 mg via ORAL
  Filled 2014-12-04: qty 0.5

## 2014-12-04 MED ORDER — MORPHINE SULFATE (CONCENTRATE) 10 MG/0.5ML PO SOLN: 5.0000 mg | ORAL | Status: AC | PRN

## 2014-12-04 MED ORDER — ALBUTEROL SULFATE (2.5 MG/3ML) 0.083% IN NEBU: 2.5000 mg | INHALATION_SOLUTION | RESPIRATORY_TRACT | Status: AC | PRN

## 2014-12-04 MED ORDER — MORPHINE SULFATE (CONCENTRATE) 10 MG/0.5ML PO SOLN: 5.0000 mg | ORAL | Status: DC | PRN

## 2014-12-04 MED ORDER — BISACODYL 10 MG RE SUPP: 10.0000 mg | Freq: Every day | RECTAL | Status: AC | PRN

## 2014-12-04 MED ORDER — POTASSIUM CL IN DEXTROSE 5% 20 MEQ/L IV SOLN: 20.0000 meq | INTRAVENOUS | Status: DC

## 2014-12-04 MED ORDER — LORAZEPAM 1 MG PO TABS: 1.0000 mg | ORAL_TABLET | Freq: Four times a day (QID) | ORAL | Status: DC | PRN

## 2014-12-04 MED ORDER — LORAZEPAM 1 MG PO TABS: 1.0000 mg | ORAL_TABLET | ORAL | Status: DC | PRN

## 2014-12-04 MED ORDER — LORAZEPAM 1 MG PO TABS: 1.0000 mg | ORAL_TABLET | ORAL | Status: AC | PRN

## 2014-12-04 NOTE — Discharge Summary (Signed)
Physician Discharge Summary  Nicholas Hood UXN:[id number] DOB: September 22, 1916 DOA: 11/28/2014  PCP: Estill Dooms, MD  Admit date: 11/28/2014 Discharge date: 12/04/2014  Recommendations for Outpatient Follow-up:  1. Transfer to [place] for hospice care 2. MOST form filled out again with palliative care/DNR already complete  Discharge Diagnoses:  Principal Problem:   HCAP (healthcare-associated pneumonia) Active Problems:   Essential hypertension, benign   PROSTATE CANCER   Atrial fibrillation   Edema   SOB (shortness of breath)   Protein-calorie malnutrition   Acute respiratory failure   Palliative care encounter   Dyspnea   Dysphagia, pharyngoesophageal phase   Chronic systolic congestive heart failure   DNR (do not resuscitate)   Discharge Condition: poor  Diet recommendation: regular diet for comfort only if able to sit up fully awake and follow instructions to eat.  Has not indicated desire to eat.  Wt Readings from Last 3 Encounters:  12/03/14 89.3 kg (196 lb 13.9 oz)  04/06/14 100.699 kg (222 lb)  09/09/13 93.441 kg (206 lb)    History of present illness:   81 year old male with history of dementia, largely bedbound, hypertension, paroxysmal atrial fibrillation on Xarelto, bilateral hearing loss, chronic diastolic congestive heart failure with ejection fraction of 60%, possible stroke, he was brought into the hospital after a mechanical fall after which she was noted to be very [place]on [place] of breath. In the emergency department, he was placed on CPAP. He had a mild leukocytosis and it was felt that he had pneumonia.  He was admitted and started on broad-spectrum antibiotics. Although initially, he was able to communicate in his normal way, quickly he lost the ability to communicate normally. He has been essentially nonverbal for several days. It was felt that he likely had a stroke causing him to have some aphasia as well as Cheyne-Stokes breathing.  He developed dysphasia and  was high risk for aspiration. Additionally, echocardiogram demonstrated severe congestive heart failure. He continued treatment for heart failure with fluid restriction and antibiotics for pneumonia and work repeatedly with therapy on his swallowing in his physical activity with progressive decline. Given his comorbidities and his progressive decline, the family that with palliative care to discuss goals of care.  They elected for comfort measures and asked that he be transferred back to [facility] with hospice care.  Hospital Course:   Acute Hypoxic Respiratory Failure with Cheyne Stokes breathing. multifactorial possible due to PNA, bronchospasm, acute on chronic congestive heart failure, and/or stroke He was started on IV solumedrol, IV vanc and zosyn, nebulizer treatments with progressive worsening of his cheyne-stokes breathing.    Given IV dilaudid as needed during hospitalization and will rx roxanol for dyspnea at discharge  Acute on Chronic diastolic HF; ECHO demonstrates severe left ventricular dysfunction with EF of 30-35%, mod AS, more TR.  He developed signs of dehydration after fluid restriction and lasix.  He was hydrated and lasix were held.  Comfort measures only at this time.    Mildly elevated troponin. Probably demand ischemia/AKI.    A fib , metoprolol IV, Xarelto changed to Lovenox.  Hold medications at discharge.  Medications only for comfort  Encephalopathy; considerably less verbal over last few days. Suspect multifactorial. Secondary to infection, medication and likely stroke.  - Hesitant to send for MRI brain given breathing pattern - family decided against further evaluation in favor of avoiding discomfort  Anxiety; was on chronic benzo.  - Continue with ativan PRN.   7-AKI; resolving slightly with hydration -  No further labs  Hypernatremia, free water deficit.  Addressed with D5W.    8-HTN; Given scheduled metoprolol IV to  q6h    Consultants:  Palliative Care.  Procedures:  none  Antibiotics:  Vancomycin 2-7 > 2/12  Zosyn 2-7 > 2/12  Discharge Exam: Filed Vitals:   12/04/14 0755  BP:   Pulse: 69  Temp:   Resp: 25   Filed Vitals:   12/03/14 1325 12/04/14 0437 12/04/14 0755 12/04/14 1100  BP:  162/88    Pulse:  78 69   Temp:  97.1 F (36.2 C)    TempSrc:  Axillary    Resp:  24 25   Height:      Weight:      SpO2: 98% 97% 98% 97%     General: Patient with irregular breathing pattern. Alert but nonverbal.  Long pauses in breathing  Cardiovascular: S 1, S 2 RRR  Respiratory: decrease breath sound, no wheezing.   Abdomen: BS present, soft, NT  Musculoskeletal: trace bilateral pedal edema.   Neuro: Does not move legs but moves arms spontaneously  Discharge Instructions      Discharge Instructions    Bed rest    Complete by:  As directed      Call MD for:  difficulty breathing, headache or visual disturbances    Complete by:  As directed      Call MD for:  persistant nausea and vomiting    Complete by:  As directed      Call MD for:  severe uncontrolled pain    Complete by:  As directed      Diet general    Complete by:  As directed             Medication List    STOP taking these medications        acetaminophen 325 MG tablet  Commonly known as:  TYLENOL     benzonatate 200 MG capsule  Commonly known as:  TESSALON     cholecalciferol 1000 UNITS tablet  Commonly known as:  VITAMIN D     doxazosin 2 MG tablet  Commonly known as:  CARDURA     DSS 100 MG Caps     Glucosamine-Chondroitin 500-400 MG Caps     levofloxacin 750 MG tablet  Commonly known as:  LEVAQUIN     losartan 50 MG tablet  Commonly known as:  COZAAR     metoprolol tartrate 25 MG tablet  Commonly known as:  LOPRESSOR     omeprazole 20 MG capsule  Commonly known as:  PRILOSEC     OYSTER SHELL CALCIUM + D 500-400 MG-UNIT Tabs  Generic drug:  Calcium Carb-Cholecalciferol      Rivaroxaban 15 MG Tabs tablet  Commonly known as:  XARELTO     saccharomyces boulardii 250 MG capsule  Commonly known as:  FLORASTOR     ZYRTEC ALLERGY 10 MG tablet  Generic drug:  cetirizine      TAKE these medications        albuterol (2.5 MG/3ML) 0.083% nebulizer solution  Commonly known as:  PROVENTIL  Take 3 mLs (2.5 mg total) by nebulization every 2 (two) hours as needed for wheezing or shortness of breath.     bisacodyl 10 MG suppository  Commonly known as:  DULCOLAX  Place 1 suppository (10 mg total) rectally daily as needed for mild constipation or moderate constipation.     LORazepam 1 MG tablet  Commonly known as:  ATIVAN  Take 1 tablet (1 mg total) by mouth every 4 (four) hours as needed for anxiety.     morphine CONCENTRATE 10 MG/0.5ML Soln concentrated solution  Take 0.25 mLs (5 mg total) by mouth every hour as needed for moderate pain, severe pain or shortness of breath.          The results of significant diagnostics from this hospitalization (including imaging, microbiology, ancillary and laboratory) are listed below for reference.    Significant Diagnostic Studies: Dg Chest Port 1 View  12/01/2014   CLINICAL DATA:  81 year old male with sepsis, and dyspnea.  EXAM: PORTABLE CHEST - 1 VIEW  COMPARISON:  Prior chest x-ray 11/28/2014  FINDINGS: Stable cardiomegaly. Atherosclerotic calcifications present in the transverse aorta. Left subclavian approach cardiac rhythm maintenance device. Leads project over the right atrium and right ventricle. No pulmonary edema, pleural effusion or pneumothorax. Mild patchy bibasilar opacities which are nonspecific in appearance. Central bronchitic changes are similar compared to prior. No acute osseous abnormality.  IMPRESSION: 1. Nonspecific bibasilar opacities may reflect atelectasis or infiltrate. Dedicated PA and lateral chest x-ray may be helpful for further evaluation. 2. Stable cardiomegaly.   Electronically Signed   By: Jacqulynn Cadet M.D.   On: 12/01/2014 09:24   Dg Chest Port 1 View  11/28/2014   CLINICAL DATA:  82 year old male with shortness of breath.  EXAM: PORTABLE CHEST - 1 VIEW  COMPARISON:  03/17/2014  FINDINGS: Dual lead left-sided pacemaker remains in place. Cardiomegaly and tortuous thoracic aorta, unchanged from prior exam. Pulmonary vasculature is normal. No confluent airspace disease. Scattered calcified granuloma are unchanged from prior exam. There is no pleural effusion or pneumothorax. No acute osseous abnormality.  IMPRESSION: No acute pulmonary process.   Electronically Signed   By: Jeb Levering M.D.   On: 11/28/2014 05:37    Microbiology: Recent Results (from the past 240 hour(s))  Blood culture (routine x 2)     Status: None   Collection Time: 11/28/14  5:10 AM  Result Value Ref Range Status   Specimen Description BLOOD RIGHT ARM  Final   Special Requests BOTTLES DRAWN AEROBIC AND ANAEROBIC 10CC EACH  Final   Culture   Final    NO GROWTH 5 DAYS Note: Culture results may be compromised due to an excessive volume of blood received in culture bottles. Performed at [facility]    Report Status 12/04/2014 FINAL  Final  Blood culture (routine x 2)     Status: None   Collection Time: 11/28/14  5:20 AM  Result Value Ref Range Status   Specimen Description BLOOD RIGHT HAND  Final   Special Requests BOTTLES DRAWN AEROBIC ONLY 10CC  Final   Culture   Final    NO GROWTH 5 DAYS Note: Culture results may be compromised due to an excessive volume of blood received in culture bottles. Performed at [facility]    Report Status 12/04/2014 FINAL  Final  MRSA PCR Screening     Status: None   Collection Time: 11/28/14 10:20 AM  Result Value Ref Range Status   MRSA by PCR NEGATIVE NEGATIVE Final    Comment:        The GeneXpert MRSA Assay (FDA approved for NASAL specimens only), is one component of a comprehensive MRSA colonization surveillance program. It is not intended  to diagnose MRSA infection nor to guide or monitor treatment for MRSA infections.      Labs: Basic Metabolic Panel:  Recent Labs Lab 11/30/14 [phone] 12/01/14  0401 12/02/14 0030 12/03/14 0327 12/04/14 0726  NA 142 143 146* 148* 149*  K 3.9 3.6 3.5 3.5 3.2*  CL 105 104 107 111 110  CO2 28 30 32 28 30  GLUCOSE 146* 138* 162* 159* 130*  BUN 46* 55* 65* 67* 60*  CREATININE 1.43* 1.53* 1.59* 1.43* 1.39*  CALCIUM 8.8 8.6 8.7 8.6 8.7   Liver Function Tests:  Recent Labs Lab 11/28/14 0458  AST 16  ALT 8  ALKPHOS 58  BILITOT 0.7  PROT 7.5  ALBUMIN 2.6*   No results for input(s): LIPASE, AMYLASE in the last 168 hours. No results for input(s): AMMONIA in the last 168 hours. CBC:  Recent Labs Lab 11/28/14 0458  11/30/14 0948 12/01/14 0401 12/02/14 0030 12/03/14 0327 12/04/14 0726  WBC 13.1*  < > 11.0* 9.4 10.6* 11.2* 14.6*  NEUTROABS 10.6*  --   --   --   --   --   --   HGB 11.7*  < > 12.5* 12.4* 12.7* 13.1 14.6  HCT 36.2*  < > 39.4 38.2* 39.7 41.8 46.3  MCV 94.3  < > 96.1 94.1 95.9 94.8 96.5  PLT 185  < > 207 221 201 194 193  < > = values in this interval not displayed. Cardiac Enzymes:  Recent Labs Lab 11/28/14 0458 11/28/14 0857 11/28/14 1515 11/28/14 2034  TROPONINI 0.14* 0.12* 0.12* 0.13*   BNP: BNP (last 3 results)  Recent Labs  11/28/14 0458  BNP 362.6*    ProBNP (last 3 results) No results for input(s): PROBNP in the last 8760 hours.  CBG:  Recent Labs Lab 11/29/14 1952  GLUCAP 144*    Time coordinating discharge: 35 minutes  Signed:  Dequavius Kuhner  [facility] 12/04/2014, 12:33 PM

## 2014-12-04 NOTE — Clinical Social Work Note (Signed)
Patient to be discharged back to [facility] with hospice services following. Patient's son, Tommie Raymond, updated regarding discharge.  Facility: [facility] Report number: [phone] Transportation: EMS ([place])  Lubertha Sayres, [place] ([phone]) Licensed Clinical Social Worker Orthopedics [phone]) and Surgical [phone])

## 2014-12-04 NOTE — Progress Notes (Signed)
Progress Note from the Palliative Medicine Team at [place] and Plan:  -Patient is minimally responsive to gentle touch and verbal stimuli, appears comfortable, continues to fail to thrive in presence of medical interventions.   -re-meet with family at bedside to continue discussion regarding diagnosis, prognosis, continuation of medical interventions within the context of his [place], natural trajectory at EOL, disposition and options.    -Questions and concerns addressed  --son [name] and daughter Arbie Cookey and her husband Zenia Resides present for above discussion  -plan is for comfort and dignity focus of care and [facility], family anticipated dc today  Symptom management   Dyspnea/Pain: Roxanol 5 mg po/sl every 1 hr prn Agitation: Ativan 1 mg po/sl every 6 hrs prn     Objective: Allergies  Allergen Reactions  . Accupril [Quinapril Hcl]     Per MAR  . Alacepril     Per MAR  . Altace [Ramipril]     Unknown reaction   . Hctz [Hydrochlorothiazide]     Unknown reaction  . Inderal [Propranolol]     Per MAR  . Mobic [Meloxicam]     Per MAR  . Monopril [Fosinopril]     Per MAR  . Quinapril Hcl     Unknown reaction  . Reserpine     Per MAR   Scheduled Meds: . ipratropium-albuterol  3 mL Nebulization TID  . pantoprazole (PROTONIX) IV  40 mg Intravenous Q12H   Continuous Infusions:   PRN Meds:.albuterol, bisacodyl, LORazepam, morphine CONCENTRATE, ondansetron **OR** ondansetron (ZOFRAN) IV  BP 162/88 mmHg  Pulse 78  Temp(Src) 97.1 F (36.2 C) (Axillary)  Resp 24  Ht 5\' 7"  (1.702 m)  Wt 89.3 kg (196 lb 13.9 oz)  BMI 30.83 kg/m2  SpO2 97%   PPS:20 % at best    Intake/Output Summary (Last 24 hours) at 12/04/14 0908 Last data filed at 12/04/14 0300  Gross per 24 hour  Intake    100 ml  Output    250 ml  Net   -150 ml       Physical Exam:  General: minimally responsive, NAD Chest:  Decreased in bases,  scattered coarse BS CVS: RRR Skin: warm and dry Abdomen:  Soft,  decreased BS   Labs: CBC    Component Value Date/Time   WBC 14.6* 12/04/2014 0726   WBC 10.9 11/26/2014   RBC 4.80 12/04/2014 0726   HGB 14.6 12/04/2014 0726   HCT 46.3 12/04/2014 0726   PLT 193 12/04/2014 0726   MCV 96.5 12/04/2014 0726   MCH 30.4 12/04/2014 0726   MCHC 31.5 12/04/2014 0726   RDW 13.9 12/04/2014 0726   LYMPHSABS 1.2 11/28/2014 0458   MONOABS 1.2* 11/28/2014 0458   EOSABS 0.1 11/28/2014 0458   BASOSABS 0.0 11/28/2014 0458    BMET    Component Value Date/Time   NA 149* 12/04/2014 0726   NA 139 11/26/2014   K 3.2* 12/04/2014 0726   CL 110 12/04/2014 0726   CO2 30 12/04/2014 0726   GLUCOSE 130* 12/04/2014 0726   BUN 60* 12/04/2014 0726   BUN 22* 11/26/2014   CREATININE 1.39* 12/04/2014 0726   CREATININE 0.8 11/26/2014   CALCIUM 8.7 12/04/2014 0726   GFRNONAA 41* 12/04/2014 0726   GFRAA 48* 12/04/2014 0726    CMP     Component Value Date/Time   NA 149* 12/04/2014 0726   NA 139 11/26/2014   K 3.2* 12/04/2014 3716  CL 110 12/04/2014 0726   CO2 30 12/04/2014 0726   GLUCOSE 130* 12/04/2014 0726   BUN 60* 12/04/2014 0726   BUN 22* 11/26/2014   CREATININE 1.39* 12/04/2014 0726   CREATININE 0.8 11/26/2014   CALCIUM 8.7 12/04/2014 0726   PROT 7.5 11/28/2014 0458   ALBUMIN 2.6* 11/28/2014 0458   AST 16 11/28/2014 0458   ALT 8 11/28/2014 0458   ALKPHOS 58 11/28/2014 0458   BILITOT 0.7 11/28/2014 0458   GFRNONAA 41* 12/04/2014 0726   GFRAA 48* 12/04/2014 0726    Patient Documents Completed or Given: Document Given Completed  Advanced Directives Pkt    MOST X   DNR    Gone from My Sight    Hard Choices X     Time In Time Out Total Time Spent with Patient Total Overall Time  0900 0935 35 min 35 min    Greater than 50%  of this time was spent counseling and coordinating care related to the above assessment and plan.  Wadie Lessen NP  Palliative Medicine Team Team Phone #  [phone] Pager [phone]  Discussed with RNCM 1

## 2014-12-04 NOTE — Progress Notes (Signed)
[name] to be D/C'd Skilled nursing facility: [facility] per MD order.  Discussed with the patient/family and all questions fully answered.  VSS. Patient has DNR in place and will be full comfort care.    IV catheter discontinued intact. Site without signs and symptoms of complications. Dressing and pressure applied.  Social work Software engineer given to EMS and includes all prescriptions.  Patient transported via stretcher and will return to [facility] via ambulance.  Report called to RN at [facility].  Micki Riley 12/04/2014 3:30 PM

## 2014-12-04 NOTE — Care Management Note (Signed)
  Page 1 of 1   12/04/2014     2:16:46 PM CARE MANAGEMENT NOTE 12/04/2014  Patient:  Nicholas Hood, Nicholas Hood   Account Number:  [id number]  Date Initiated:  12/01/2014  Documentation initiated by:  Marvetta Gibbons  Subjective/Objective Assessment:   Pt admitted with HCAP     Action/Plan:   PTA pt lived at [facility]   Anticipated DC Date:  12/04/2014   Anticipated DC Plan:  [place]  In-house referral  Clinical Social Worker      DC Planning Services  CM consult      Choice offered to / List presented to:             Status of service:  In process, will continue to follow Medicare Important Message given?  YES (If response is "NO", the following Medicare IM given date fields will be blank) Date Medicare IM given:  12/01/2014 Medicare IM given by:  Marvetta Gibbons Date Additional Medicare IM given:  12/04/2014 Additional Medicare IM given by:  Magdalen Spatz  Discharge Disposition:    Per UR Regulation:  Reviewed for med. necessity/level of care/duration of stay  If discussed at [place] of Stay Meetings, dates discussed:   12/03/2014    Comments:

## 2014-12-08 ENCOUNTER — Non-Acute Institutional Stay (SKILLED_NURSING_FACILITY): Payer: Medicare Other | Admitting: Nurse Practitioner

## 2014-12-08 ENCOUNTER — Encounter: Payer: Self-pay | Admitting: Nurse Practitioner

## 2014-12-08 DIAGNOSIS — I482 Chronic atrial fibrillation, unspecified: Secondary | ICD-10-CM

## 2014-12-08 DIAGNOSIS — I5022 Chronic systolic (congestive) heart failure: Secondary | ICD-10-CM

## 2014-12-08 DIAGNOSIS — J9600 Acute respiratory failure, unspecified whether with hypoxia or hypercapnia: Secondary | ICD-10-CM

## 2014-12-08 DIAGNOSIS — G9340 Encephalopathy, unspecified: Secondary | ICD-10-CM | POA: Insufficient documentation

## 2014-12-08 NOTE — Progress Notes (Signed)
Patient ID: Nicholas Hood, male   DOB: 12-06-1915, 81 y.o.   MRN: [id number]     Allergies  Allergen Reactions  . Accupril [Quinapril Hcl]     Per MAR  . Alacepril     Per MAR  . Altace [Ramipril]     Unknown reaction   . Hctz [Hydrochlorothiazide]     Unknown reaction  . Inderal [Propranolol]     Per MAR  . Mobic [Meloxicam]     Per MAR  . Monopril [Fosinopril]     Per MAR  . Quinapril Hcl     Unknown reaction  . Reserpine     Per [facility]    Chief Complaint  Patient presents with  . Medical Management of Chronic Issues  . Hospitalization Follow-up  . Acute Visit    end of life care    HPI: Patient is a 81 y.o. male seen in the SNF at [facility] for end of life care.      11/28/2014--12/04/2014-hospitalized for HCAP--Transfer to [place] for hospice car--MOST form filled out again with palliative care/DNR already complete  He was admitted and started on broad-spectrum antibiotics. Although initially, he was able to communicate in his normal way, quickly he lost the ability to communicate normally. He has been essentially nonverbal for several days. It was felt that he likely had a stroke causing him to have some aphasia as well as Cheyne-Stokes breathing. He developed dysphasia and was high risk for aspiration. Additionally, echocardiogram demonstrated severe congestive heart failure. He continued treatment for heart failure with fluid restriction and antibiotics for pneumonia and work repeatedly with therapy on his swallowing in his physical activity with progressive decline.          Hospitalized 03/14/2014-03/20/2014 for Delirium-acute resolved, gram-negative rods sepsis UTI urinary tract infection-complete 10 day course of Levaquin SNF.   Problem List Items Addressed This Visit    Chronic systolic congestive heart failure    ECHO demonstrates severe left ventricular dysfunction with EF of 30-35%, mod AS, more TR. comfort measures.       Atrial fibrillation   Comfort measures      Acute respiratory failure - Primary    Acute Hypoxic Respiratory Failure with Cheyne Stokes breathing. multifactorial possible due to PNA, bronchospasm, acute on chronic congestive heart failure, and/or stroke-q4h 5mg  roxanol for dyspnea        Acute encephalopathy    Haldol 2mg  q4h prn available to him.          Review of Systems:  Review of Systems  Constitutional: Negative for fever and chills.  Respiratory: Negative for cough and wheezing.   Cardiovascular: Negative for leg swelling.  Genitourinary:       Incontinent of bladder  Skin: Negative for itching and rash.  Neurological:       UTA  Psychiatric/Behavioral:       No agitation      Past Medical History  Diagnosis Date  . Hypertension   . Prostate cancer   . Spinal stenosis   . Peripheral neuropathy   . Diverticulosis   . Complete heart block     s/p PPM  . Vitamin D deficiency   . Paroxysmal atrial fibrillation   . Congestive heart failure, unspecified 03/05/2012  . Unspecified hypertrophic and atrophic condition of skin 01/16/2012  . Sebaceous cyst 01/16/2012  . Unspecified constipation 11/28/2011  . Other malaise and fatigue 01/28/2008  . Nonspecific abnormal electrocardiogram (ECG) (EKG) 01/28/2008  . Internal hemorrhoids  without mention of complication 6/80/3212  . Unspecified hearing loss 02/19/2007  . Osteoarthrosis, unspecified whether generalized or localized, unspecified site 02/19/2007  . Abnormality of gait 06/04/1999  . Aortic valve disorders 06/04/1979  . Cerebellar atrophy 03/20/14  . Cerebrovascular disease 03/20/14    small vessell disease  . Urinary tract infection, site not specified 03/14/14  . Delirium 03/14/14  . Sepsis 03/14/14  . Anemia of chronic disease 2014  . Dysphagia 2015  . Presence of permanent cardiac pacemaker    Past Surgical History  Procedure Laterality Date  . Pacemaker insertion  01-04-2010    STJ 2210-[id number] dual chamber pacemaker implanted by Dr Rayann Heman  for CHB  . Back surgery  2000    Spinal stenosis Dr. Achilles Dunk  . Nasal polyp excision    . Tonsillectomy    . Cataract extraction w/ intraocular lens  implant, bilateral  10/2009    Dr. Ellie Lunch  . Pacemaker placement  01/05/2010  . Colonoscopy  07/25/2007    int hem. diverticulosis  Dr. Lajoyce Corners   Social History:   reports that he quit smoking about 77 years ago. He has never used smokeless tobacco. He reports that he does not drink alcohol or use illicit drugs.  Family History  Problem Relation Age of Onset  . Coronary artery disease      family history  . Stroke Mother   . Heart disease Father   . Heart disease Brother   . Heart disease Brother   . Heart disease Brother   . Lung disease Brother     Medications: Patient's Medications  New Prescriptions   No medications on file  Previous Medications   ALBUTEROL (PROVENTIL) (2.5 MG/3ML) 0.083% NEBULIZER SOLUTION    Take 3 mLs (2.5 mg total) by nebulization every 2 (two) hours as needed for wheezing or shortness of breath.   BISACODYL (DULCOLAX) 10 MG SUPPOSITORY    Place 1 suppository (10 mg total) rectally daily as needed for mild constipation or moderate constipation.   LORAZEPAM (ATIVAN) 1 MG TABLET    Take 1 tablet (1 mg total) by mouth every 4 (four) hours as needed for anxiety.   MORPHINE SULFATE (MORPHINE CONCENTRATE) 10 MG/0.5ML SOLN CONCENTRATED SOLUTION    Take 0.25 mLs (5 mg total) by mouth every hour as needed for moderate pain, severe pain or shortness of breath.  Modified Medications   No medications on file  Discontinued Medications   No medications on file     Physical Exam: Physical Exam  Constitutional: He is oriented to person, place, and time. He appears well-developed and well-nourished. No distress.  HENT:  Head: Normocephalic and atraumatic.  Right Ear: External ear normal.  Left Ear: External ear normal.  Nose: Nose normal.  Mouth/Throat: Oropharynx is clear and moist. No oropharyngeal exudate.  Eyes:  Conjunctivae and EOM are normal. Right eye exhibits no discharge. Left eye exhibits no discharge. No scleral icterus.  Neck: Normal range of motion. Neck supple. No JVD present. No tracheal deviation present. No thyromegaly present.  Cardiovascular: Normal rate and intact distal pulses.   Murmur heard. A-fib. EM 3/6  Pulmonary/Chest: Effort normal. No stridor. No respiratory distress. He has no wheezes. He has no rales. He exhibits no tenderness.  Posterior mid to lower lungs dry rales.   Abdominal: Soft. Bowel sounds are normal. He exhibits no distension. There is no tenderness. There is no rebound and no guarding.  Musculoskeletal: Normal range of motion. He exhibits no edema or tenderness.  Lymphadenopathy:    He has no cervical adenopathy.  Neurological: He is alert and oriented to person, place, and time. He has normal reflexes. No cranial nerve deficit. He exhibits abnormal muscle tone. Coordination normal.  Unresponsive.   Skin: Skin is dry. No rash noted. He is not diaphoretic. No erythema. No pallor.  Psychiatric: He has a normal mood and affect. His behavior is normal. Judgment and thought content normal.  No agitation or restlessness.     Filed Vitals:   12/08/14 1434  BP: 88/50  Pulse: 72  Temp: 99.6 F (37.6 C)  TempSrc: Tympanic  Resp: 18      Labs reviewed: Basic Metabolic Panel:  Recent Labs  03/13/14  12/02/14 0030 12/03/14 0327 12/04/14 0726  NA 138  < > 146* 148* 149*  K 4.3  < > 3.5 3.5 3.2*  CL  --   < > 107 111 110  CO2  --   < > 32 28 30  GLUCOSE  --   < > 162* 159* 130*  BUN 29*  < > 65* 67* 60*  CREATININE 1.0  < > 1.59* 1.43* 1.39*  CALCIUM  --   < > 8.7 8.6 8.7  TSH 1.08  --   --   --   --   < > = values in this interval not displayed. Liver Function Tests:  Recent Labs  03/14/14 1800  05/18/14 11/26/14 11/28/14 0458  AST 21  < > 15 9* 16  ALT 17  < > 11 8* 8  ALKPHOS 59  < > 55 54 58  BILITOT 0.3  --   --   --  0.7  PROT 7.1  --    --   --  7.5  ALBUMIN 2.3*  --   --   --  2.6*  < > = values in this interval not displayed. CBC:  Recent Labs  03/14/14 1800  11/28/14 0458  12/02/14 0030 12/03/14 0327 12/04/14 0726  WBC 13.3*  < > 13.1*  < > 10.6* 11.2* 14.6*  NEUTROABS 11.2*  --  10.6*  --   --   --   --   HGB 9.8*  < > 11.7*  < > 12.7* 13.1 14.6  HCT 30.5*  < > 36.2*  < > 39.7 41.8 46.3  MCV 93.6  < > 94.3  < > 95.9 94.8 96.5  PLT 207  < > 185  < > 201 194 193  < > = values in this interval not displayed.   Past Procedures:  05/18/14 CXR bibasilar atelectasis changes, negative for focal pneumonia, Cardiomegaly without evident heart failure findings.   08/12/14 US bladder: very large prostate, no massive distension of the urinary bladder is present, 6.4cmx6.4cmx7.1cm  Assessment/Plan Acute respiratory failure Acute Hypoxic Respiratory Failure with Cheyne Stokes breathing. multifactorial possible due to PNA, bronchospasm, acute on chronic congestive heart failure, and/or stroke-q4h 5mg  roxanol for dyspnea     Chronic systolic congestive heart failure ECHO demonstrates severe left ventricular dysfunction with EF of 30-35%, mod AS, more TR. comfort measures.    Atrial fibrillation Comfort measures   Acute encephalopathy Haldol 2mg  q4h prn available to him.      Family/ Staff Communication: observe the patient.   Goals of Care: SNF Hospice  Labs/tests ordered: none

## 2014-12-08 NOTE — Assessment & Plan Note (Signed)
Haldol 2mg  q4h prn available to him.

## 2014-12-08 NOTE — Assessment & Plan Note (Addendum)
Acute Hypoxic Respiratory Failure with Cheyne Stokes breathing. multifactorial possible due to PNA, bronchospasm, acute on chronic congestive heart failure, and/or stroke-q4h 5mg  roxanol for dyspnea

## 2014-12-08 NOTE — Assessment & Plan Note (Signed)
ECHO demonstrates severe left ventricular dysfunction with EF of 30-35%, mod AS, more TR. comfort measures.

## 2014-12-08 NOTE — Assessment & Plan Note (Signed)
Comfort measures

## 2014-12-21 ENCOUNTER — Encounter: Payer: Medicare Other | Admitting: Internal Medicine

## 2014-12-22 DEATH — deceased

## 2015-04-24 IMAGING — CR DG CHEST 1V PORT
1 series · 1 of 1 positions shown · non-contrast
Comparison: 01/06/2010

CLINICAL DATA: [AGE] male with fever and cough.

EXAM:
PORTABLE CHEST - 1 VIEW

[AP]
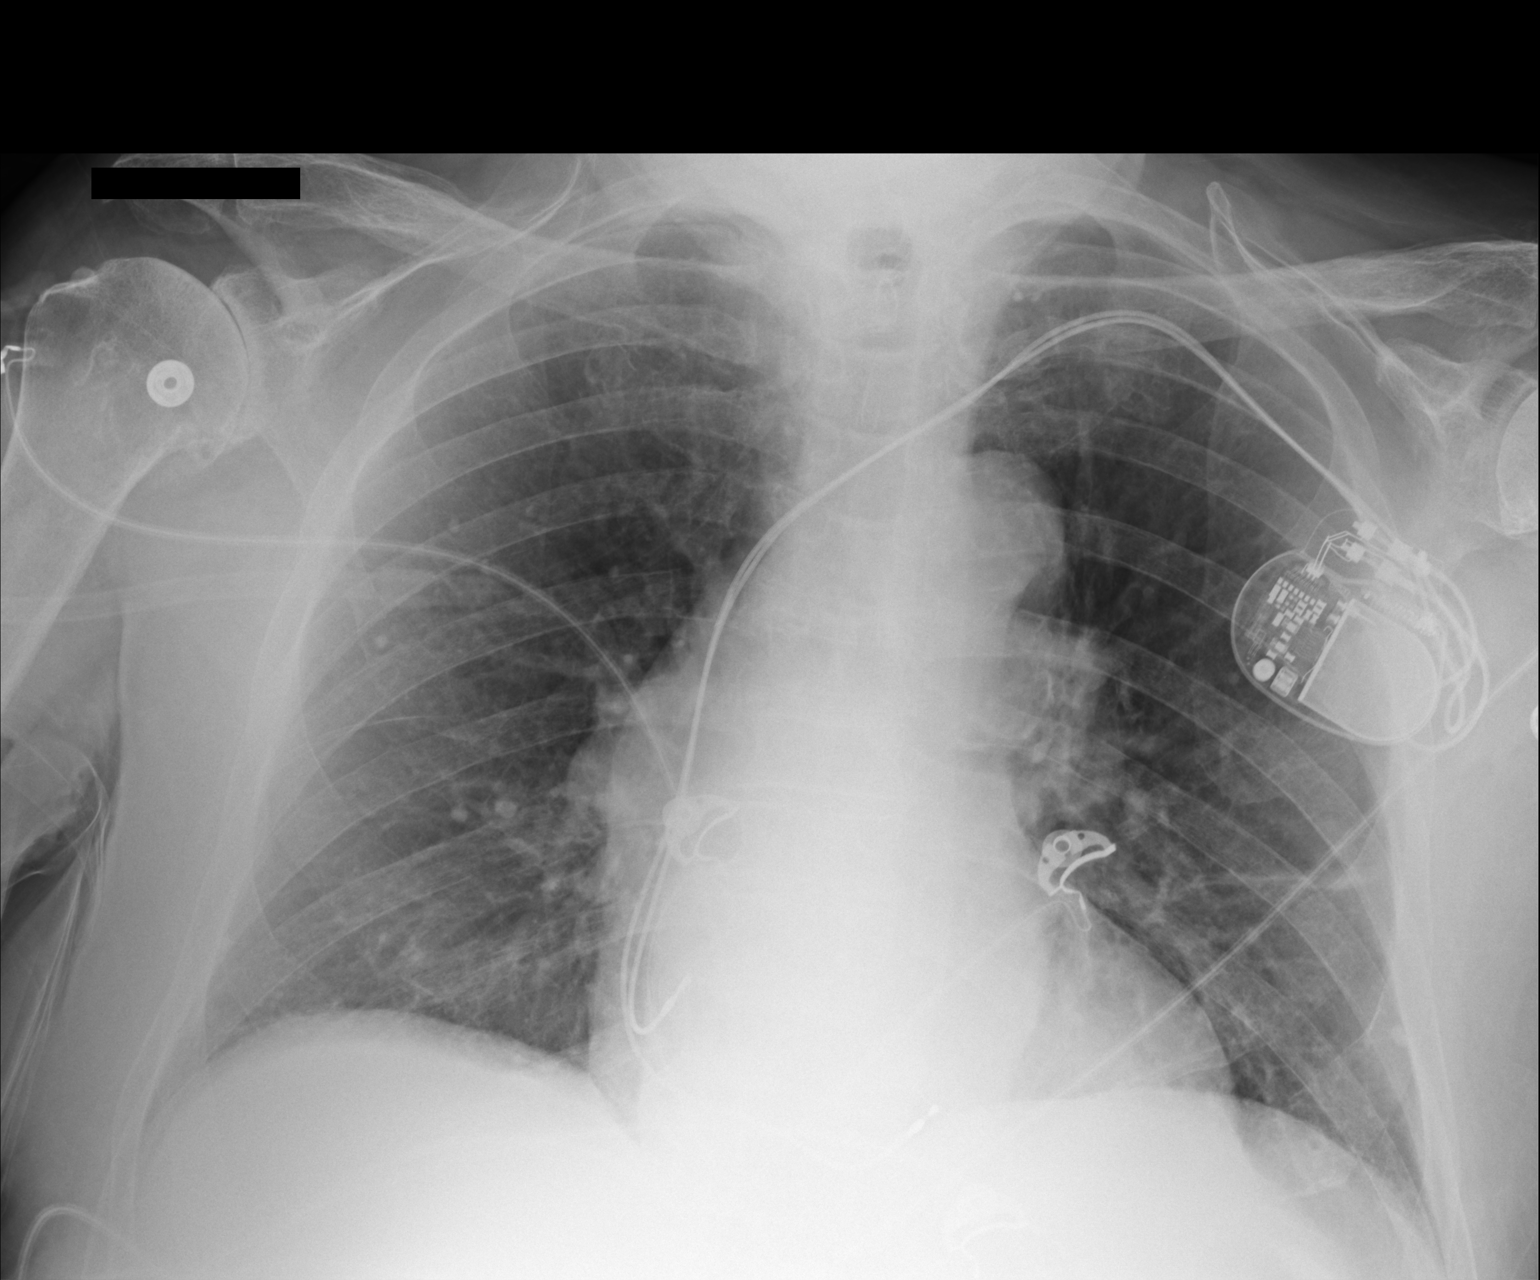

[1 of 1 positions shown; findings below may reference images not displayed]

FINDINGS: Mild cardiomegaly and left-sided pacemaker again noted.

There is no evidence of focal airspace disease, pulmonary edema,
suspicious pulmonary nodule/mass, pleural effusion, or pneumothorax.
No acute bony abnormalities are identified.
IMPRESSION: No active disease.
